# Patient Record
Sex: Female | Born: 1955 | Race: White | Hispanic: No | Marital: Married | Smoking: Former smoker
Health system: Southern US, Community
[De-identification: ages and names within clinical notes are randomized; demographics above are authoritative.]

## PROBLEM LIST (undated history)

## (undated) DIAGNOSIS — K259 Gastric ulcer, unspecified as acute or chronic, without hemorrhage or perforation: Secondary | ICD-10-CM

## (undated) DIAGNOSIS — R160 Hepatomegaly, not elsewhere classified: Secondary | ICD-10-CM

## (undated) DIAGNOSIS — J189 Pneumonia, unspecified organism: Secondary | ICD-10-CM

## (undated) DIAGNOSIS — F32A Depression, unspecified: Secondary | ICD-10-CM

## (undated) DIAGNOSIS — F1910 Other psychoactive substance abuse, uncomplicated: Secondary | ICD-10-CM

## (undated) DIAGNOSIS — F419 Anxiety disorder, unspecified: Secondary | ICD-10-CM

## (undated) DIAGNOSIS — K219 Gastro-esophageal reflux disease without esophagitis: Secondary | ICD-10-CM

## (undated) DIAGNOSIS — K469 Unspecified abdominal hernia without obstruction or gangrene: Secondary | ICD-10-CM

## (undated) DIAGNOSIS — R011 Cardiac murmur, unspecified: Secondary | ICD-10-CM

## (undated) DIAGNOSIS — Z87442 Personal history of urinary calculi: Secondary | ICD-10-CM

## (undated) DIAGNOSIS — Z8719 Personal history of other diseases of the digestive system: Secondary | ICD-10-CM

## (undated) DIAGNOSIS — M1990 Unspecified osteoarthritis, unspecified site: Secondary | ICD-10-CM

## (undated) DIAGNOSIS — F988 Other specified behavioral and emotional disorders with onset usually occurring in childhood and adolescence: Secondary | ICD-10-CM

## (undated) DIAGNOSIS — I10 Essential (primary) hypertension: Secondary | ICD-10-CM

## (undated) DIAGNOSIS — F329 Major depressive disorder, single episode, unspecified: Secondary | ICD-10-CM

## (undated) DIAGNOSIS — K5790 Diverticulosis of intestine, part unspecified, without perforation or abscess without bleeding: Secondary | ICD-10-CM

## (undated) HISTORY — PX: EYE SURGERY: SHX253

## (undated) HISTORY — DX: Depression, unspecified: F32.A

## (undated) HISTORY — DX: Other psychoactive substance abuse, uncomplicated: F19.10

## (undated) HISTORY — PX: ECTOPIC PREGNANCY SURGERY: SHX613

## (undated) HISTORY — DX: Cardiac murmur, unspecified: R01.1

## (undated) HISTORY — DX: Hepatomegaly, not elsewhere classified: R16.0

## (undated) HISTORY — PX: CHOLECYSTECTOMY: SHX55

## (undated) HISTORY — PX: ABDOMINAL HYSTERECTOMY: SHX81

## (undated) HISTORY — DX: Other specified behavioral and emotional disorders with onset usually occurring in childhood and adolescence: F98.8

## (undated) HISTORY — DX: Major depressive disorder, single episode, unspecified: F32.9

---

## 2006-03-09 ENCOUNTER — Other Ambulatory Visit: Admission: RE | Admit: 2006-03-09 | Discharge: 2006-03-09 | Payer: Self-pay | Admitting: Family Medicine

## 2011-07-09 ENCOUNTER — Encounter: Payer: Self-pay | Admitting: Internal Medicine

## 2011-07-23 ENCOUNTER — Encounter: Payer: Self-pay | Admitting: Internal Medicine

## 2011-07-24 ENCOUNTER — Ambulatory Visit: Payer: Self-pay | Admitting: Internal Medicine

## 2012-09-01 ENCOUNTER — Telehealth: Payer: Self-pay | Admitting: Nurse Practitioner

## 2012-09-01 NOTE — Telephone Encounter (Signed)
PLEASE ADVISE.

## 2012-09-01 NOTE — Telephone Encounter (Signed)
Need chart

## 2012-09-02 MED ORDER — METHYLPHENIDATE HCL ER (OSM) 36 MG PO TBCR: 36.0000 mg | EXTENDED_RELEASE_TABLET | ORAL | Status: DC

## 2012-09-02 NOTE — Telephone Encounter (Signed)
Chart on desk

## 2012-09-02 NOTE — Telephone Encounter (Signed)
Rx ready for pick up. 

## 2012-10-03 ENCOUNTER — Telehealth: Payer: Self-pay | Admitting: Nurse Practitioner

## 2012-10-04 MED ORDER — METHYLPHENIDATE HCL ER (OSM) 54 MG PO TBCR: 54.0000 mg | EXTENDED_RELEASE_TABLET | ORAL | Status: DC

## 2012-10-04 NOTE — Telephone Encounter (Signed)
Patient aware to pick up RX 

## 2012-10-04 NOTE — Telephone Encounter (Signed)
i cant find anywhere that says 54mg . Sent chart back

## 2012-11-07 ENCOUNTER — Telehealth: Payer: Self-pay | Admitting: Nurse Practitioner

## 2012-11-08 ENCOUNTER — Other Ambulatory Visit: Payer: Self-pay | Admitting: *Deleted

## 2012-11-08 MED ORDER — METHYLPHENIDATE HCL ER (OSM) 54 MG PO TBCR: 54.0000 mg | EXTENDED_RELEASE_TABLET | ORAL | Status: DC

## 2012-11-08 NOTE — Telephone Encounter (Signed)
Patient aware to pick up 

## 2012-11-08 NOTE — Telephone Encounter (Signed)
Last filled 10/04/12, last seen 0124/14, Chart says 36mg , request is for 54mg .

## 2012-11-08 NOTE — Telephone Encounter (Signed)
rx ready for pickup 

## 2012-11-14 ENCOUNTER — Telehealth: Payer: Self-pay | Admitting: Nurse Practitioner

## 2012-11-15 ENCOUNTER — Ambulatory Visit (INDEPENDENT_AMBULATORY_CARE_PROVIDER_SITE_OTHER): Payer: No Typology Code available for payment source | Admitting: Nurse Practitioner

## 2012-11-15 ENCOUNTER — Encounter: Payer: Self-pay | Admitting: Nurse Practitioner

## 2012-11-15 VITALS — BP 155/89 | HR 63 | Temp 98.5°F | Ht 65.25 in | Wt 156.0 lb

## 2012-11-15 DIAGNOSIS — N390 Urinary tract infection, site not specified: Secondary | ICD-10-CM

## 2012-11-15 DIAGNOSIS — F909 Attention-deficit hyperactivity disorder, unspecified type: Secondary | ICD-10-CM | POA: Insufficient documentation

## 2012-11-15 DIAGNOSIS — G510 Bell's palsy: Secondary | ICD-10-CM

## 2012-11-15 LAB — POCT UA - MICROSCOPIC ONLY: Yeast, UA: NEGATIVE

## 2012-11-15 LAB — POCT URINALYSIS DIPSTICK
Glucose, UA: NEGATIVE
Nitrite, UA: NEGATIVE
Urobilinogen, UA: NEGATIVE

## 2012-11-15 MED ORDER — METHYLPREDNISOLONE ACETATE 80 MG/ML IJ SUSP
80.0000 mg | Freq: Once | INTRAMUSCULAR | Status: AC
  Administered 2012-11-15: 80 mg via INTRAMUSCULAR

## 2012-11-15 MED ORDER — CIPROFLOXACIN HCL 500 MG PO TABS: 500.0000 mg | ORAL_TABLET | Freq: Two times a day (BID) | ORAL | Status: DC

## 2012-11-15 NOTE — Patient Instructions (Signed)
Bell's Palsy  Bell's palsy is a condition in which the muscles on one side of the face cannot move (paralysis). This is because the nerves in the face are paralyzed. It is most often thought to be caused by a virus. The virus causes swelling of the nerve that controls movement on one side of the face. The nerve travels through a tight space surrounded by bone. When the nerve swells, it can be compressed by the bone. This results in damage to the protective covering around the nerve. This damage interferes with how the nerve communicates with the muscles of the face. As a result, it can cause weakness or paralysis of the facial muscles.   Injury (trauma), tumor, and surgery may cause Bell's palsy, but most of the time the cause is unknown. It is a relatively common condition. It starts suddenly (abrupt onset) with the paralysis usually ending within 2 days. Bell's palsy is not dangerous. But because the eye does not close properly, you may need care to keep the eye from getting dry. This can include splinting (to keep the eye shut) or moistening with artificial tears. Bell's palsy very seldom occurs on both sides of the face at the same time.  SYMPTOMS    Eyebrow sagging.   Drooping of the eyelid and corner of the mouth.   Inability to close one eye.   Loss of taste on the front of the tongue.   Sensitivity to loud noises.  TREATMENT   The treatment is usually non-surgical. If the patient is seen within the first 24 to 48 hours, a short course of steroids may be prescribed, in an attempt to shorten the length of the condition. Antiviral medicines may also be used with the steroids, but it is unclear if they are helpful.   You will need to protect your eye, if you cannot close it. The cornea (clear covering over your eye) will become dry and can be damaged. Artificial tears can be used to keep your eye moist. Glasses or an eye patch should be worn to protect your eye.  PROGNOSIS   Recovery is variable, ranging  from days to months. Although the problem usually goes away completely (about 80% of cases resolve), predicting the outcome is impossible. Most people improve within 3 weeks of when the symptoms began. Improvement may continue for 3 to 6 months. A small number of people have moderate to severe weakness that is permanent.   HOME CARE INSTRUCTIONS    If your caregiver prescribed medication to reduce swelling in the nerve, use as directed. Do not stop taking the medication unless directed by your caregiver.   Use moisturizing eye drops as needed to prevent drying of your eye, as directed by your caregiver.   Protect your eye, as directed by your caregiver.   Use facial massage and exercises, as directed by your caregiver.   Perform your normal activities, and get your normal rest.  SEEK IMMEDIATE MEDICAL CARE IF:    There is pain, redness or irritation in the eye.   You or your child has an oral temperature above 102 F (38.9 C), not controlled by medicine.  MAKE SURE YOU:    Understand these instructions.   Will watch your condition.   Will get help right away if you are not doing well or get worse.  Document Released: 05/11/2005 Document Revised: 08/03/2011 Document Reviewed: 05/20/2009  Exit[employer] Patient Information 2014 [employer].

## 2012-11-15 NOTE — Progress Notes (Signed)
  Subjective:    Patient ID: Theresa Reyes, female    DOB: 1955/10/16, 57 y.o.   MRN: [id number]  HPI1. PAtient here today C/O dysuria and urinary frequency an durgecy- Started Sunday and has gradually gotten worse- Using AZO OTC which has helped. 2 Facial drooping on right side.- No left sided weakness- noticed this yesterday.   Review of Systems  Genitourinary: Positive for dysuria, urgency and frequency.  Neurological: Positive for facial asymmetry. Negative for tremors, weakness and numbness.       Objective:   Physical Exam  Constitutional: She is oriented to person, place, and time. She appears well-developed and well-nourished.  Cardiovascular: Normal rate and normal heart sounds.   Pulmonary/Chest: Effort normal and breath sounds normal.  Neurological: She is alert and oriented to person, place, and time. She has normal reflexes. A cranial nerve deficit (right lip drooping) is present.  Psychiatric: She has a normal mood and affect. Her behavior is normal. Judgment and thought content normal.   BP 155/89  Pulse 63  Temp(Src) 98.5 F (36.9 C) (Oral)  Ht 5' 5.25" (1.657 m)  Wt 156 lb (70.761 kg)  BMI 25.77 kg/m2  Results for orders placed in visit on 11/15/12  POCT URINALYSIS DIPSTICK      Result Value Range   Color, UA gold     Clarity, UA clear     Glucose, UA neg     Bilirubin, UA neg     Ketones, UA neg     Spec Grav, UA 1.010     Blood, UA large     pH, UA 6.0     Protein, UA 1+     Urobilinogen, UA negative     Nitrite, UA neg     Leukocytes, UA Trace    POCT UA - MICROSCOPIC ONLY      Result Value Range   WBC, Ur, HPF, POC 5-10     RBC, urine, microscopic 1-5     Bacteria, U Microscopic few     Mucus, UA neg     Epithelial cells, urine per micros occ     Crystals, Ur, HPF, POC neg     Casts, Ur, LPF, POC neg     Yeast, UA neg           Assessment & Plan:  1. UTI (urinary tract infection) Force fluids AZO over the counter X2 days RTO  prn Culture pending  - POCT urinalysis dipstick - POCT UA - Microscopic Only - ciprofloxacin (CIPRO) 500 MG tablet; Take 1 tablet (500 mg total) by mouth 2 (two) times daily.  Dispense: 14 tablet; Refill: 0  2. Bell's palsy Watch for worsening - methylPREDNISolone acetate (DEPO-MEDROL) injection 80 mg; Inject 1 mL (80 mg total) into the muscle once.  Mary-Margaret Daphine Deutscher, FNP

## 2012-11-24 ENCOUNTER — Other Ambulatory Visit: Payer: Self-pay | Admitting: Nurse Practitioner

## 2012-11-24 NOTE — Telephone Encounter (Signed)
Patient last seen 6-24 by MMM. Please advise. If approved please have nurse phone in to pharmacy

## 2012-11-28 ENCOUNTER — Other Ambulatory Visit: Payer: Self-pay | Admitting: Family Medicine

## 2012-11-28 MED ORDER — LORAZEPAM 0.5 MG PO TABS: 0.5000 mg | ORAL_TABLET | Freq: Three times a day (TID) | ORAL | Status: DC | PRN

## 2012-11-28 NOTE — Telephone Encounter (Signed)
Ativan refilled and will need to follow up in 3 months prior to needing another refill.

## 2012-12-13 ENCOUNTER — Telehealth: Payer: Self-pay | Admitting: Nurse Practitioner

## 2012-12-15 ENCOUNTER — Other Ambulatory Visit: Payer: Self-pay

## 2012-12-15 MED ORDER — METHYLPHENIDATE HCL ER (OSM) 54 MG PO TBCR: 54.0000 mg | EXTENDED_RELEASE_TABLET | ORAL | Status: DC

## 2012-12-15 NOTE — Telephone Encounter (Signed)
Last seen 11/15/12 MM  Last filled 11/08/12    If approved print and route to nurse

## 2013-02-08 ENCOUNTER — Other Ambulatory Visit: Payer: Self-pay | Admitting: Nurse Practitioner

## 2013-02-08 MED ORDER — METHYLPHENIDATE HCL ER (OSM) 54 MG PO TBCR: 54.0000 mg | EXTENDED_RELEASE_TABLET | ORAL | Status: DC

## 2013-02-08 NOTE — Telephone Encounter (Signed)
Last filled 12/18/12, last seen 11/15/12, will print call when ready

## 2013-02-08 NOTE — Telephone Encounter (Signed)
rx ready for pickup 

## 2013-06-15 ENCOUNTER — Other Ambulatory Visit: Payer: Self-pay | Admitting: Family Medicine

## 2013-06-19 ENCOUNTER — Other Ambulatory Visit: Payer: Self-pay | Admitting: Family Medicine

## 2013-06-19 NOTE — Telephone Encounter (Signed)
Please call in lorazepam.  

## 2013-06-19 NOTE — Telephone Encounter (Signed)
Called to [facility] 

## 2013-06-19 NOTE — Telephone Encounter (Signed)
Patient last seen in office on 11-15-12. Please advise. If approved please route to Pool B so nurse can phone in to pharmacy

## 2013-06-21 ENCOUNTER — Telehealth: Payer: Self-pay | Admitting: Nurse Practitioner

## 2013-06-21 NOTE — Telephone Encounter (Signed)
Called to [facility] with directions.

## 2013-06-22 ENCOUNTER — Other Ambulatory Visit: Payer: Self-pay | Admitting: Family Medicine

## 2013-06-29 ENCOUNTER — Encounter: Payer: Self-pay | Admitting: Nurse Practitioner

## 2013-06-29 ENCOUNTER — Ambulatory Visit: Payer: No Typology Code available for payment source | Admitting: Nurse Practitioner

## 2013-06-29 VITALS — BP 172/94 | HR 74 | Temp 97.0°F | Wt 149.0 lb

## 2013-06-29 DIAGNOSIS — K2970 Gastritis, unspecified, without bleeding: Secondary | ICD-10-CM

## 2013-06-29 DIAGNOSIS — F329 Major depressive disorder, single episode, unspecified: Secondary | ICD-10-CM

## 2013-06-29 DIAGNOSIS — F32A Depression, unspecified: Secondary | ICD-10-CM

## 2013-06-29 DIAGNOSIS — K2990 Gastroduodenitis, unspecified, without bleeding: Secondary | ICD-10-CM

## 2013-06-29 DIAGNOSIS — F3289 Other specified depressive episodes: Secondary | ICD-10-CM

## 2013-06-29 MED ORDER — OMEPRAZOLE 40 MG PO CPDR: 40.0000 mg | DELAYED_RELEASE_CAPSULE | Freq: Every day | ORAL | Status: DC

## 2013-06-29 MED ORDER — ESCITALOPRAM OXALATE 20 MG PO TABS: 20.0000 mg | ORAL_TABLET | Freq: Every day | ORAL | Status: DC

## 2013-06-29 NOTE — Progress Notes (Signed)
   Subjective:    Patient ID: Theresa Reyes, female    DOB: 30-Apr-1956, 58 y.o.   MRN: [id number]  HPI  Patient mom passed away in February 19, 2023 and patient quit her job to take mom home to care for her . Now that she has died patient has grieved herself to death. SHe cries all the time and feels that she has no purpose in life. SHe has also developed abdominal pain and vomiting daily- vomitus looks foamy. Weight down over 30 lbs. SHe started taking nexium 1 week ago.    Review of Systems  Constitutional: Negative.   HENT: Negative.   Respiratory: Negative.   Cardiovascular: Negative.   Gastrointestinal: Negative.   Genitourinary: Negative.   Psychiatric/Behavioral: Negative.   All other systems reviewed and are negative.       Objective:   Physical Exam  Constitutional: She appears well-developed and well-nourished.  Cardiovascular: Normal rate, regular rhythm and normal heart sounds.   Pulmonary/Chest: Effort normal and breath sounds normal.  Abdominal: Soft.  Psychiatric: Her speech is normal and behavior is normal. Judgment and thought content normal. Her mood appears anxious. Cognition and memory are normal.  Tearful throughout exam   BP 172/94  Pulse 74  Temp(Src) 97 F (36.1 C) (Oral)  Wt 149 lb (67.586 kg)        Assessment & Plan:  1. Depression Stress management Grief counseling encouraged Follow up in 3 weeks - escitalopram (LEXAPRO) 20 MG tablet; Take 1 tablet (20 mg total) by mouth daily.  Dispense: 30 tablet; Refill: 5  2. Gastritis Avoid spicy and fatty foods - omeprazole (PRILOSEC) 40 MG capsule; Take 1 capsule (40 mg total) by mouth daily.  Dispense: 30 capsule; Refill: [place], FNP

## 2013-06-29 NOTE — Patient Instructions (Addendum)
Stress Management Stress is a state of physical or mental tension that often results from changes in your life or normal routine. Some common causes of stress are:  Death of a loved one.  Injuries or severe illnesses.  Getting fired or changing jobs.  Moving into a new home. Other causes may be:  Sexual problems.  Business or financial losses.  Taking on a large debt.  Regular conflict with someone at home or at work.  Constant tiredness from lack of sleep. It is not just bad things that are stressful. It may be stressful to:  Win the lottery.  Get married.  Buy a new car. The amount of stress that can be easily tolerated varies from person to person. Changes generally cause stress, regardless of the types of change. Too much stress can affect your health. It may lead to physical or emotional problems. Too little stress (boredom) may also become stressful. SUGGESTIONS TO REDUCE STRESS:  Talk things over with your family and friends. It often is helpful to share your concerns and worries. If you feel your problem is serious, you may want to get help from a professional counselor.  Consider your problems one at a time instead of lumping them all together. Trying to take care of everything at once may seem impossible. List all the things you need to do and then start with the most important one. Set a goal to accomplish 2 or 3 things each day. If you expect to do too many in a single day you will naturally fail, causing you to feel even more stressed.  Do not use alcohol or drugs to relieve stress. Although you may feel better for a short time, they do not remove the problems that caused the stress. They can also be habit forming.  Exercise regularly - at least 3 times per week. Physical exercise can help to relieve that "uptight" feeling and will relax you.  The shortest distance between despair and hope is often a good night's sleep.  Go to bed and get up on time allowing  yourself time for appointments without being rushed.  Take a short "time-out" period from any stressful situation that occurs during the day. Close your eyes and take some deep breaths. Starting with the muscles in your face, tense them, hold it for a few seconds, then relax. Repeat this with the muscles in your neck, shoulders, hand, stomach, back and legs.  Take good care of yourself. Eat a balanced diet and get plenty of rest.  Schedule time for having fun. Take a break from your daily routine to relax. HOME CARE INSTRUCTIONS   Call if you feel overwhelmed by your problems and feel you can no longer manage them on your own.  Return immediately if you feel like hurting yourself or someone else. Document Released: 11/04/2000 Document Revised: 08/03/2011 Document Reviewed: 01/03/2013 [employer] Patient Information 2014 [employer].  

## 2013-07-04 ENCOUNTER — Emergency Department (HOSPITAL_COMMUNITY): Payer: Self-pay

## 2013-07-04 ENCOUNTER — Encounter (HOSPITAL_COMMUNITY): Payer: Self-pay | Admitting: Emergency Medicine

## 2013-07-04 ENCOUNTER — Emergency Department (HOSPITAL_COMMUNITY)
Admission: EM | Admit: 2013-07-04 | Discharge: 2013-07-04 | Disposition: A | Discharge status: Home / Self Care | Payer: Self-pay | Attending: Emergency Medicine | Admitting: Emergency Medicine

## 2013-07-04 DIAGNOSIS — F17200 Nicotine dependence, unspecified, uncomplicated: Secondary | ICD-10-CM | POA: Insufficient documentation

## 2013-07-04 DIAGNOSIS — R112 Nausea with vomiting, unspecified: Secondary | ICD-10-CM | POA: Insufficient documentation

## 2013-07-04 DIAGNOSIS — R1013 Epigastric pain: Secondary | ICD-10-CM | POA: Insufficient documentation

## 2013-07-04 DIAGNOSIS — Z79899 Other long term (current) drug therapy: Secondary | ICD-10-CM | POA: Insufficient documentation

## 2013-07-04 DIAGNOSIS — R6883 Chills (without fever): Secondary | ICD-10-CM | POA: Insufficient documentation

## 2013-07-04 DIAGNOSIS — F329 Major depressive disorder, single episode, unspecified: Secondary | ICD-10-CM | POA: Insufficient documentation

## 2013-07-04 DIAGNOSIS — R1310 Dysphagia, unspecified: Secondary | ICD-10-CM | POA: Insufficient documentation

## 2013-07-04 DIAGNOSIS — R197 Diarrhea, unspecified: Secondary | ICD-10-CM | POA: Insufficient documentation

## 2013-07-04 DIAGNOSIS — F3289 Other specified depressive episodes: Secondary | ICD-10-CM | POA: Insufficient documentation

## 2013-07-04 DIAGNOSIS — K5900 Constipation, unspecified: Secondary | ICD-10-CM | POA: Insufficient documentation

## 2013-07-04 LAB — CBC WITH DIFFERENTIAL/PLATELET
BASOS ABS: 0 10*3/uL (ref 0.0–0.1)
Basophils Relative: 0 % (ref 0–1)
EOS PCT: 0 % (ref 0–5)
Eosinophils Absolute: 0 10*3/uL (ref 0.0–0.7)
HCT: 41.9 % (ref 36.0–46.0)
Hemoglobin: 14.5 g/dL (ref 12.0–15.0)
LYMPHS ABS: 2.4 10*3/uL (ref 0.7–4.0)
Lymphocytes Relative: 34 % (ref 12–46)
MCH: 30 pg (ref 26.0–34.0)
MCHC: 34.6 g/dL (ref 30.0–36.0)
MCV: 86.7 fL (ref 78.0–100.0)
MONO ABS: 0.3 10*3/uL (ref 0.1–1.0)
Monocytes Relative: 5 % (ref 3–12)
Neutro Abs: 4.3 10*3/uL (ref 1.7–7.7)
Neutrophils Relative %: 61 % (ref 43–77)
PLATELETS: 277 10*3/uL (ref 150–400)
RBC: 4.83 MIL/uL (ref 3.87–5.11)
RDW: 12.9 % (ref 11.5–15.5)
WBC: 7 10*3/uL (ref 4.0–10.5)

## 2013-07-04 LAB — COMPREHENSIVE METABOLIC PANEL
ALT: 7 U/L (ref 0–35)
AST: 12 U/L (ref 0–37)
Albumin: 4 g/dL (ref 3.5–5.2)
Alkaline Phosphatase: 51 U/L (ref 39–117)
BUN: 9 mg/dL (ref 6–23)
CALCIUM: 9.3 mg/dL (ref 8.4–10.5)
CO2: 26 meq/L (ref 19–32)
CREATININE: 0.79 mg/dL (ref 0.50–1.10)
Chloride: 105 mEq/L (ref 96–112)
GLUCOSE: 95 mg/dL (ref 70–99)
Potassium: 4.3 mEq/L (ref 3.7–5.3)
Sodium: 143 mEq/L (ref 137–147)
TOTAL PROTEIN: 7.4 g/dL (ref 6.0–8.3)
Total Bilirubin: 0.4 mg/dL (ref 0.3–1.2)

## 2013-07-04 LAB — URINALYSIS, ROUTINE W REFLEX MICROSCOPIC
Bilirubin Urine: NEGATIVE
Glucose, UA: NEGATIVE mg/dL
Ketones, ur: 15 mg/dL — AB
LEUKOCYTES UA: NEGATIVE
NITRITE: NEGATIVE
PROTEIN: NEGATIVE mg/dL
SPECIFIC GRAVITY, URINE: 1.013 (ref 1.005–1.030)
UROBILINOGEN UA: 0.2 mg/dL (ref 0.0–1.0)
pH: 8 (ref 5.0–8.0)

## 2013-07-04 LAB — URINE MICROSCOPIC-ADD ON

## 2013-07-04 LAB — LIPASE, BLOOD: Lipase: 19 U/L (ref 11–59)

## 2013-07-04 MED ORDER — ONDANSETRON 4 MG PO TBDP: 4.0000 mg | ORAL_TABLET | Freq: Three times a day (TID) | ORAL | Status: DC | PRN

## 2013-07-04 MED ORDER — ONDANSETRON HCL 4 MG/2ML IJ SOLN
4.0000 mg | Freq: Once | INTRAMUSCULAR | Status: AC
  Administered 2013-07-04: 4 mg via INTRAVENOUS
  Filled 2013-07-04: qty 2

## 2013-07-04 MED ORDER — GI COCKTAIL ~~LOC~~
30.0000 mL | Freq: Once | ORAL | Status: AC
  Administered 2013-07-04: 30 mL via ORAL
  Filled 2013-07-04: qty 30

## 2013-07-04 NOTE — ED Provider Notes (Signed)
CSN: [id number]     Arrival date & time 07/04/13  1108 History   First MD Initiated Contact with Patient 07/04/13 1152     Chief Complaint  Patient presents with  . Abdominal Pain  . Emesis    HPI  Theresa Reyes is a 58 y.o. female with a PMH of ADD and depression who presents to the ED for evaluation of abdominal pain and vomiting.  History was provided by the patient. Patient states that she has had unchanged epigastric pain for the past 8 weeks. Her pain is located in the epigastric region with intermittent radiation up to her chest. She describes it as a "knawing" and burning sensation. She states that her pain is worse with eating. She states that she has had difficulty swallowing solid foods and has been drinking soup for a week.  She's has been to her PCP and has been prescribed Mylanta, nexium, and dexilant with no relief in her symptoms. She has constant nausea with intermittent episodes of vomiting. She had constipation last week which improved with MOM and she subsequently had diarrhea which has resolved. No dysuria, vaginal bleeding/discharge. Previous surgeries include ectopic surgery. She also reports depression due to the passing of her mother in September. She denies any SI/HI. She has also had increased anxiety and is taking Ativan for this. She also intermittently gets chills but denies any fevers. No SOB, dyspnea, or cough. No dizziness, headache, lightheadedness, back pain. Patient is a tobacco user.    Past Medical History  Diagnosis Date  . Depression   . ADD (attention deficit disorder)     adult   Past Surgical History  Procedure Laterality Date  . Ectopic pregnancy surgery     Family History  Problem Relation Age of Onset  . Hypertension Mother   . Cancer Father     colon   History  Substance Use Topics  . Smoking status: Current Every Day Smoker -- 1.00 packs/day    Types: Cigarettes  . Smokeless tobacco: Not on file  . Alcohol Use: No   OB History    Grav Para Term Preterm Abortions TAB SAB Ect Mult Living                 Review of Systems  Constitutional: Positive for chills and appetite change. Negative for fever, diaphoresis, activity change and fatigue.  HENT: Positive for trouble swallowing. Negative for congestion, rhinorrhea and sore throat.   Respiratory: Negative for cough and shortness of breath.   Cardiovascular: Negative for chest pain and leg swelling.  Gastrointestinal: Positive for nausea, vomiting, abdominal pain, diarrhea and constipation. Negative for blood in stool, abdominal distention, anal bleeding and rectal pain.  Genitourinary: Negative for dysuria, hematuria, decreased urine volume, vaginal bleeding, vaginal discharge, difficulty urinating, vaginal pain and pelvic pain.  Musculoskeletal: Negative for back pain and myalgias.  Neurological: Negative for dizziness, weakness, light-headedness and headaches.    Allergies  Review of patient's allergies indicates no known allergies.  Home Medications   Current Outpatient Rx  Name  Route  Sig  Dispense  Refill  . aluminum & magnesium hydroxide-simethicone (MYLANTA) 500-450-40 MG/5ML suspension   Oral   Take 10 mLs by mouth every 4 (four) hours as needed for indigestion.         . calcium carbonate (TUMS - DOSED IN MG ELEMENTAL CALCIUM) 500 MG chewable tablet   Oral   Chew 1 tablet by mouth 6 (six) times daily.         Marland Kitchen  dexlansoprazole (DEXILANT) 60 MG capsule   Oral   Take 60 mg by mouth daily.         Marland Kitchen escitalopram (LEXAPRO) 20 MG tablet   Oral   Take 1 tablet (20 mg total) by mouth daily.   30 tablet   5   . LORazepam (ATIVAN) 0.5 MG tablet   Oral   Take 0.5 mg by mouth every 8 (eight) hours as needed for anxiety.          BP 180/72  Pulse 68  Temp(Src) 98.4 F (36.9 C) (Oral)  Resp 18  SpO2 99%  Filed Vitals:   07/04/13 1515 07/04/13 1530 07/04/13 1545 07/04/13 1655  BP: 152/76 153/78 148/83 161/80  Pulse: 49 48 51 52  Temp:     98.2 F (36.8 C)  TempSrc:    Oral  Resp: 15 14 16 18   SpO2: 99% 97% 98% 98%    Physical Exam  Nursing note and vitals reviewed. Constitutional: She is oriented to person, place, and time. She appears well-developed and well-nourished. No distress.  HENT:  Head: Normocephalic and atraumatic.  Right Ear: External ear normal.  Left Ear: External ear normal.  Nose: Nose normal.  Mouth/Throat: Oropharynx is clear and moist. No oropharyngeal exudate.  Eyes: Conjunctivae are normal. Right eye exhibits no discharge. Left eye exhibits no discharge.  Neck: Normal range of motion. Neck supple.  Cardiovascular: Normal rate, regular rhythm and normal heart sounds.  Exam reveals no gallop and no friction rub.   No murmur heard. Pulmonary/Chest: Effort normal and breath sounds normal. No respiratory distress. She has no wheezes. She has no rales. She exhibits no tenderness.  Abdominal: Soft. She exhibits no distension and no mass. There is tenderness. There is no rebound and no guarding.  Epigastric and RUQ tenderness to palpation  Musculoskeletal: Normal range of motion. She exhibits no edema and no tenderness.  No CVA, flank or lumbar tenderness  Neurological: She is alert and oriented to person, place, and time.  Skin: Skin is warm and dry. She is not diaphoretic.    ED Course  Procedures (including critical care time) Labs Review Labs Reviewed  URINALYSIS, ROUTINE W REFLEX MICROSCOPIC - Abnormal; Notable for the following:    Hgb urine dipstick TRACE (*)    Ketones, ur 15 (*)    All other components within normal limits  URINE MICROSCOPIC-ADD ON - Abnormal; Notable for the following:    Squamous Epithelial / LPF FEW (*)    All other components within normal limits  CBC WITH DIFFERENTIAL  COMPREHENSIVE METABOLIC PANEL  LIPASE, BLOOD   Imaging Review No results found.  EKG Interpretation    Date/Time:  Tuesday July 04 2013 13:33:25 EST Ventricular Rate:  47 PR  Interval:  152 QRS Duration: 80 QT Interval:  438 QTC Calculation: 387 R Axis:   75 Text Interpretation:  Sinus bradycardia RSR' in V1 or V2, probably normal variant No previous tracing Confirmed by KNAPP  MD-J, JON ([id number]) on 07/04/2013 1:39:42 PM           Results for orders placed during the hospital encounter of 07/04/13  CBC WITH DIFFERENTIAL      Result Value Ref Range   WBC 7.0  4.0 - 10.5 K/uL   RBC 4.83  3.87 - 5.11 MIL/uL   Hemoglobin 14.5  12.0 - 15.0 g/dL   HCT 41.9  36.0 - 46.0 %   MCV 86.7  78.0 - 100.0 fL   MCH 30.0  26.0 - 34.0 pg   MCHC 34.6  30.0 - 36.0 g/dL   RDW 12.9  11.5 - 15.5 %   Platelets 277  150 - 400 K/uL   Neutrophils Relative % 61  43 - 77 %   Neutro Abs 4.3  1.7 - 7.7 K/uL   Lymphocytes Relative 34  12 - 46 %   Lymphs Abs 2.4  0.7 - 4.0 K/uL   Monocytes Relative 5  3 - 12 %   Monocytes Absolute 0.3  0.1 - 1.0 K/uL   Eosinophils Relative 0  0 - 5 %   Eosinophils Absolute 0.0  0.0 - 0.7 K/uL   Basophils Relative 0  0 - 1 %   Basophils Absolute 0.0  0.0 - 0.1 K/uL  COMPREHENSIVE METABOLIC PANEL      Result Value Ref Range   Sodium 143  137 - 147 mEq/L   Potassium 4.3  3.7 - 5.3 mEq/L   Chloride 105  96 - 112 mEq/L   CO2 26  19 - 32 mEq/L   Glucose, Bld 95  70 - 99 mg/dL   BUN 9  6 - 23 mg/dL   Creatinine, Ser 0.79  0.50 - 1.10 mg/dL   Calcium 9.3  8.4 - 10.5 mg/dL   Total Protein 7.4  6.0 - 8.3 g/dL   Albumin 4.0  3.5 - 5.2 g/dL   AST 12  0 - 37 U/L   ALT 7  0 - 35 U/L   Alkaline Phosphatase 51  39 - 117 U/L   Total Bilirubin 0.4  0.3 - 1.2 mg/dL   GFR calc non Af Amer >90  >90 mL/min   GFR calc Af Amer >90  >90 mL/min  LIPASE, BLOOD      Result Value Ref Range   Lipase 19  11 - 59 U/L  URINALYSIS, ROUTINE W REFLEX MICROSCOPIC      Result Value Ref Range   Color, Urine YELLOW  YELLOW   APPearance CLEAR  CLEAR   Specific Gravity, Urine 1.013  1.005 - 1.030   pH 8.0  5.0 - 8.0   Glucose, UA NEGATIVE  NEGATIVE mg/dL   Hgb urine  dipstick TRACE (*) NEGATIVE   Bilirubin Urine NEGATIVE  NEGATIVE   Ketones, ur 15 (*) NEGATIVE mg/dL   Protein, ur NEGATIVE  NEGATIVE mg/dL   Urobilinogen, UA 0.2  0.0 - 1.0 mg/dL   Nitrite NEGATIVE  NEGATIVE   Leukocytes, UA NEGATIVE  NEGATIVE  URINE MICROSCOPIC-ADD ON      Result Value Ref Range   Squamous Epithelial / LPF FEW (*) RARE   RBC / HPF 0-2  <3 RBC/hpf   Bacteria, UA RARE  RARE    DG Abd Acute W/Chest (Final result)  Result time: 07/04/13 13:22:33    Final result by Rad Results In Interface (07/04/13 13:22:33)    Narrative:   CLINICAL DATA: Epigastric pain  EXAM: ACUTE ABDOMEN SERIES (ABDOMEN 2 VIEW & CHEST 1 VIEW)  COMPARISON: None.  FINDINGS: Cardiac and mediastinal silhouettes are within normal limits.  The lungs are clear. No airspace consolidation, pulmonary edema, or pleural effusion. There is no pneumothorax.  A few gas-filled nondilated loops of bowel are seen scattered throughout the abdomen. No evidence of obstruction or ileus. No free intraperitoneal air. No soft tissue mass or abnormal calcification.  No acute osseous abnormality.  IMPRESSION: 1. Nonobstructive bowel gas pattern with no radiographic evidence of acute intra-abdominal process identified. 2. No acute cardiopulmonary process.  Electronically Signed By: Jeannine Boga M.D. On: 07/04/2013 13:22       US Abdomen Complete (Final result)  Result time: 07/04/13 16:20:19    Final result by Rad Results In Interface (07/04/13 16:20:19)    Narrative:   CLINICAL DATA: Right upper quadrant pain  EXAM: ULTRASOUND ABDOMEN COMPLETE  COMPARISON: None.  FINDINGS: Gallbladder:  No gallstones or wall thickening visualized. No sonographic Murphy sign noted.  Common bile duct:  Diameter: 4.8 mm  Liver:  No focal lesion identified. Within normal limits in parenchymal echogenicity.  IVC:  No abnormality visualized.  Pancreas:  Visualized portion  unremarkable.  Spleen:  Size and appearance within normal limits.  Right Kidney:  Length: 10.5 cm. Echogenicity within normal limits. No mass or hydronephrosis visualized.  Left Kidney:  Length: 10.1 cm. Echogenicity within normal limits. No mass or hydronephrosis visualized.  Abdominal aorta:  No aneurysm visualized.  Other findings:  None.  IMPRESSION: No acute abnormality noted.   Electronically Signed By: Inez Catalina M.D. On: 07/04/2013 16:20    MDM   Theresa Reyes is a 58 y.o. female with a PMH of ADD and depression who presents to the ED for evaluation of abdominal pain and vomiting.  Rechecks  2:15 PM = Nausea resolved. Mild pain persists. Ordering GI cocktail. Waiting for [place]  4:45 PM = Patient ambulating without difficulty to restroom. Pain completely resolved after GI cocktail. States "I haven't felt this good in the past 8 weeks."   4:50 PM = Patient able to tolerate PO liquids without difficulty.    Etiology of abdominal pain likely due to gastritis vs GERD. Patient had complete resolution of symptoms with the GI cocktail. She was able to tolerate PO liquids without difficulty or emesis. Labs unremarkable. Abdominal US and abdominal series showed no acute abnormalities. Abdominal exam benign. Patient encouraged to continue her home regiment of Mylanta, Dexilant, and Nexium and follow-up with her PCP. She likely will need to see a GI specialist, referral given. Patient encouraged to stop smoking, which may help her symptoms. Zofran prescribed to help with nausea. Return precautions, discharge instructions, and follow-up was discussed with the patient before discharge.      Discharge Medication List as of 07/04/2013  4:40 PM    START taking these medications   Details  ondansetron (ZOFRAN ODT) 4 MG disintegrating tablet Take 1 tablet (4 mg total) by mouth every 8 (eight) hours as needed for nausea., Starting 07/04/2013, Until Discontinued, Print         Final impressions: 1. Epigastric pain      Mercy Moore PA-C   This patient was discussed with Dr. Shirlee Latch, PA-C 07/05/13 [phone]

## 2013-07-04 NOTE — ED Notes (Signed)
Pt verbalizes Nausea, vomiting, epigastric pain, and GERD x 8 weeks

## 2013-07-04 NOTE — ED Notes (Signed)
MD at bedside. 

## 2013-07-04 NOTE — ED Notes (Addendum)
N/v/ d x 8 weeks saw her dr 1 week ago and placed on some meds for ulcer pt states she is getting worse cannot sleep or eat has the shakes her mother just died and has been having  Diff w/ that

## 2013-07-04 NOTE — ED Notes (Signed)
Pt return from US  

## 2013-07-04 NOTE — ED Notes (Signed)
Pt to XR

## 2013-07-04 NOTE — ED Notes (Signed)
Bed in Low [place] w/ brakes on and side rails up x 2. Call bell within reach of pt. Comfort measures offered.  

## 2013-07-04 NOTE — Discharge Instructions (Signed)
Continue mylanta, nexium and dexilant as directed  Eat small frequent meals - try a clear liquid diet for 24 hours Take zofran as needed for nausea Return to the emergency department if you develop any changing/worsening condition, chest pain, difficulty breathing, fever, repeated vomiting, signs of dehydration, feeling like your are going to pass out, blood in your stool/vomit, or any other concerns (please read additional information regarding your condition below)   Abdominal Pain, Adult Many things can cause abdominal pain. Usually, abdominal pain is not caused by a disease and will improve without treatment. It can often be observed and treated at home. Your health care provider will do a physical exam and possibly order blood tests and X-rays to help determine the seriousness of your pain. However, in many cases, more time must pass before a clear cause of the pain can be found. Before that point, your health care provider may not know if you need more testing or further treatment. HOME CARE INSTRUCTIONS  Monitor your abdominal pain for any changes. The following actions may help to alleviate any discomfort you are experiencing:  Only take over-the-counter or prescription medicines as directed by your health care provider.  Do not take laxatives unless directed to do so by your health care provider.  Try a clear liquid diet (broth, tea, or water) as directed by your health care provider. Slowly move to a bland diet as tolerated. SEEK MEDICAL CARE IF:  You have unexplained abdominal pain.  You have abdominal pain associated with nausea or diarrhea.  You have pain when you urinate or have a bowel movement.  You experience abdominal pain that wakes you in the night.  You have abdominal pain that is worsened or improved by eating food.  You have abdominal pain that is worsened with eating fatty foods. SEEK IMMEDIATE MEDICAL CARE IF:   Your pain does not go away within 2 hours.  You  have a fever.  You keep throwing up (vomiting).  Your pain is felt only in portions of the abdomen, such as the right side or the left lower portion of the abdomen.  You pass bloody or black tarry stools. MAKE SURE YOU:  Understand these instructions.   Will watch your condition.   Will get help right away if you are not doing well or get worse.  Document Released: 02/18/2005 Document Revised: 03/01/2013 Document Reviewed: 01/18/2013 [facility] Patient Information 2014 [place].  Gastroesophageal Reflux Disease, Adult Gastroesophageal reflux disease (GERD) happens when acid from your stomach flows up into the esophagus. When acid comes in contact with the esophagus, the acid causes soreness (inflammation) in the esophagus. Over time, GERD may create small holes (ulcers) in the lining of the esophagus. CAUSES   Increased body weight. This puts pressure on the stomach, making acid rise from the stomach into the esophagus.  Smoking. This increases acid production in the stomach.  Drinking alcohol. This causes decreased pressure in the lower esophageal sphincter (valve or ring of muscle between the esophagus and stomach), allowing acid from the stomach into the esophagus.  Late evening meals and a full stomach. This increases pressure and acid production in the stomach.  A malformed lower esophageal sphincter. Sometimes, no cause is found. SYMPTOMS   Burning pain in the lower part of the mid-chest behind the breastbone and in the mid-stomach area. This may occur twice a week or more often.  Trouble swallowing.  Sore throat.  Dry cough.  Asthma-like symptoms including chest tightness, shortness of  breath, or wheezing. DIAGNOSIS  Your caregiver may be able to diagnose GERD based on your symptoms. In some cases, X-rays and other tests may be done to check for complications or to check the condition of your stomach and esophagus. TREATMENT  Your caregiver may recommend  over-the-counter or prescription medicines to help decrease acid production. Ask your caregiver before starting or adding any new medicines.  HOME CARE INSTRUCTIONS   Change the factors that you can control. Ask your caregiver for guidance concerning weight loss, quitting smoking, and alcohol consumption.  Avoid foods and drinks that make your symptoms worse, such as:  Caffeine or alcoholic drinks.  Chocolate.  Peppermint or mint flavorings.  Garlic and onions.  Spicy foods.  Citrus fruits, such as oranges, lemons, or limes.  Tomato-based foods such as sauce, chili, salsa, and pizza.  Fried and fatty foods.  Avoid lying down for the 3 hours prior to your bedtime or prior to taking a nap.  Eat small, frequent meals instead of large meals.  Wear loose-fitting clothing. Do not wear anything tight around your waist that causes pressure on your stomach.  Raise the head of your bed 6 to 8 inches with wood blocks to help you sleep. Extra pillows will not help.  Only take over-the-counter or prescription medicines for pain, discomfort, or fever as directed by your caregiver.  Do not take aspirin, ibuprofen, or other nonsteroidal anti-inflammatory drugs (NSAIDs). SEEK IMMEDIATE MEDICAL CARE IF:   You have pain in your arms, neck, jaw, teeth, or back.  Your pain increases or changes in intensity or duration.  You develop nausea, vomiting, or sweating (diaphoresis).  You develop shortness of breath, or you faint.  Your vomit is green, yellow, black, or looks like coffee grounds or blood.  Your stool is red, bloody, or black. These symptoms could be signs of other problems, such as heart disease, gastric bleeding, or esophageal bleeding. MAKE SURE YOU:   Understand these instructions.  Will watch your condition.  Will get help right away if you are not doing well or get worse. Document Released: 02/18/2005 Document Revised: 08/03/2011 Document Reviewed:  11/28/2010 [facility] Patient Information 2014 [place], [place].   Emergency Department Resource Guide 1) Find a Doctor and Pay Out of Pocket Although you won't have to find out who is covered by your insurance plan, it is a good idea to ask around and get recommendations. You will then need to call the office and see if the doctor you have chosen will accept you as a new patient and what types of options they offer for patients who are self-pay. Some doctors offer discounts or will set up payment plans for their patients who do not have insurance, but you will need to ask so you aren't surprised when you get to your appointment.  2) Contact Your Local Health Department Not all health departments have doctors that can see patients for sick visits, but many do, so it is worth a call to see if yours does. If you don't know where your local health department is, you can check in your phone book. The CDC also has a tool to help you locate your state's health department, and many state websites also have listings of all of their local health departments.  3) Find a [facility] If your illness is not likely to be very severe or complicated, you may want to try a walk in clinic. These are popping up all over the country in pharmacies, drugstores, and  shopping centers. They're usually staffed by nurse practitioners or physician assistants that have been trained to treat common illnesses and complaints. They're usually fairly quick and inexpensive. However, if you have serious medical issues or chronic medical problems, these are probably not your best option.  No Primary Care Doctor: - Call Health Connect at  [phone] - they can help you locate a primary care doctor that  accepts your insurance, provides certain services, etc. - Physician Referral Service- [phone]  Chronic Pain Problems: Organization         Address  Phone   Notes  [facility]  [phone] Patients need to  be referred by their primary care doctor.   Medication Assistance: Organization         Address  Phone   Notes  [facility] Medication [facility] [address] [place], [place] [address] [phone] --Must be a resident of [facility] -- Must have NO insurance coverage whatsoever (no Medicaid/ Medicare, etc.) -- The pt. MUST have a primary care doctor that directs their care regularly and follows them in the community   [facility]  [phone]   [employer]  [phone]    Agencies that provide inexpensive medical care: Organization         Address  Phone   Notes  [place]  [phone]   [facility]    [phone]   [facility] [place], [place] [address] [phone]   [place] [address] [place] [phone]   Planned Parenthood    [phone]   [facility]    [phone]   [place] and [address], [place] Phone:  [phone], Fax:  [phone] Hours of Operation:  9 am - 6 pm, M-F.  Also accepts Medicaid/Medicare and self-pay.  [facility] for [address], [address], [place] Phone: [phone], Fax: [phone]. Hours of Operation:  8:30 am - 5:30 pm, M-F.  Also accepts Medicaid and self-pay.  [facility] [address], [place] Phone: [phone]   [place], [place], [place] [phone], Ext. [phone] Mondays & Thursdays: 7-9 AM.  First 15 patients are seen on a first come, first serve basis.    [place] Providers:  Organization         Address  Phone   Notes  [facility] [address], [place] [phone] Also accepts self-pay patients.  [facility] [address], [place]  [phone]   [place], [address], [place] [phone]   [facility] [address] [place] [phone]   Lucianne Lei [address], [place]   [phone] Only accepts [place] Access [place] patients after they have their name applied to their card.   Self-Pay (no insurance) in [facility]:  Organization         Address  Phone   Notes  Sickle Cell Patients, [facility] Internal Medicine [place] [phone]   [facility] [place] [phone]   [facility]gent [place]  [place] [address] [place], [place] [phone]  [facility]/Dr. Osei-Bonsu  [address], [place] or [address], [place] [phone] Phone number for both [place] and [place] locations is the same.  Urgent Medical and [facility] [address] [place] [phone]   [facility] [address], [place] or [address] [phone]   [facility] [address] [place] [phone], phone; [phone], fax Sees patients 1st and 3rd Saturday of every month.  Must not qualify for public or private insurance (i.e. Medicaid, Medicare, [place] Health Choice, Veterans' Benefits)  Household income should be no more than 200% of the poverty level The clinic cannot treat you if you are pregnant or think you are pregnant  Sexually transmitted diseases are not treated at the clinic.    Dental Care: Organization         Address  Phone  Notes  [facility] Department of [facility] [place] [phone] Accepts children up to age 15 who are enrolled in [place] or [place]; pregnant women with a Medicaid card; and children who have applied for Medicaid or [place] Health Choice, but were declined, whose parents can  pay a reduced fee at time of service.  [facility] Department of [facility]  [address], [place] [phone] Accepts children up to age 9 who are enrolled in [place] or [place]; pregnant women with a Medicaid card; and children who have applied for Medicaid or [place] Health Choice, but were declined, whose parents can pay a reduced fee at time of service.  [place] Adult Dental Access PROGRAM  [place] [phone] Patients are seen by appointment only. Walk-ins are not accepted. [place] will see patients 43 years of age and older. Monday - Tuesday (8am-5pm) Most Wednesdays (8:30-5pm) $30 per visit, cash only  [facility] Adult Dental Access PROGRAM  [address], [facility] [phone] Patients are seen by appointment only. Walk-ins are not accepted. [place] will see patients 46 years of age and older. One Wednesday Evening (Monthly: Volunteer Based).  $30 per visit, cash only  [place]  [phone] for adults; Children under age 23, call Graduate Pediatric Dentistry at [phone]. Children aged 65-14, please call [phone] to request a pediatric application.  Dental services are provided in all areas of dental care including fillings, crowns and bridges, complete and partial dentures, implants, gum treatment, root canals, and extractions. Preventive care is also provided. Treatment is provided to both adults and children. Patients are selected via a lottery and there is often a waiting list.   [facility] [address] [place]  [phone] [email or web address]   [facility] [address] [place], [place] [phone], Ext. [phone] Second and Fourth Thursday of each month, opens at 6:30 AM; Clinic ends at 9 AM.  Patients are seen on a first-come first-served basis, and a limited number are seen during each clinic.   [facility]  [address] Hillard Danker [place], [place] [phone]   Eligibility Requirements You must have lived in [place], [place], or [place] counties for at least the last three months.   You cannot be eligible for state or federal sponsored [employer], including [employer], [place], or [employer].   You generally cannot be eligible for healthcare insurance  through your employer.    How to apply: Eligibility screenings are held every Tuesday and Wednesday afternoon from 1:00 pm until 4:00 pm. You do not need an appointment for the interview!  [facility] [address] [place], [place] [phone]   [facility] Health Department  [phone]   [facility] Health Department  [phone]   [facility] Health Department  [phone]    Behavioral Health Resources in the Community: Intensive Outpatient Programs Organization         Address  Phone  Notes  [facility] Services [address], [place], [place] [phone]   [facility] Outpatient [address] [place], [place] [phone]   [facility]: Alcohol [facility] Drug [facility] [address] [place], [place]  [phone]   [facility] Mental Health [address]  [place], [place] [phone] or [phone]   Substance Abuse Resources Organization         Address  Phone  Notes  [facility] Drug Services  [phone]   [facility]  [phone]   [facility] [place]  [phone]   Floydene Flock  [phone]   Residential & Outpatient Substance Abuse Program  [phone]   Psychological Services Organization         Address  Phone  Notes  [facility]  [phone]   [facility] Services  [phone]   [facility] Mental Health [address] [place] [phone] or [phone]    Mobile Crisis Teams Organization         Address  Phone  Notes  [facility], Mobile Crisis Care Unit  [phone]    [facility]  [address] [place], [place] [phone]   Doristine Locks [address] [place] [phone]    Self-Help/Support Groups Organization         Address  Phone             Notes  [facility] [place] - variety of support groups  [phone]- [phone] Call for more information  Narcotics Anonymous (NA), Caring [facility] [address], [employer] [place]  2 meetings at this location   Statistician         Address  Phone  Notes  [facility] [address],    [place]  [phone]   [facility]  [address], [place] [address], [place], [place] [phone]   [facility] [address] [place], [place] [phone] Admissions: 8am-3pm M-F  [facility] [address],    [place], [place] [phone]   [facility] [address] [place], [place], [place] [phone]   [facility] [address]  [place], [place] [phone]   [facility] - Intensive Outpatient [address] [place] [address], [place], [place] [phone]   [facility] (Addiction [facility] [address] [place]  [place], [place] [phone] or [phone]   [facility] (RTS) [address] [place], [place] [phone] Accepts Medicaid  [facility] [address],  [place] [phone] Substance Abuse/Addiction Treatment   [facility] Organization         Address  Phone  Notes  [facility]  [phone]   Angie Fava, PhD [address] [place], [place]   [phone] or [phone]   [facility] Behavioral   [address] [place], [place] [phone]  [facility] [address] [place], [place] [phone] Insurance/Medicaid/sponsorship through [employer] and Families [address] [place], [place] [phone] [place], [place] [phone]    Dr. Adele Schilder  [phone]   [facility] Dept. 1) [address] [place] 2) [place] 3)  [place] [address], [place] [phone]  [phone]   [place] [phone] or [phone] (After Hours)

## 2013-07-04 NOTE — ED Notes (Signed)
Patient transported to Ultrasound 

## 2013-07-07 NOTE — ED Provider Notes (Signed)
Medical screening examination/treatment/procedure(s) were performed by non-physician practitioner and as supervising physician I was immediately available for consultation/collaboration.  EKG Interpretation    Date/Time:  Tuesday July 04 2013 13:33:25 EST Ventricular Rate:  47 PR Interval:  152 QRS Duration: 80 QT Interval:  438 QTC Calculation: 387 R Axis:   75 Text Interpretation:  Sinus bradycardia RSR' in V1 or V2, probably normal variant No previous tracing Confirmed by Berniece Abid  MD-J, Bertie Simien ([id number]) on 07/04/2013 1:39:42 PM              Kathalene Frames, MD 07/07/13 [id number]

## 2013-09-12 ENCOUNTER — Other Ambulatory Visit: Payer: Self-pay | Admitting: Family Medicine

## 2013-09-14 ENCOUNTER — Other Ambulatory Visit: Payer: Self-pay | Admitting: Family Medicine

## 2013-09-14 NOTE — Telephone Encounter (Signed)
Last seen 06/29/13 MMM  IF approved route to nurse to call into [facility]

## 2013-09-14 NOTE — Telephone Encounter (Signed)
Please call in ativan with 0 refills 

## 2013-09-18 ENCOUNTER — Other Ambulatory Visit: Payer: Self-pay | Admitting: Family Medicine

## 2013-09-18 NOTE — Telephone Encounter (Signed)
Please call in ativan with 1 refills 

## 2013-09-18 NOTE — Telephone Encounter (Signed)
Called in.

## 2013-09-18 NOTE — Telephone Encounter (Signed)
Last seen 06/29/13 MMM If approved route to nurse to call into [facility]

## 2013-09-19 ENCOUNTER — Encounter: Payer: Self-pay | Admitting: Family Medicine

## 2013-09-19 ENCOUNTER — Other Ambulatory Visit: Payer: Self-pay | Admitting: Nurse Practitioner

## 2013-09-19 MED ORDER — METHYLPHENIDATE HCL ER (OSM) 54 MG PO TBCR: 54.0000 mg | EXTENDED_RELEASE_TABLET | ORAL | Status: DC

## 2013-11-06 ENCOUNTER — Other Ambulatory Visit: Payer: Self-pay | Admitting: Nurse Practitioner

## 2013-11-06 MED ORDER — METHYLPHENIDATE HCL ER (OSM) 54 MG PO TBCR: 54.0000 mg | EXTENDED_RELEASE_TABLET | ORAL | Status: DC

## 2013-11-07 NOTE — Progress Notes (Signed)
Pt notified that rx is up front and ready to pick up

## 2013-11-20 ENCOUNTER — Ambulatory Visit (INDEPENDENT_AMBULATORY_CARE_PROVIDER_SITE_OTHER): Payer: BC Managed Care – PPO | Admitting: Family Medicine

## 2013-11-20 ENCOUNTER — Encounter: Payer: Self-pay | Admitting: Family Medicine

## 2013-11-20 VITALS — BP 139/83 | HR 55 | Temp 98.5°F | Ht 65.25 in | Wt 158.0 lb

## 2013-11-20 DIAGNOSIS — H8109 Meniere's disease, unspecified ear: Secondary | ICD-10-CM

## 2013-11-20 DIAGNOSIS — H81399 Other peripheral vertigo, unspecified ear: Secondary | ICD-10-CM

## 2013-11-20 MED ORDER — METHYLPREDNISOLONE (PAK) 4 MG PO TABS: ORAL_TABLET | ORAL | Status: DC

## 2013-11-20 MED ORDER — MECLIZINE HCL 25 MG PO TABS: 25.0000 mg | ORAL_TABLET | Freq: Three times a day (TID) | ORAL | Status: DC | PRN

## 2013-11-20 NOTE — Progress Notes (Signed)
   Subjective:    Patient ID: Theresa Reyes, female    DOB: Feb 15, 1956, 58 y.o.   MRN: [id number]  HPI This 58 y.o. female presents for evaluation of dizziness and nausea. Patient had sx's start yesterday and has been taking some of her husband's antivert and this has helped some but she is extremely dizzy and gets nauseated.  The antivert makes her sleepy.   Review of Systems C/o vertigo No chest pain, SOB, HA, vision change, N/V, diarrhea, constipation, dysuria, urinary urgency or frequency, myalgias, arthralgias or rash.     Objective:   Physical Exam  Vital signs noted  Well developed well nourished female.  HEENT - Head atraumatic Normocephalic                Eyes - PERRLA, Conjuctiva - clear Sclera- Clear EOMI                Ears - EAC's Wnl TM's Wnl Gross Hearing WNL                Throat - oropharanx wnl Respiratory - Lungs CTA bilateral Cardiac - RRR S1 and S2 without murmur GI - Abdomen soft Nontender and bowel sounds active x 4 Extremities - No edema. Neuro - Grossly intact.      Assessment & Plan:  Vertigo, labyrinthine, unspecified laterality - Plan: methylPREDNIsolone (MEDROL DOSPACK) 4 MG tablet, meclizine (ANTIVERT) 25 MG tablet NS one liter IV and discussed pushing po fluids and explained vertigo exercises  Lysbeth Penner FNP

## 2013-12-14 ENCOUNTER — Other Ambulatory Visit: Payer: Self-pay | Admitting: Nurse Practitioner

## 2013-12-14 MED ORDER — METHYLPHENIDATE HCL ER (OSM) 54 MG PO TBCR: 54.0000 mg | EXTENDED_RELEASE_TABLET | ORAL | Status: DC

## 2013-12-27 ENCOUNTER — Other Ambulatory Visit: Payer: Self-pay | Admitting: Nurse Practitioner

## 2014-01-17 ENCOUNTER — Other Ambulatory Visit: Payer: Self-pay | Admitting: Nurse Practitioner

## 2014-01-18 ENCOUNTER — Other Ambulatory Visit: Payer: Self-pay | Admitting: Nurse Practitioner

## 2014-01-18 MED ORDER — METHYLPHENIDATE HCL ER (OSM) 54 MG PO TBCR: 54.0000 mg | EXTENDED_RELEASE_TABLET | ORAL | Status: DC

## 2014-01-18 NOTE — Progress Notes (Signed)
Left message that prescription is available to pickup with photo ID and that she needs to schedule appt before additional refills will be given.

## 2014-03-21 ENCOUNTER — Telehealth: Payer: Self-pay | Admitting: Nurse Practitioner

## 2014-03-22 NOTE — Telephone Encounter (Signed)
Pt needs to be seen for refill on her concerta, pt works in [employer] and is off tomorrow. Wanted to be seen by mmm tomorrow as she isn't sure when she would be able to be seen due to work schedule, appt given with mmm tomorrow at 9:30

## 2014-03-23 ENCOUNTER — Encounter: Payer: Self-pay | Admitting: Nurse Practitioner

## 2014-03-23 ENCOUNTER — Ambulatory Visit (INDEPENDENT_AMBULATORY_CARE_PROVIDER_SITE_OTHER): Payer: BC Managed Care – PPO | Admitting: Nurse Practitioner

## 2014-03-23 VITALS — BP 154/84 | HR 56 | Temp 98.0°F | Ht 65.25 in | Wt 173.6 lb

## 2014-03-23 DIAGNOSIS — F329 Major depressive disorder, single episode, unspecified: Secondary | ICD-10-CM

## 2014-03-23 DIAGNOSIS — F32A Depression, unspecified: Secondary | ICD-10-CM

## 2014-03-23 DIAGNOSIS — F900 Attention-deficit hyperactivity disorder, predominantly inattentive type: Secondary | ICD-10-CM

## 2014-03-23 MED ORDER — LORAZEPAM 0.5 MG PO TABS: ORAL_TABLET | ORAL | Status: DC

## 2014-03-23 MED ORDER — LISDEXAMFETAMINE DIMESYLATE 60 MG PO CAPS: 60.0000 mg | ORAL_CAPSULE | ORAL | Status: DC

## 2014-03-23 MED ORDER — ESCITALOPRAM OXALATE 20 MG PO TABS: ORAL_TABLET | ORAL | Status: DC

## 2014-03-23 NOTE — Progress Notes (Signed)
   Subjective:    Patient ID: Theresa Reyes, female    DOB: 04/13/1956, 58 y.o.   MRN: [id number]  HPI Patient is here for ADHD follow up. She reported doing well but feels like the medication wear off early afternoon. She denies any side effects.    Review of Systems  All other systems reviewed and are negative.      Objective:   Physical Exam  Constitutional: She is oriented to person, place, and time. She appears well-developed and well-nourished.  HENT:  Head: Normocephalic.  Eyes: Pupils are equal, round, and reactive to light.  Neck: Normal range of motion.  Cardiovascular: Normal rate and regular rhythm.   Pulmonary/Chest: Effort normal.  Musculoskeletal: Normal range of motion.  Neurological: She is alert and oriented to person, place, and time.  Skin: Skin is warm and dry.  Psychiatric: She has a normal mood and affect.    BP 154/84  Pulse 56  Temp(Src) 98 F (36.7 C) (Oral)  Ht 5' 5.25" (1.657 m)  Wt 173 lb 9.6 oz (78.744 kg)  BMI 28.68 kg/m2       Assessment & Plan:  1. Depression Stress management - LORazepam (ATIVAN) 0.5 MG tablet; TAKE ONE TABLET BY MOUTH EVERY 8 HOURS AS NEEDED FOR ANXIETY  Dispense: 60 tablet; Refill: 1 - escitalopram (LEXAPRO) 20 MG tablet; TAKE 1 TABLET EVERY DAY  Dispense: 30 tablet; Refill: 2  2. Attention deficit hyperactivity disorder (ADHD), predominantly inattentive type Changed from concerta - lisdexamfetamine (VYVANSE) 60 MG capsule; Take 1 capsule (60 mg total) by mouth every morning.  Dispense: 30 capsule; Refill: 0   Labs pending Health maintenance reviewed Diet and exercise encouraged Continue all meds Follow up  In 3 months   [place], FNP

## 2014-03-23 NOTE — Patient Instructions (Signed)

## 2014-04-30 ENCOUNTER — Other Ambulatory Visit: Payer: Self-pay | Admitting: Nurse Practitioner

## 2014-05-02 ENCOUNTER — Other Ambulatory Visit: Payer: Self-pay | Admitting: Nurse Practitioner

## 2014-05-02 DIAGNOSIS — F900 Attention-deficit hyperactivity disorder, predominantly inattentive type: Secondary | ICD-10-CM

## 2014-05-02 MED ORDER — LISDEXAMFETAMINE DIMESYLATE 60 MG PO CAPS
60.0000 mg | ORAL_CAPSULE | ORAL | Status: DC
Start: 2014-05-02 — End: 2014-06-04

## 2014-05-02 NOTE — Telephone Encounter (Signed)
Aware, vyvance script ready.

## 2014-06-03 ENCOUNTER — Other Ambulatory Visit: Payer: Self-pay | Admitting: Nurse Practitioner

## 2014-06-03 DIAGNOSIS — F900 Attention-deficit hyperactivity disorder, predominantly inattentive type: Secondary | ICD-10-CM

## 2014-06-04 ENCOUNTER — Telehealth: Payer: Self-pay | Admitting: *Deleted

## 2014-06-04 MED ORDER — LISDEXAMFETAMINE DIMESYLATE 60 MG PO CAPS: 60.0000 mg | ORAL_CAPSULE | ORAL | Status: DC

## 2014-06-04 NOTE — Telephone Encounter (Signed)
vyvanse rx ready for pick up

## 2014-06-04 NOTE — Telephone Encounter (Signed)
Aware,script for vyvance is ready.

## 2014-06-04 NOTE — Telephone Encounter (Signed)
Last seen 03/23/2014 and last filled 05/02/2014

## 2014-07-06 ENCOUNTER — Other Ambulatory Visit: Payer: Self-pay | Admitting: Nurse Practitioner

## 2014-07-06 DIAGNOSIS — F900 Attention-deficit hyperactivity disorder, predominantly inattentive type: Secondary | ICD-10-CM

## 2014-07-06 MED ORDER — LISDEXAMFETAMINE DIMESYLATE 60 MG PO CAPS: 60.0000 mg | ORAL_CAPSULE | ORAL | Status: DC

## 2014-07-06 NOTE — Telephone Encounter (Signed)
vyvanse rx ready for pick up

## 2014-07-08 ENCOUNTER — Other Ambulatory Visit: Payer: Self-pay | Admitting: Nurse Practitioner

## 2014-07-10 ENCOUNTER — Telehealth: Payer: Self-pay

## 2014-07-10 ENCOUNTER — Telehealth: Payer: Self-pay | Admitting: Family Medicine

## 2014-07-10 MED ORDER — ESCITALOPRAM OXALATE 20 MG PO TABS: 20.0000 mg | ORAL_TABLET | Freq: Every day | ORAL | Status: DC

## 2014-07-10 NOTE — Telephone Encounter (Signed)
done

## 2014-07-11 ENCOUNTER — Other Ambulatory Visit: Payer: Self-pay

## 2014-07-11 NOTE — Telephone Encounter (Signed)
Last seen 03/23/14 MMM  No upcoming appt scheduled  Filled for 30 but insurance needs to be 3 month supply so they are requesting 90

## 2014-07-12 MED ORDER — ESCITALOPRAM OXALATE 20 MG PO TABS: 20.0000 mg | ORAL_TABLET | Freq: Every day | ORAL | Status: DC

## 2014-08-08 ENCOUNTER — Other Ambulatory Visit: Payer: Self-pay | Admitting: Nurse Practitioner

## 2014-08-08 DIAGNOSIS — F900 Attention-deficit hyperactivity disorder, predominantly inattentive type: Secondary | ICD-10-CM

## 2014-08-09 MED ORDER — LISDEXAMFETAMINE DIMESYLATE 60 MG PO CAPS: 60.0000 mg | ORAL_CAPSULE | ORAL | Status: DC

## 2014-08-09 NOTE — Telephone Encounter (Signed)
lmovm that written rx ready for pickup

## 2014-08-09 NOTE — Telephone Encounter (Signed)
vyvanse rx ready for pick up no more refills without being seen

## 2014-08-10 NOTE — Telephone Encounter (Signed)
Patient picked up rx on 3/17

## 2014-09-13 ENCOUNTER — Other Ambulatory Visit: Payer: Self-pay | Admitting: Nurse Practitioner

## 2014-09-13 DIAGNOSIS — F900 Attention-deficit hyperactivity disorder, predominantly inattentive type: Secondary | ICD-10-CM

## 2014-09-13 MED ORDER — LISDEXAMFETAMINE DIMESYLATE 60 MG PO CAPS: 60.0000 mg | ORAL_CAPSULE | ORAL | Status: DC

## 2014-09-28 ENCOUNTER — Encounter: Payer: Self-pay | Admitting: Nurse Practitioner

## 2014-09-28 ENCOUNTER — Ambulatory Visit (INDEPENDENT_AMBULATORY_CARE_PROVIDER_SITE_OTHER): Payer: BLUE CROSS/BLUE SHIELD | Admitting: Nurse Practitioner

## 2014-09-28 VITALS — BP 151/84 | HR 74 | Temp 97.3°F | Ht 62.0 in | Wt 183.0 lb

## 2014-09-28 DIAGNOSIS — Z0000 Encounter for general adult medical examination without abnormal findings: Secondary | ICD-10-CM | POA: Diagnosis not present

## 2014-09-28 DIAGNOSIS — F900 Attention-deficit hyperactivity disorder, predominantly inattentive type: Secondary | ICD-10-CM | POA: Diagnosis not present

## 2014-09-28 DIAGNOSIS — F329 Major depressive disorder, single episode, unspecified: Secondary | ICD-10-CM | POA: Diagnosis not present

## 2014-09-28 DIAGNOSIS — I10 Essential (primary) hypertension: Secondary | ICD-10-CM

## 2014-09-28 DIAGNOSIS — F32A Depression, unspecified: Secondary | ICD-10-CM

## 2014-09-28 LAB — POCT CBC
Granulocyte percent: 59.2 %G (ref 37–80)
HEMATOCRIT: 40.3 % (ref 37.7–47.9)
Hemoglobin: 12.9 g/dL (ref 12.2–16.2)
Lymph, poc: 2.4 (ref 0.6–3.4)
MCH: 28.2 pg (ref 27–31.2)
MCHC: 32.1 g/dL (ref 31.8–35.4)
MCV: 87.8 fL (ref 80–97)
MPV: 8.1 fL (ref 0–99.8)
PLATELET COUNT, POC: 262 10*3/uL (ref 142–424)
POC Granulocyte: 4.3 (ref 2–6.9)
POC LYMPH %: 33.5 % (ref 10–50)
RBC: 4.59 M/uL (ref 4.04–5.48)
RDW, POC: 13 %
WBC: 7.2 10*3/uL (ref 4.6–10.2)

## 2014-09-28 MED ORDER — LISDEXAMFETAMINE DIMESYLATE 60 MG PO CAPS: 60.0000 mg | ORAL_CAPSULE | ORAL | Status: DC

## 2014-09-28 MED ORDER — ESCITALOPRAM OXALATE 20 MG PO TABS: 20.0000 mg | ORAL_TABLET | Freq: Every day | ORAL | Status: DC

## 2014-09-28 MED ORDER — LISDEXAMFETAMINE DIMESYLATE 40 MG PO CAPS: 40.0000 mg | ORAL_CAPSULE | ORAL | Status: DC

## 2014-09-28 MED ORDER — LORAZEPAM 0.5 MG PO TABS
ORAL_TABLET | ORAL | Status: DC
Start: 2014-09-28 — End: 2016-08-10

## 2014-09-28 MED ORDER — LISINOPRIL 20 MG PO TABS: 20.0000 mg | ORAL_TABLET | Freq: Every day | ORAL | Status: DC

## 2014-09-28 NOTE — Progress Notes (Signed)
   Subjective:    Patient ID: Theresa Reyes, female    DOB: 1956-02-09, 59 y.o.   MRN: [id number]  HPI Patient here today for follow up and annual physical without pap: -ADHD- Patient takes vyvanse which really helps her to concentrate at work- She denies any side effects - Depression- on lexapo which is working well today- no side effects She is doing well today without complaints.    Review of Systems  Constitutional: Negative.   HENT: Negative.   Respiratory: Negative.   Cardiovascular: Negative.   Genitourinary: Negative.   Neurological: Negative.   Psychiatric/Behavioral: Negative.   All other systems reviewed and are negative.      Objective:   Physical Exam  Constitutional: She is oriented to person, place, and time. She appears well-developed and well-nourished.  HENT:  Head: Normocephalic.  Right Ear: Hearing, tympanic membrane, external ear and ear canal normal.  Left Ear: Hearing, tympanic membrane, external ear and ear canal normal.  Nose: Nose normal.  Mouth/Throat: Uvula is midline, oropharynx is clear and moist and mucous membranes are normal.  Eyes: Conjunctivae and EOM are normal. Pupils are equal, round, and reactive to light.  Neck: Normal range of motion. Neck supple. No JVD present. No thyromegaly present.  Cardiovascular: Normal rate, normal heart sounds and intact distal pulses.   No murmur heard. Pulmonary/Chest: Effort normal and breath sounds normal. She has no wheezes. She has no rales.  Abdominal: Soft. Bowel sounds are normal. She exhibits no mass.  Musculoskeletal: Normal range of motion.  Neurological: She is alert and oriented to person, place, and time. She has normal reflexes.  Skin: Skin is warm and dry.  Psychiatric: She has a normal mood and affect. Her behavior is normal. Judgment and thought content normal.   BP 151/84 mmHg  Pulse 74  Temp(Src) 97.3 F (36.3 C) (Oral)  Ht _0  (1.575 m)  Wt 183 lb (83.008 kg)  BMI 33.46  kg/m2        Assessment & Plan:  1. Depression Stress management - escitalopram (LEXAPRO) 20 MG tablet; Take 1 tablet (20 mg total) by mouth daily.  Dispense: 90 tablet; Refill: 0 - LORazepam (ATIVAN) 0.5 MG tablet; TAKE ONE TABLET BY MOUTH EVERY 8 HOURS AS NEEDED FOR ANXIETY  Dispense: 60 tablet; Refill: 1  2. Attention deficit hyperactivity disorder (ADHD), predominantly inattentive type - lisdexamfetamine (VYVANSE) 60 MG capsule; Take 1 capsule (60 mg total) by mouth every morning.  Dispense: 30 capsule; Refill: 0 - lisdexamfetamine (VYVANSE) 40 MG capsule; Take 1 capsule (40 mg total) by mouth every morning.  Dispense: 30 capsule; Refill: 0 - lisdexamfetamine (VYVANSE) 40 MG capsule; Take 1 capsule (40 mg total) by mouth every morning.  Dispense: 30 capsule; Refill: 0  3. Essential hypertension Do  Not add salt to diet - lisinopril (PRINIVIL,ZESTRIL) 20 MG tablet; Take 1 tablet (20 mg total) by mouth daily.  Dispense: 90 tablet; Refill: 3 - CMP14+EGFR - NMR, lipoprofile  4. Annual physical exam - POCT CBC - Thyroid Panel With TSH    Labs pending Health maintenance reviewed- refuses ALL health maintenance Diet and exercise encouraged Continue all meds Follow up  In 6 months   [place], FNP

## 2014-09-28 NOTE — Patient Instructions (Signed)

## 2014-09-29 LAB — NMR, LIPOPROFILE
Cholesterol: 220 mg/dL — ABNORMAL HIGH (ref 100–199)
HDL Cholesterol by NMR: 73 mg/dL (ref >39)
HDL Particle Number: 40.5 umol/L (ref >=30.5)
LDL Particle Number: 1240 nmol/L — ABNORMAL HIGH (ref <1000)
LDL Size: 21.7 nm (ref >20.5)
LDL-C: 121 mg/dL — AB (ref 0–99)
LP-IR Score: 36 (ref <=45)
Small LDL Particle Number: 222 nmol/L (ref <=527)
Triglycerides by NMR: 128 mg/dL (ref 0–149)

## 2014-09-29 LAB — CMP14+EGFR
ALT: 28 IU/L (ref 0–32)
AST: 23 IU/L (ref 0–40)
Albumin/Globulin Ratio: 1.6 (ref 1.1–2.5)
Albumin: 4.1 g/dL (ref 3.5–5.5)
Alkaline Phosphatase: 54 IU/L (ref 39–117)
BUN / CREAT RATIO: 17 (ref 9–23)
BUN: 11 mg/dL (ref 6–24)
Bilirubin Total: <0.2 mg/dL (ref 0.0–1.2)
CHLORIDE: 102 mmol/L (ref 97–108)
CO2: 25 mmol/L (ref 18–29)
CREATININE: 0.64 mg/dL (ref 0.57–1.00)
Calcium: 9.6 mg/dL (ref 8.7–10.2)
GFR, EST AFRICAN AMERICAN: 114 mL/min/{1.73_m2} (ref >59)
GFR, EST NON AFRICAN AMERICAN: 99 mL/min/{1.73_m2} (ref >59)
Globulin, Total: 2.6 g/dL (ref 1.5–4.5)
Glucose: 81 mg/dL (ref 65–99)
Potassium: 4.5 mmol/L (ref 3.5–5.2)
Sodium: 141 mmol/L (ref 134–144)
TOTAL PROTEIN: 6.7 g/dL (ref 6.0–8.5)

## 2014-09-29 LAB — THYROID PANEL WITH TSH
Free Thyroxine Index: 1.8 (ref 1.2–4.9)
T3 Uptake Ratio: 26 % (ref 24–39)
T4, Total: 7.1 ug/dL (ref 4.5–12.0)
TSH: 1.38 u[IU]/mL (ref 0.450–4.500)

## 2014-11-10 ENCOUNTER — Encounter: Payer: Self-pay | Admitting: Nurse Practitioner

## 2014-11-10 ENCOUNTER — Other Ambulatory Visit: Payer: Self-pay | Admitting: Family

## 2014-11-10 MED ORDER — LISDEXAMFETAMINE DIMESYLATE 60 MG PO CAPS: 60.0000 mg | ORAL_CAPSULE | ORAL | Status: DC

## 2015-01-15 ENCOUNTER — Other Ambulatory Visit: Payer: Self-pay | Admitting: Family

## 2015-01-15 MED ORDER — LISDEXAMFETAMINE DIMESYLATE 60 MG PO CAPS: 60.0000 mg | ORAL_CAPSULE | ORAL | Status: DC

## 2015-01-15 NOTE — Telephone Encounter (Signed)
vyvanse rx ready for pick up

## 2015-02-11 ENCOUNTER — Other Ambulatory Visit: Payer: Self-pay | Admitting: Nurse Practitioner

## 2015-02-11 ENCOUNTER — Ambulatory Visit (INDEPENDENT_AMBULATORY_CARE_PROVIDER_SITE_OTHER): Payer: BLUE CROSS/BLUE SHIELD | Admitting: Nurse Practitioner

## 2015-02-11 ENCOUNTER — Encounter: Payer: Self-pay | Admitting: Nurse Practitioner

## 2015-02-11 VITALS — BP 170/85 | HR 61 | Temp 97.3°F | Ht 62.0 in | Wt 176.0 lb

## 2015-02-11 DIAGNOSIS — M5441 Lumbago with sciatica, right side: Secondary | ICD-10-CM | POA: Diagnosis not present

## 2015-02-11 MED ORDER — LISDEXAMFETAMINE DIMESYLATE 60 MG PO CAPS: 60.0000 mg | ORAL_CAPSULE | ORAL | Status: DC

## 2015-02-11 MED ORDER — KETOROLAC TROMETHAMINE 60 MG/2ML IM SOLN
60.0000 mg | Freq: Once | INTRAMUSCULAR | Status: AC
  Administered 2015-02-11: 60 mg via INTRAMUSCULAR

## 2015-02-11 MED ORDER — PREDNISONE 20 MG PO TABS: ORAL_TABLET | ORAL | Status: DC

## 2015-02-11 MED ORDER — CYCLOBENZAPRINE HCL 10 MG PO TABS: 10.0000 mg | ORAL_TABLET | Freq: Three times a day (TID) | ORAL | Status: DC | PRN

## 2015-02-11 NOTE — Progress Notes (Signed)
  Subjective:    Theresa Reyes is a 59 y.o. female who presents for evaluation of low back pain. The patient has had no prior back problems. Symptoms have been present for 4 days and are unchanged.  Onset was related to / precipitated by a twisting movement. The pain is located in the right lumbar area and does not radiate. The pain is described as sharp and stabbing and occurs all day. She rates her pain as severe. Symptoms are exacerbated by lying down. Symptoms are not improved by acetaminophen, change in body position, ice, NSAIDs and rest. She has also tried acetaminophen, change in body position, ice and rest which provided no symptom relief. She has Numbness to the right hand, mostly thumb associated with the back pain. The patient has no "red flag" history indicative of complicated back pain.  The following portions of the patient's history were reviewed and updated as appropriate: allergies, current medications, past family history, past medical history, past social history, past surgical history and problem list.  Review of Systems Pertinent items are noted in HPI.    Objective:   Inspection and palpation: inspection of back is normal. Muscle tone and ROM exam: muscle tone normal without spasm, full range of motion with pain. Straight leg raise: positive at 45 degrees on the right. Neurological: normal DTRs, muscle strength and reflexes.    Assessment:    Nonspecific acute low back pain    Plan:       1. Right-sided low back pain with right-sided sciatica    Meds ordered this encounter  Medications  . predniSONE (DELTASONE) 20 MG tablet    Sig: 2 po at sametime daily for 5 days    Dispense:  10 tablet    Refill:  0    Order Specific Question:  Supervising Provider    Answer:  Chipper Herb [[id number]]  . cyclobenzaprine (FLEXERIL) 10 MG tablet    Sig: Take 1 tablet (10 mg total) by mouth 3 (three) times daily as needed for muscle spasms.    Dispense:  30 tablet    Refill:   1    Order Specific Question:  Supervising Provider    Answer:  Chipper Herb [[id number]]  . ketorolac (TORADOL) injection 60 mg    Sig:    No heavy lifting Moist heat RTO prn  Mary-Margaret Hassell Done, FNP

## 2015-02-11 NOTE — Patient Instructions (Signed)
Back Pain, Adult Low back pain is very common. About 1 in 5 people have back pain.The cause of low back pain is rarely dangerous. The pain often gets better over time.About half of people with a sudden onset of back pain feel better in just 2 weeks. About 8 in 10 people feel better by 6 weeks.  CAUSES Some common causes of back pain include:  Strain of the muscles or ligaments supporting the spine.  Wear and tear (degeneration) of the spinal discs.  Arthritis.  Direct injury to the back. DIAGNOSIS Most of the time, the direct cause of low back pain is not known.However, back pain can be treated effectively even when the exact cause of the pain is unknown.Answering your caregiver's questions about your overall health and symptoms is one of the most accurate ways to make sure the cause of your pain is not dangerous. If your caregiver needs more information, he or she may order lab work or imaging tests (X-rays or MRIs).However, even if imaging tests show changes in your back, this usually does not require surgery. HOME CARE INSTRUCTIONS For many people, back pain returns.Since low back pain is rarely dangerous, it is often a condition that people can learn to manageon their own.   Remain active. It is stressful on the back to sit or stand in one place. Do not sit, drive, or stand in one place for more than 30 minutes at a time. Take short walks on level surfaces as soon as pain allows.Try to increase the length of time you walk each day.  Do not stay in bed.Resting more than 1 or 2 days can delay your recovery.  Do not avoid exercise or work.Your body is made to move.It is not dangerous to be active, even though your back may hurt.Your back will likely heal faster if you return to being active before your pain is gone.  Pay attention to your body when you bend and lift. Many people have less discomfortwhen lifting if they bend their knees, keep the load close to their bodies,and  avoid twisting. Often, the most comfortable positions are those that put less stress on your recovering back.  Find a comfortable position to sleep. Use a firm mattress and lie on your side with your knees slightly bent. If you lie on your back, put a pillow under your knees.  Only take over-the-counter or prescription medicines as directed by your caregiver. Over-the-counter medicines to reduce pain and inflammation are often the most helpful.Your caregiver may prescribe muscle relaxant drugs.These medicines help dull your pain so you can more quickly return to your normal activities and healthy exercise.  Put ice on the injured area.  Put ice in a plastic bag.  Place a towel between your skin and the bag.  Leave the ice on for 15-20 minutes, 03-04 times a day for the first 2 to 3 days. After that, ice and heat may be alternated to reduce pain and spasms.  Ask your caregiver about trying back exercises and gentle massage. This may be of some benefit.  Avoid feeling anxious or stressed.Stress increases muscle tension and can worsen back pain.It is important to recognize when you are anxious or stressed and learn ways to manage it.Exercise is a great option. SEEK MEDICAL CARE IF:  You have pain that is not relieved with rest or medicine.  You have pain that does not improve in 1 week.  You have new symptoms.  You are generally not feeling well. SEEK   IMMEDIATE MEDICAL CARE IF:   You have pain that radiates from your back into your legs.  You develop new bowel or bladder control problems.  You have unusual weakness or numbness in your arms or legs.  You develop nausea or vomiting.  You develop abdominal pain.  You feel faint. Document Released: 05/11/2005 Document Revised: 11/10/2011 Document Reviewed: 09/12/2013 Exit[employer] Patient Information 2015 [employer]. This information is not intended to replace advice given to you by your health care provider. Make sure you  discuss any questions you have with your health care provider.  

## 2015-02-28 ENCOUNTER — Ambulatory Visit (INDEPENDENT_AMBULATORY_CARE_PROVIDER_SITE_OTHER): Payer: BLUE CROSS/BLUE SHIELD | Admitting: Family

## 2015-02-28 ENCOUNTER — Encounter: Payer: Self-pay | Admitting: Family

## 2015-02-28 VITALS — BP 152/83 | HR 75 | Temp 97.7°F | Ht 62.0 in | Wt 178.8 lb

## 2015-02-28 DIAGNOSIS — L237 Allergic contact dermatitis due to plants, except food: Secondary | ICD-10-CM | POA: Diagnosis not present

## 2015-02-28 MED ORDER — TRIAMCINOLONE ACETONIDE 0.5 % EX OINT: 1.0000 "application " | TOPICAL_OINTMENT | Freq: Two times a day (BID) | CUTANEOUS | Status: DC

## 2015-02-28 MED ORDER — METHYLPREDNISOLONE ACETATE 80 MG/ML IJ SUSP
80.0000 mg | Freq: Once | INTRAMUSCULAR | Status: AC
  Administered 2015-02-28: 80 mg via INTRAMUSCULAR

## 2015-02-28 NOTE — Patient Instructions (Signed)

## 2015-02-28 NOTE — Progress Notes (Signed)
   Subjective:    Patient ID: Theresa Reyes, female    DOB: 08/16/1955, 59 y.o.   MRN: [id number]  Rash This is a new problem. The current episode started in the past 7 days. The problem has been waxing and waning since onset. The affected locations include the left arm and right arm. The rash is characterized by redness and itchiness. She was exposed to plant contact. Pertinent negatives include no congestion, cough, diarrhea or shortness of breath. Past treatments include anti-itch cream, antihistamine and moisturizer. The treatment provided mild relief.      Review of Systems  Constitutional: Negative.   HENT: Negative.  Negative for congestion.   Eyes: Negative.   Respiratory: Negative.  Negative for cough and shortness of breath.   Cardiovascular: Negative.  Negative for palpitations.  Gastrointestinal: Negative.  Negative for diarrhea.  Endocrine: Negative.   Genitourinary: Negative.   Musculoskeletal: Negative.   Skin: Positive for rash.  Neurological: Negative.  Negative for headaches.  Hematological: Negative.   Psychiatric/Behavioral: Negative.   All other systems reviewed and are negative.      Objective:   Physical Exam  Constitutional: She is oriented to person, place, and time. She appears well-developed and well-nourished. No distress.  HENT:  Head: Normocephalic and atraumatic.  Eyes: Pupils are equal, round, and reactive to light.  Neck: Normal range of motion. Neck supple. No thyromegaly present.  Cardiovascular: Normal rate, regular rhythm, normal heart sounds and intact distal pulses.   No murmur heard. Pulmonary/Chest: Effort normal and breath sounds normal. No respiratory distress. She has no wheezes.  Abdominal: Soft. Bowel sounds are normal. She exhibits no distension. There is no tenderness.  Musculoskeletal: Normal range of motion. She exhibits no edema or tenderness.  Neurological: She is alert and oriented to person, place, and time. She has normal  reflexes. No cranial nerve deficit.  Skin: Skin is warm and dry. Rash noted. There is erythema.  Generalized erythemas pruritic rash on bilateral arms   Psychiatric: She has a normal mood and affect. Her behavior is normal. Judgment and thought content normal.  Vitals reviewed.     BP 152/83 mmHg  Pulse 75  Temp(Src) 97.7 F (36.5 C) (Oral)  Ht 5\' 2"  (1.575 m)  Wt 178 lb 12.8 oz (81.103 kg)  BMI 32.69 kg/m2     Assessment & Plan:  1. Contact dermatitis due to poison ivy -Do not scratch  -Keep area clean and dry -Wear protective clothing while outside- Long sleeves and long pants clothing -Take a shower as soon as possible after being outside - methylPREDNISolone acetate (DEPO-MEDROL) injection 80 mg; Inject 1 mL (80 mg total) into the muscle once. - triamcinolone ointment (KENALOG) 0.5 %; Apply 1 application topically 2 (two) times daily.  Dispense: 30 g; Refill: 0  Evelina Dun, FNP

## 2015-03-12 ENCOUNTER — Telehealth: Payer: Self-pay | Admitting: Nurse Practitioner

## 2015-03-18 ENCOUNTER — Other Ambulatory Visit: Payer: Self-pay | Admitting: Nurse Practitioner

## 2015-03-18 DIAGNOSIS — F900 Attention-deficit hyperactivity disorder, predominantly inattentive type: Secondary | ICD-10-CM

## 2015-03-18 MED ORDER — LISDEXAMFETAMINE DIMESYLATE 60 MG PO CAPS: 60.0000 mg | ORAL_CAPSULE | ORAL | Status: DC

## 2015-03-19 NOTE — Progress Notes (Signed)
Patient informed, mandy to put script up front for pickup

## 2015-04-20 ENCOUNTER — Other Ambulatory Visit: Payer: Self-pay | Admitting: Nurse Practitioner

## 2015-04-23 ENCOUNTER — Other Ambulatory Visit: Payer: Self-pay | Admitting: Nurse Practitioner

## 2015-04-23 DIAGNOSIS — F900 Attention-deficit hyperactivity disorder, predominantly inattentive type: Secondary | ICD-10-CM

## 2015-04-23 MED ORDER — LISDEXAMFETAMINE DIMESYLATE 60 MG PO CAPS: 60.0000 mg | ORAL_CAPSULE | ORAL | Status: DC

## 2015-04-23 NOTE — Telephone Encounter (Signed)
Patient aware that rx is ready to be picked up.  

## 2015-04-23 NOTE — Telephone Encounter (Signed)
vyvanse rx ready for pick up  

## 2015-05-26 HISTORY — PX: CHOLECYSTECTOMY: SHX55

## 2015-05-26 HISTORY — PX: OPEN PARTIAL HEPATECTOMY [83]: SHX5987

## 2015-07-03 ENCOUNTER — Telehealth: Payer: Self-pay | Admitting: Nurse Practitioner

## 2015-07-03 NOTE — Telephone Encounter (Signed)
Patient son's states that he has called 911 and they are taking her to the hospital.

## 2015-07-09 ENCOUNTER — Telehealth: Payer: Self-pay | Admitting: Nurse Practitioner

## 2015-07-11 DIAGNOSIS — R160 Hepatomegaly, not elsewhere classified: Secondary | ICD-10-CM | POA: Insufficient documentation

## 2015-07-19 ENCOUNTER — Other Ambulatory Visit: Payer: Self-pay | Admitting: Family

## 2015-07-19 NOTE — Telephone Encounter (Signed)
Last seen 02/28/15  [facility]

## 2015-07-29 DIAGNOSIS — R1011 Right upper quadrant pain: Secondary | ICD-10-CM | POA: Insufficient documentation

## 2015-08-08 DIAGNOSIS — F419 Anxiety disorder, unspecified: Secondary | ICD-10-CM | POA: Insufficient documentation

## 2015-08-19 ENCOUNTER — Encounter: Payer: Self-pay | Admitting: Pediatrics

## 2015-08-19 ENCOUNTER — Ambulatory Visit (INDEPENDENT_AMBULATORY_CARE_PROVIDER_SITE_OTHER): Payer: Commercial Managed Care - PPO | Admitting: Pediatrics

## 2015-08-19 VITALS — BP 126/82 | HR 72 | Temp 98.9°F | Ht 62.0 in | Wt 178.0 lb

## 2015-08-19 DIAGNOSIS — R399 Unspecified symptoms and signs involving the genitourinary system: Secondary | ICD-10-CM | POA: Diagnosis not present

## 2015-08-19 DIAGNOSIS — N390 Urinary tract infection, site not specified: Secondary | ICD-10-CM | POA: Diagnosis not present

## 2015-08-19 DIAGNOSIS — I10 Essential (primary) hypertension: Secondary | ICD-10-CM

## 2015-08-19 DIAGNOSIS — B370 Candidal stomatitis: Secondary | ICD-10-CM | POA: Diagnosis not present

## 2015-08-19 DIAGNOSIS — R160 Hepatomegaly, not elsewhere classified: Secondary | ICD-10-CM

## 2015-08-19 DIAGNOSIS — R8281 Pyuria: Secondary | ICD-10-CM

## 2015-08-19 LAB — MICROSCOPIC EXAMINATION

## 2015-08-19 LAB — URINALYSIS, COMPLETE
Bilirubin, UA: NEGATIVE
Glucose, UA: NEGATIVE
Ketones, UA: NEGATIVE
Nitrite, UA: NEGATIVE
PH UA: 8.5 — AB (ref 5.0–7.5)
Protein, UA: NEGATIVE
Specific Gravity, UA: 1.015 (ref 1.005–1.030)
Urobilinogen, Ur: 0.2 mg/dL (ref 0.2–1.0)

## 2015-08-19 MED ORDER — NYSTATIN 100000 UNIT/ML MT SUSP: 5.0000 mL | Freq: Four times a day (QID) | OROMUCOSAL | Status: DC

## 2015-08-19 MED ORDER — NITROFURANTOIN MONOHYD MACRO 100 MG PO CAPS: 100.0000 mg | ORAL_CAPSULE | Freq: Two times a day (BID) | ORAL | Status: DC

## 2015-08-19 MED ORDER — MAGIC MOUTHWASH W/LIDOCAINE: ORAL | Status: DC

## 2015-08-19 MED ORDER — FLUCONAZOLE 100 MG PO TABS: ORAL_TABLET | ORAL | Status: DC

## 2015-08-19 NOTE — Progress Notes (Signed)
Subjective:    Patient ID: Theresa Reyes, female    DOB: 1956/02/06, 60 y.o.   MRN: [id number]  CC: Thrush; Sore Throat; Urinary Urgency; and Urinary Incontinence   HPI: Theresa Reyes is a 59 y.o. female presenting for [place]; Sore Throat; Urinary Urgency; and Urinary Incontinence  Sore throat: thinks she has thrush, sore since she came home from hospital   UTI: started having dysuria 5 days ago, urinary urgency, urinary frequency, sometimes not making it to bathroom No fevers. Had catheter following the surgery  Recent surgery to remove mass from liver, discharged 3 days ago. pathlogy not back yet. Sore, but healing well.   Feels sore, tired, achey, with sore throat Some congestion Some hip pain Some back pain No fevers   Relevant past medical, surgical, family and social history reviewed and updated as indicated. Interim medical history since our last visit reviewed. Allergies and medications reviewed and updated.    ROS: Per HPI unless specifically indicated above  History  Smoking status  . Current Every Day Smoker -- 1.00 packs/day  . Types: Cigarettes  Smokeless tobacco  . Not on file    Past Medical History Patient Active Problem List   Diagnosis Date Noted  . Anxiety 08/08/2015  . Current smoker 07/29/2015  . Abdominal pain, right upper quadrant 07/29/2015  . Liver mass 07/11/2015  . Essential hypertension 09/28/2014  . Depression 03/23/2014  . ADHD (attention deficit hyperactivity disorder) 11/15/2012        Objective:    BP 126/82 mmHg  Pulse 72  Temp(Src) 98.9 F (37.2 C) (Oral)  Ht 5\' 2"  (1.575 m)  Wt 178 lb (80.74 kg)  BMI 32.55 kg/m2  SpO2 99%  Wt Readings from Last 3 Encounters:  08/19/15 178 lb (80.74 kg)  02/28/15 178 lb 12.8 oz (81.103 kg)  02/11/15 176 lb (79.833 kg)     Gen: NAD, alert, cooperative with exam, NCAT EYES: EOMI, no scleral injection or icterus ENT:  TMs pearly gray b/l, OP without erythema. White plaques  present on tongue, several small plaques on soft palate LYMPH: no cervical LAD CV: NRRR, normal S1/S2, no murmur, distal pulses 2+ b/l Resp: CTABL, no wheezes, normal WOB Abd: +BS, soft, tender R side of abd, dressing still in place, smaller incisions healing well. ND. no guarding or organomegaly. Mild CVA tenderness R side  Ext: No edema, warm Neuro: Alert and oriented MSK: normal muscle bulk     Assessment & Plan:    Theresa Reyes was seen today for thrush and pyuria, will treat as below. Recent hospitalization I have reviewed the OSH records.  Diagnoses and all orders for this visit:  UTI symptoms and Pyuria -     Urinalysis, Complete -     Urine culture -     nitrofurantoin, macrocrystal-monohydrate, (MACROBID) 100 MG capsule; Take 1 capsule (100 mg total) by mouth 2 (two) times daily. 1 po BId  Thrush -     magic mouthwash w/lidocaine SOLN; Equal parts viscous lidocaine, diphenhydramine, and Maalox. Take 5-36mL, swish around mouth then spit out QID prn -     fluconazole (DIFLUCAN) 100 MG tablet; Two first day (200mg ), then one tab daily. -     nystatin (MYCOSTATIN) 100000 UNIT/ML suspension; Take 5 mLs (500,000 Units total) by mouth 4 (four) times daily.  Essential hypertension Stable  Liver mass Removed during recent surgery, pathology still pending. Healing from surgery  Other orders -     Microscopic Examination  Follow up plan: Return if symptoms worsen or fail to improve.  Assunta Found, MD [facility] 08/19/2015, 3:26 PM

## 2015-08-21 LAB — URINE CULTURE

## 2015-08-26 DIAGNOSIS — D72829 Elevated white blood cell count, unspecified: Secondary | ICD-10-CM | POA: Insufficient documentation

## 2015-09-05 ENCOUNTER — Encounter: Payer: Self-pay | Admitting: Nurse Practitioner

## 2015-09-05 ENCOUNTER — Ambulatory Visit (INDEPENDENT_AMBULATORY_CARE_PROVIDER_SITE_OTHER): Payer: Commercial Managed Care - PPO | Admitting: Nurse Practitioner

## 2015-09-05 VITALS — BP 142/76 | HR 70 | Temp 98.5°F | Ht 62.0 in | Wt 179.4 lb

## 2015-09-05 DIAGNOSIS — N951 Menopausal and female climacteric states: Secondary | ICD-10-CM

## 2015-09-05 MED ORDER — CONJ ESTROG-MEDROXYPROGEST ACE 0.3-1.5 MG PO TABS: 1.0000 | ORAL_TABLET | Freq: Every day | ORAL | Status: DC

## 2015-09-05 NOTE — Progress Notes (Signed)
   Subjective:    Patient ID: Theresa Reyes, female    DOB: 01/25/1956, 60 y.o.   MRN: [id number]  HPI Patient was last seen on 08/19/15, her previous complaints has since resolved. Of note the result of the pathology from the surgery she had on her liver (gall bladder was removed during the surgery), came back benign.  Patient here today because she has been having cold night sweats since surgery. She described the night sweats as "debilitating", she has to change cloths any where from 2 to 3 times during the nigh, she also endorsed chills. She denies fever  Review of Systems  Constitutional: Negative.  Negative for appetite change, fatigue and unexpected weight change.  HENT: Negative.   Respiratory: Negative.  Negative for cough, chest tightness, shortness of breath and wheezing.   Cardiovascular: Negative.  Negative for chest pain and palpitations.  Gastrointestinal: Negative.  Negative for nausea, vomiting, abdominal pain (pain at surgical site), diarrhea and constipation.  Genitourinary: Negative.   Psychiatric/Behavioral: Negative.        Objective:   Physical Exam  Constitutional: She is oriented to person, place, and time. She appears well-developed and well-nourished.  Eyes: Pupils are equal, round, and reactive to light. Right eye discharge: conjuctiva pale.  Cardiovascular: Normal rate, regular rhythm, normal heart sounds and intact distal pulses.   No murmur heard. Pulmonary/Chest: Effort normal and breath sounds normal. No respiratory distress.  Abdominal: Soft. Bowel sounds are normal. There is tenderness (surgical site negative for swelling or redness).  Musculoskeletal: She exhibits no edema.  Neurological: She is alert and oriented to person, place, and time.  Skin: Skin is warm and dry.  Psychiatric: She has a normal mood and affect. Her behavior is normal.    BP 142/76 mmHg  Pulse 70  Temp(Src) 98.5 F (36.9 C) (Oral)  Ht 5\' 2"  (1.575 m)  Wt 179 lb 6.4 oz  (81.375 kg)  BMI 32.80 kg/m2    Assessment & Plan:  1. Menopausal sweats Keep up with PAP smears - estrogen, conjugated,-medroxyprogesterone (PREMPRO) 0.3-1.5 MG tablet; Take 1 tablet by mouth daily.  Dispense: 1 tablet; Refill: 1    Health maintenance reviewed Diet and exercise encouraged Continue all meds Follow up  In 3 months   [place], FNP

## 2015-09-05 NOTE — Patient Instructions (Signed)
Menopause Menopause is the normal time of life when menstrual periods stop completely. Menopause is complete when you have missed 12 consecutive menstrual periods. It usually occurs between the ages of 48 years and 55 years. Very rarely does a woman develop menopause before the age of 40 years. At menopause, your ovaries stop producing the female hormones estrogen and progesterone. This can cause undesirable symptoms and also affect your health. Sometimes the symptoms may occur 4-5 years before the menopause begins. There is no relationship between menopause and:  Oral contraceptives.  Number of children you had.  Race.  The age your menstrual periods started (menarche). Heavy smokers and very thin women may develop menopause earlier in life. CAUSES  The ovaries stop producing the female hormones estrogen and progesterone.  Other causes include:  Surgery to remove both ovaries.  The ovaries stop functioning for no known reason.  Tumors of the pituitary gland in the brain.  Medical disease that affects the ovaries and hormone production.  Radiation treatment to the abdomen or pelvis.  Chemotherapy that affects the ovaries. SYMPTOMS   Hot flashes.  Night sweats.  Decrease in sex drive.  Vaginal dryness and thinning of the vagina causing painful intercourse.  Dryness of the skin and developing wrinkles.  Headaches.  Tiredness.  Irritability.  Memory problems.  Weight gain.  Bladder infections.  Hair growth of the face and chest.  Infertility. More serious symptoms include:  Loss of bone (osteoporosis) causing breaks (fractures).  Depression.  Hardening and narrowing of the arteries (atherosclerosis) causing heart attacks and strokes. DIAGNOSIS   When the menstrual periods have stopped for 12 straight months.  Physical exam.  Hormone studies of the blood. TREATMENT  There are many treatment choices and nearly as many questions about them. The  decisions to treat or not to treat menopausal changes is an individual choice made with your health care provider. Your health care provider can discuss the treatments with you. Together, you can decide which treatment will work best for you. Your treatment choices may include:   Hormone therapy (estrogen and progesterone).  Non-hormonal medicines.  Treating the individual symptoms with medicine (for example antidepressants for depression).  Herbal medicines that may help specific symptoms.  Counseling by a psychiatrist or psychologist.  Group therapy.  Lifestyle changes including:  Eating healthy.  Regular exercise.  Limiting caffeine and alcohol.  Stress management and meditation.  No treatment. HOME CARE INSTRUCTIONS   Take the medicine your health care provider gives you as directed.  Get plenty of sleep and rest.  Exercise regularly.  Eat a diet that contains calcium (good for the bones) and soy products (acts like estrogen hormone).  Avoid alcoholic beverages.  Do not smoke.  If you have hot flashes, dress in layers.  Take supplements, calcium, and vitamin D to strengthen bones.  You can use over-the-counter lubricants or moisturizers for vaginal dryness.  Group therapy is sometimes very helpful.  Acupuncture may be helpful in some cases. SEEK MEDICAL CARE IF:   You are not sure you are in menopause.  You are having menopausal symptoms and need advice and treatment.  You are still having menstrual periods after age 55 years.  You have pain with intercourse.  Menopause is complete (no menstrual period for 12 months) and you develop vaginal bleeding.  You need a referral to a specialist (gynecologist, psychiatrist, or psychologist) for treatment. SEEK IMMEDIATE MEDICAL CARE IF:   You have severe depression.  You have excessive vaginal bleeding.    You fell and think you have a broken bone.  You have pain when you urinate.  You develop leg or  chest pain.  You have a fast pounding heart beat (palpitations).  You have severe headaches.  You develop vision problems.  You feel a lump in your breast.  You have abdominal pain or severe indigestion.   This information is not intended to replace advice given to you by your health care provider. Make sure you discuss any questions you have with your health care provider.   Document Released: 08/01/2003 Document Revised: 01/11/2013 Document Reviewed: 12/08/2012 Elsevier Interactive Patient Education 2016 [employer]  

## 2015-10-16 ENCOUNTER — Other Ambulatory Visit: Payer: Self-pay | Admitting: Family

## 2015-12-23 ENCOUNTER — Telehealth: Payer: Self-pay | Admitting: Nurse Practitioner

## 2016-01-26 ENCOUNTER — Other Ambulatory Visit: Payer: Self-pay | Admitting: Nurse Practitioner

## 2016-03-30 ENCOUNTER — Ambulatory Visit: Payer: Commercial Managed Care - PPO | Admitting: Family Medicine

## 2016-03-31 ENCOUNTER — Encounter: Payer: Self-pay | Admitting: Nurse Practitioner

## 2016-03-31 ENCOUNTER — Telehealth: Payer: Self-pay | Admitting: Nurse Practitioner

## 2016-03-31 NOTE — Telephone Encounter (Signed)
Left message for patient to call office back to re schedule missed appointment.

## 2016-05-02 ENCOUNTER — Other Ambulatory Visit: Payer: Self-pay | Admitting: Nurse Practitioner

## 2016-05-04 NOTE — Telephone Encounter (Signed)
Left detailed message stating rx was refilled but this was the last one without being seen and to call the office to schedule appt.

## 2016-05-04 NOTE — Telephone Encounter (Signed)
Last refill without being seen 

## 2016-07-29 ENCOUNTER — Other Ambulatory Visit: Payer: Self-pay | Admitting: Nurse Practitioner

## 2016-07-30 NOTE — Telephone Encounter (Signed)
Last refill without being seen 

## 2016-07-30 NOTE — Telephone Encounter (Signed)
Detailed message left for patient that she would need to be seen for any further refills

## 2016-08-10 ENCOUNTER — Encounter (HOSPITAL_COMMUNITY): Payer: Self-pay | Admitting: Emergency Medicine

## 2016-08-10 ENCOUNTER — Emergency Department (HOSPITAL_COMMUNITY): Payer: PRIVATE HEALTH INSURANCE

## 2016-08-10 ENCOUNTER — Encounter: Payer: Self-pay | Admitting: Family Medicine

## 2016-08-10 ENCOUNTER — Emergency Department (HOSPITAL_COMMUNITY)
Admission: EM | Admit: 2016-08-10 | Discharge: 2016-08-10 | Disposition: A | Discharge status: Home / Self Care | Payer: PRIVATE HEALTH INSURANCE | Attending: Emergency Medicine | Admitting: Emergency Medicine

## 2016-08-10 ENCOUNTER — Ambulatory Visit (INDEPENDENT_AMBULATORY_CARE_PROVIDER_SITE_OTHER): Payer: Commercial Managed Care - PPO | Admitting: Family Medicine

## 2016-08-10 VITALS — BP 191/88 | HR 69 | Temp 100.6°F | Ht 62.0 in | Wt 179.0 lb

## 2016-08-10 DIAGNOSIS — R1114 Bilious vomiting: Secondary | ICD-10-CM

## 2016-08-10 DIAGNOSIS — F909 Attention-deficit hyperactivity disorder, unspecified type: Secondary | ICD-10-CM | POA: Insufficient documentation

## 2016-08-10 DIAGNOSIS — R112 Nausea with vomiting, unspecified: Secondary | ICD-10-CM | POA: Diagnosis not present

## 2016-08-10 DIAGNOSIS — R1011 Right upper quadrant pain: Secondary | ICD-10-CM

## 2016-08-10 DIAGNOSIS — F17210 Nicotine dependence, cigarettes, uncomplicated: Secondary | ICD-10-CM | POA: Diagnosis not present

## 2016-08-10 DIAGNOSIS — Z79899 Other long term (current) drug therapy: Secondary | ICD-10-CM | POA: Insufficient documentation

## 2016-08-10 LAB — CBC
HCT: 44.5 % (ref 36.0–46.0)
HEMOGLOBIN: 15.4 g/dL — AB (ref 12.0–15.0)
MCH: 29.5 pg (ref 26.0–34.0)
MCHC: 34.6 g/dL (ref 30.0–36.0)
MCV: 85.2 fL (ref 78.0–100.0)
Platelets: 278 10*3/uL (ref 150–400)
RBC: 5.22 MIL/uL — ABNORMAL HIGH (ref 3.87–5.11)
RDW: 13.5 % (ref 11.5–15.5)
WBC: 18.7 10*3/uL — AB (ref 4.0–10.5)

## 2016-08-10 LAB — COMPREHENSIVE METABOLIC PANEL
ALK PHOS: 63 U/L (ref 38–126)
ALT: 16 U/L (ref 14–54)
AST: 21 U/L (ref 15–41)
Albumin: 4.6 g/dL (ref 3.5–5.0)
Anion gap: 9 (ref 5–15)
BILIRUBIN TOTAL: 0.5 mg/dL (ref 0.3–1.2)
BUN: 14 mg/dL (ref 6–20)
CALCIUM: 9.6 mg/dL (ref 8.9–10.3)
CO2: 24 mmol/L (ref 22–32)
CREATININE: 0.69 mg/dL (ref 0.44–1.00)
Chloride: 98 mmol/L — ABNORMAL LOW (ref 101–111)
GFR calc non Af Amer: >60 mL/min (ref >60)
Glucose, Bld: 128 mg/dL — ABNORMAL HIGH (ref 65–99)
Potassium: 3.3 mmol/L — ABNORMAL LOW (ref 3.5–5.1)
SODIUM: 131 mmol/L — AB (ref 135–145)
TOTAL PROTEIN: 8.2 g/dL — AB (ref 6.5–8.1)

## 2016-08-10 LAB — URINALYSIS, ROUTINE W REFLEX MICROSCOPIC
BILIRUBIN URINE: NEGATIVE
Bacteria, UA: NONE SEEN
GLUCOSE, UA: NEGATIVE mg/dL
Ketones, ur: 20 mg/dL — AB
LEUKOCYTES UA: NEGATIVE
NITRITE: NEGATIVE
Protein, ur: 100 mg/dL — AB
SPECIFIC GRAVITY, URINE: 1.014 (ref 1.005–1.030)
pH: 6 (ref 5.0–8.0)

## 2016-08-10 LAB — LIPASE, BLOOD: Lipase: 15 U/L (ref 11–51)

## 2016-08-10 MED ORDER — IOPAMIDOL (ISOVUE-300) INJECTION 61%
100.0000 mL | Freq: Once | INTRAVENOUS | Status: AC | PRN
  Administered 2016-08-10: 100 mL via INTRAVENOUS

## 2016-08-10 MED ORDER — SODIUM CHLORIDE 0.9 % IV BOLUS (SEPSIS)
1000.0000 mL | Freq: Once | INTRAVENOUS | Status: AC
  Administered 2016-08-10: 1000 mL via INTRAVENOUS

## 2016-08-10 MED ORDER — SODIUM CHLORIDE 0.9 % IV SOLN
INTRAVENOUS | Status: DC
  Administered 2016-08-10: 15:00:00 via INTRAVENOUS

## 2016-08-10 MED ORDER — PROMETHAZINE HCL 25 MG PO TABS: 25.0000 mg | ORAL_TABLET | Freq: Four times a day (QID) | ORAL | 0 refills | Status: DC | PRN

## 2016-08-10 MED ORDER — DICYCLOMINE HCL 20 MG PO TABS: 20.0000 mg | ORAL_TABLET | Freq: Two times a day (BID) | ORAL | 0 refills | Status: DC

## 2016-08-10 MED ORDER — PROMETHAZINE HCL 25 MG/ML IJ SOLN
25.0000 mg | Freq: Once | INTRAMUSCULAR | Status: AC
  Administered 2016-08-10: 25 mg via INTRAMUSCULAR

## 2016-08-10 MED ORDER — ONDANSETRON 4 MG PO TBDP
4.0000 mg | ORAL_TABLET | Freq: Once | ORAL | Status: AC
  Administered 2016-08-10: 4 mg via ORAL
  Filled 2016-08-10: qty 1

## 2016-08-10 MED ORDER — MORPHINE SULFATE (PF) 4 MG/ML IV SOLN
4.0000 mg | Freq: Once | INTRAVENOUS | Status: AC
Start: 2016-08-10 — End: 2016-08-10
  Administered 2016-08-10: 4 mg via INTRAVENOUS
  Filled 2016-08-10: qty 1

## 2016-08-10 MED ORDER — PROMETHAZINE HCL 25 MG RE SUPP: 25.0000 mg | Freq: Four times a day (QID) | RECTAL | 0 refills | Status: DC | PRN

## 2016-08-10 MED ORDER — IOPAMIDOL (ISOVUE-300) INJECTION 61%
INTRAVENOUS | Status: AC
  Filled 2016-08-10: qty 30

## 2016-08-10 MED ORDER — DICYCLOMINE HCL 10 MG PO CAPS
10.0000 mg | ORAL_CAPSULE | Freq: Once | ORAL | Status: AC
  Administered 2016-08-10: 10 mg via ORAL
  Filled 2016-08-10: qty 1

## 2016-08-10 NOTE — ED Notes (Signed)
Pt given ginger ale to drink and saltine crackers to eat per fluid challenge and PO Challenge.

## 2016-08-10 NOTE — ED Provider Notes (Signed)
Emergency Department Provider Note   I have reviewed the triage vital signs and the nursing notes.   HISTORY  Chief Complaint Abdominal Pain   HPI Theresa Reyes is a 61 y.o. female with PMH of ADD, depression 1 year s/p cholecystectomy and partial hepatectomy resents to the emergency department for evaluation of right upper quadrant abdominal pain and intractable nausea and vomiting over the past 24 hours. Patient states that pain and vomiting began approximately same time. She describes bilious emesis with transition to dry heaving today. No associated diarrhea. She is passing gas. She was a primary care physician today and was found to have a fever. They started an IV and gave Phenergan but she continued to have dry heaving through that was referred to the emergency department. She is also describing some mid epigastric to lower central chest discomfort made worse with vomiting. No radiation.   Past Medical History:  Diagnosis Date  . ADD (attention deficit disorder)    adult  . Depression     Patient Active Problem List   Diagnosis Date Noted  . Menopausal sweats 09/05/2015  . Anxiety 08/08/2015  . Current smoker 07/29/2015  . Abdominal pain, right upper quadrant 07/29/2015  . Liver mass 07/11/2015  . Essential hypertension 09/28/2014  . Depression 03/23/2014  . ADHD (attention deficit hyperactivity disorder) 11/15/2012    Past Surgical History:  Procedure Laterality Date  . ECTOPIC PREGNANCY SURGERY      Current Outpatient Rx  . Order #: [id number] Class: Normal  . Order #: [id number] Class: Historical Med  . Order #: [id number] Class: Print  . Order #: [id number] Class: Print  . Order #: [id number] Class: Print    Allergies Patient has no known allergies.  Family History  Problem Relation Age of Onset  . Hypertension Mother   . Cancer Father     colon    Social History Social History  Substance Use Topics  . Smoking status: Current Every Day Smoker   Packs/day: 1.00    Types: Cigarettes  . Smokeless tobacco: Never Used  . Alcohol use No    Review of Systems  10-point ROS otherwise negative.  ____________________________________________   PHYSICAL EXAM:  VITAL SIGNS: ED Triage Vitals [08/10/16 1542]  Enc Vitals Group     BP (!) 207/95     Pulse Rate 64     Resp 18     Temp 99.8 F (37.7 C)     Temp Source Oral     SpO2 95 %     Weight 179 lb (81.2 kg)     Height 5\' 5"  (1.651 m)     Pain Score 10   Constitutional: Alert and oriented. Appears uncomfortable.  Eyes: Conjunctivae are normal. Head: Atraumatic. Nose: No congestion/rhinnorhea. Mouth/Throat: Mucous membranes are dry. Neck: No stridor.   Cardiovascular: Normal rate, regular rhythm. Good peripheral circulation. Grossly normal heart sounds.   Respiratory: Normal respiratory effort.  No retractions. Lungs CTAB. Gastrointestinal: Soft with focal RUQ tenderness to palpation. No rebound or guarding. No distention.  Musculoskeletal: No lower extremity tenderness nor edema. No gross deformities of extremities. Neurologic:  Normal speech and language. No gross focal neurologic deficits are appreciated.  Skin:  Skin is warm, dry and intact. No rash noted. Psychiatric: Mood and affect are normal. Speech and behavior are normal.  ____________________________________________   LABS (all labs ordered are listed, but only abnormal results are displayed)  Labs Reviewed  COMPREHENSIVE METABOLIC PANEL - Abnormal; Notable for the following:  Result Value   Sodium 131 (*)    Potassium 3.3 (*)    Chloride 98 (*)    Glucose, Bld 128 (*)    Total Protein 8.2 (*)    All other components within normal limits  CBC - Abnormal; Notable for the following:    WBC 18.7 (*)    RBC 5.22 (*)    Hemoglobin 15.4 (*)    All other components within normal limits  URINALYSIS, ROUTINE W REFLEX MICROSCOPIC - Abnormal; Notable for the following:    Hgb urine dipstick MODERATE (*)     Ketones, ur 20 (*)    Protein, ur 100 (*)    All other components within normal limits  LIPASE, BLOOD  I-STAT TROPOININ, ED   ____________________________________________  EKG   EKG Interpretation  Date/Time:  Monday August 10 2016 17:17:44 EDT Ventricular Rate:  53 PR Interval:    QRS Duration: 138 QT Interval:  449 QTC Calculation: 422 R Axis:   78 Text Interpretation:  Sinus rhythm Right bundle branch block No STEMI.  Confirmed by LONG MD, JOSHUA [phone]) on 08/10/2016 5:36:52 PM       ____________________________________________  RADIOLOGY  Dg Chest 2 View  Result Date: 08/10/2016 CLINICAL DATA:  RIGHT upper quadrant pain and vomiting today, fever EXAM: CHEST  2 VIEW COMPARISON:  07/04/2013 FINDINGS: Normal heart size, mediastinal contours, and pulmonary vascularity. Lungs clear. No pleural effusion or pneumothorax. Bones unremarkable. IMPRESSION: No acute abnormalities. Electronically Signed   By: Lavonia Dana M.D.   On: 08/10/2016 17:47   Ct Abdomen Pelvis W Contrast  Result Date: 08/10/2016 CLINICAL DATA:  Right upper quadrant abdominal pain and vomiting for 2 days. Reported history of partial liver resection and cholecystectomy. EXAM: CT ABDOMEN AND PELVIS WITH CONTRAST TECHNIQUE: Multidetector CT imaging of the abdomen and pelvis was performed using the standard protocol following bolus administration of intravenous contrast. CONTRAST:  132mL ISOVUE-300 IOPAMIDOL (ISOVUE-300) INJECTION 61% COMPARISON:  07/04/2013 right upper quadrant abdominal sonogram. FINDINGS: Lower chest: No significant pulmonary nodules or acute consolidative airspace disease. Hepatobiliary: Surgical clips are noted adjacent to the inferior right liver lobe with contour irregularity in this location suggestive of subtotal inferior right hepatectomy. Two tiny hypodense 0.3 cm right lower lobe lesions are too small to characterize. No additional liver lesions. Cholecystectomy. Mild diffuse central  intrahepatic biliary ductal dilatation and common bile duct diameter 8 mm are within expected post cholecystectomy limits. No radiopaque choledocholithiasis. Pancreas: Normal, with no mass or duct dilation. Spleen: Normal size. No mass. Adrenals/Urinary Tract: Irregular thickening of the left greater than right adrenal glands without discrete adrenal nodules. Ill-defined perinephric fluid symmetrically involving both kidneys. No renal masses. No hydronephrosis. Normal bladder. Stomach/Bowel: Small hiatal hernia. Otherwise collapsed and grossly normal stomach. Normal caliber small bowel with no small bowel wall thickening. Normal appendix . Mild sigmoid diverticulosis. No large bowel wall thickening or pericolonic fat stranding. Oral contrast progresses to the distal small bowel. Vascular/Lymphatic: Atherosclerotic nonaneurysmal abdominal aorta. Patent portal, splenic, hepatic and renal veins. No pathologically enlarged lymph nodes in the abdomen or pelvis. Reproductive: Grossly normal uterus.  No adnexal mass. Other: No pneumoperitoneum, ascites or focal fluid collection. Musculoskeletal: No aggressive appearing focal osseous lesions. Mild thoracolumbar spondylosis. IMPRESSION: 1. Nonspecific symmetric bilateral perinephric fluid. While this may be a chronic finding, acute pyelonephritis cannot be excluded and clinical correlation is necessary. No hydronephrosis. No renal masses. Normal bladder. 2. Postsurgical changes in the inferior right liver lobe. No liver masses. 3. Cholecystectomy. Bile  ducts are within expected post cholecystectomy limits . 4. Mild sigmoid diverticulosis, with no evidence of acute diverticulitis. No evidence of bowel obstruction or acute bowel inflammation. Normal appendix. 5. Aortic atherosclerosis. 6. Small hiatal hernia. Electronically Signed   By: Ilona Sorrel M.D.   On: 08/10/2016 19:25    ____________________________________________   PROCEDURES  Procedure(s) performed:    Procedures  None ____________________________________________   INITIAL IMPRESSION / ASSESSMENT AND PLAN / ED COURSE  Pertinent labs & imaging results that were available during my care of the patient were reviewed by me and considered in my medical decision making (see chart for details).  Patient resents emergency department for evaluation of right upper quadrant tenderness and pain with some associated chest discomfort. She's had intractable nausea and vomiting for the past 24 hours. She is one year status post partial hepatectomy and cholecystectomy. Abdomen is focally tender in the right upper quadrant. Will obtain CT scan with IV contrast for better characterization. Patient has fever here in the emergency department and appears clinically dehydrated. Will give IVF and pain medication.   CT scan is largely unremarkable. No other clinical or lab evidence ot suggest pyelonephritis. Plan for PO challenge and discharge. Patient is feeling better after IVF and nausea/pain control.   On re-examination the patient is tolerating PO and feels comfortable with discharge. Will discharge with phenergan and bentyl. Suspect gastroenteritis but discussed strict return precautions in detail with patient and husband at bedside.   At this time, I do not feel there is any life-threatening condition present. I have reviewed and discussed all results (EKG, imaging, lab, urine as appropriate), exam findings with patient. I have reviewed nursing notes and appropriate previous records.  I feel the patient is safe to be discharged home without further emergent workup. Discussed usual and customary return precautions. Patient and family (if present) verbalize understanding and are comfortable with this plan.  Patient will follow-up with their primary care provider. If they do not have a primary care provider, information for follow-up has been provided to them. All questions have been  answered.  ____________________________________________  FINAL CLINICAL IMPRESSION(S) / ED DIAGNOSES  Final diagnoses:  Right upper quadrant abdominal pain  Non-intractable vomiting with nausea, unspecified vomiting type     MEDICATIONS GIVEN DURING THIS VISIT:  Medications  sodium chloride 0.9 % bolus 1,000 mL (0 mLs Intravenous Stopped 08/10/16 2206)  morphine 4 MG/ML injection 4 mg (4 mg Intravenous Given 08/10/16 1726)  iopamidol (ISOVUE-300) 61 % injection 100 mL (100 mLs Intravenous Contrast Given 08/10/16 1853)  dicyclomine (BENTYL) capsule 10 mg (10 mg Oral Given 08/10/16 2125)  ondansetron (ZOFRAN-ODT) disintegrating tablet 4 mg (4 mg Oral Given 08/10/16 2125)     NEW OUTPATIENT MEDICATIONS STARTED DURING THIS VISIT:  Discharge Medication List as of 08/10/2016  9:53 PM    START taking these medications   Details  dicyclomine (BENTYL) 20 MG tablet Take 1 tablet (20 mg total) by mouth 2 (two) times daily., Starting Mon 08/10/2016, Print    promethazine (PHENERGAN) 25 MG suppository Place 1 suppository (25 mg total) rectally every 6 (six) hours as needed for nausea or vomiting., Starting Mon 08/10/2016, Print    promethazine (PHENERGAN) 25 MG tablet Take 1 tablet (25 mg total) by mouth every 6 (six) hours as needed for nausea or vomiting., Starting Mon 08/10/2016, Print          Note:  This document was prepared using Dragon voice recognition software and may include unintentional dictation errors.  Nanda Quinton, MD Emergency Medicine   Margette Fast, MD 08/11/16 [phone]

## 2016-08-10 NOTE — ED Triage Notes (Signed)
Pt reports sent from PCP office for evaluation of right sided abd pain, vomiting, headache x2 days. Pt denies fever and reports x1 year ago had part of liver taken out and gallbladder removed. nad noted.

## 2016-08-10 NOTE — Progress Notes (Signed)
BP (!) 183/104   Pulse 69   Temp (!) 100.6 F (38.1 C) (Oral)   Ht 5\' 2"  (1.575 m)   Wt 179 lb (81.2 kg)   BMI 32.74 kg/m    Subjective:    Patient ID: Theresa Reyes, female    DOB: 1956/05/20, 61 y.o.   MRN: [id number]  HPI: Theresa Reyes is a 61 y.o. female presenting on 08/10/2016 for Abdominal Pain (RUQ pain, had cholocystectomy last year); Vomiting (began 2 days ago, has been unable to eat or drink anything; had Zofran and tried it but unable to keep it down both oral and SL); and Headache   HPI Right upper quadrant abdominal pain and nausea and vomiting Patient presents today with right upper quadrant abdominal pain and nausea and vomiting that is been going on for the past 2 days. She says the vomiting has become bilious green in nature and now she is just having a lot of dry heaving. She said the last time that she urinated was about 7 hours ago but has not been able to keep anything down and make urine since then. She denies any constipation or diarrhea. Her last bowel movement was 2 days ago. She says a lot of this vomiting is very similar to when she had liver cancer and a cholecystectomy last year and wants to be put in consult with Dr. Carlis Abbott rather than being admitted through the emergency department. She denies any fevers or chills. She does have 8 out of 10 right upper quadrant and epigastric abdominal pain. She denies any dysuria or hematuria. She has been using sublingual and oral Zofran and is not keeping anything down with it.  Relevant past medical, surgical, family and social history reviewed and updated as indicated. Interim medical history since our last visit reviewed. Allergies and medications reviewed and updated.  Review of Systems  Constitutional: Negative for chills and fever.  HENT: Negative for congestion, ear discharge and ear pain.   Eyes: Negative for redness and visual disturbance.  Respiratory: Negative for chest tightness and shortness of breath.     Cardiovascular: Negative for chest pain and leg swelling.  Gastrointestinal: Positive for abdominal pain, nausea and vomiting. Negative for blood in stool, constipation and diarrhea.  Genitourinary: Negative for difficulty urinating and dysuria.  Musculoskeletal: Negative for back pain and gait problem.  Skin: Negative for rash.  Neurological: Negative for light-headedness and headaches.  Psychiatric/Behavioral: Negative for agitation and behavioral problems.  All other systems reviewed and are negative.   Per HPI unless specifically indicated above   Allergies as of 08/10/2016   No Known Allergies     Medication List       Accurate as of 08/10/16  2:03 PM. Always use your most recent med list.          escitalopram 20 MG tablet Commonly known as:  LEXAPRO Take 1 tablet (20 mg total) by mouth daily.   estrogen (conjugated)-medroxyprogesterone 0.3-1.5 MG tablet Commonly known as:  PREMPRO Take 1 tablet by mouth daily.   gabapentin 300 MG capsule Commonly known as:  NEURONTIN Take 300 mg by mouth.   LORazepam 0.5 MG tablet Commonly known as:  ATIVAN TAKE ONE TABLET BY MOUTH EVERY 8 HOURS AS NEEDED FOR ANXIETY   oxyCODONE 5 MG immediate release tablet Commonly known as:  Oxy IR/ROXICODONE   polyethylene glycol powder powder Commonly known as:  GLYCOLAX/MIRALAX          Objective:    BP (!) 183/104  Pulse 69   Temp (!) 100.6 F (38.1 C) (Oral)   Ht 5\' 2"  (1.575 m)   Wt 179 lb (81.2 kg)   BMI 32.74 kg/m   Wt Readings from Last 3 Encounters:  08/10/16 179 lb (81.2 kg)  09/05/15 179 lb 6.4 oz (81.4 kg)  08/19/15 178 lb (80.7 kg)    Physical Exam  Constitutional: She is oriented to person, place, and time. She appears well-developed and well-nourished. No distress.  Eyes: Conjunctivae are normal.  Neck: Neck supple.  Cardiovascular: Normal rate, regular rhythm, normal heart sounds and intact distal pulses.   No murmur heard. Pulmonary/Chest: Effort  normal and breath sounds normal. No respiratory distress. She has no wheezes. She has no rales.  Abdominal: Soft. Bowel sounds are normal. She exhibits no distension. There is no hepatosplenomegaly. There is tenderness in the right upper quadrant and epigastric area. There is no rebound, no guarding and no CVA tenderness.  Musculoskeletal: Normal range of motion. She exhibits no edema or tenderness.  Lymphadenopathy:    She has no cervical adenopathy.  Neurological: She is alert and oriented to person, place, and time. Coordination normal.  Skin: Skin is warm and dry. No rash noted. She is not diaphoretic.  Psychiatric: She has a normal mood and affect. Her behavior is normal.  Nursing note and vitals reviewed.      Assessment & Plan:   Problem List Items Addressed This Visit    None    Visit Diagnoses    Right upper quadrant abdominal pain    -  Primary   Relevant Medications   promethazine (PHENERGAN) injection 25 mg (Start on 08/10/2016  2:15 PM)   Bilious vomiting with nausea       Relevant Medications   promethazine (PHENERGAN) injection 25 mg (Start on 08/10/2016  2:15 PM)      Recommended for patient to go to the emergency department because of bilious vomiting, patient wants to go be admitted to [facility] where her oncologist is in we have a call out to his office and waiting call back. He did call back and said that he would not admit her directly but would rather her go through the emergency department. Patient is very resistant and hesitant. She would like to try the IV fluids here and see how she does but will go to the emergency department if it does not improve. She would also like to try the IM Phenergan   Patient finished 500 mL bolus of IV normal saline and had IM Phenergan but is still having dry heaving and nausea and vomiting 20 minutes later. Recommended to go to the emergency department  Follow up plan: Return if symptoms worsen or fail to improve.  Counseling  provided for all of the vaccine components No orders of the defined types were placed in this encounter.   Caryl Pina, MD [facility] 08/10/2016, 2:03 PM

## 2016-08-10 NOTE — Discharge Instructions (Signed)

## 2016-08-12 ENCOUNTER — Other Ambulatory Visit: Payer: Self-pay

## 2016-08-12 ENCOUNTER — Telehealth: Payer: Self-pay | Admitting: Family Medicine

## 2016-08-12 DIAGNOSIS — R109 Unspecified abdominal pain: Secondary | ICD-10-CM

## 2016-08-12 NOTE — Telephone Encounter (Signed)
What type of referral do you need? GI  Have you been seen at our office for this problem? YES 08-10-16 (If no, schedule them an appointment.  They will need to be seen before a referral can be done.)  Is there a particular doctor or location that you prefer? NO  Patient notified that referrals can take up to a week or longer to process. If they haven't heard anything within a week they should call back and speak with the referral department.

## 2016-08-12 NOTE — Telephone Encounter (Signed)
done

## 2016-08-13 ENCOUNTER — Encounter (HOSPITAL_COMMUNITY): Payer: Self-pay | Admitting: Emergency Medicine

## 2016-08-13 ENCOUNTER — Observation Stay (HOSPITAL_COMMUNITY)
Admission: EM | Admit: 2016-08-13 | Discharge: 2016-08-14 | Disposition: A | Discharge status: Home / Self Care | Payer: PRIVATE HEALTH INSURANCE | Attending: Internal Medicine | Admitting: Internal Medicine

## 2016-08-13 ENCOUNTER — Emergency Department (HOSPITAL_COMMUNITY): Payer: PRIVATE HEALTH INSURANCE

## 2016-08-13 ENCOUNTER — Other Ambulatory Visit: Payer: Self-pay

## 2016-08-13 DIAGNOSIS — E860 Dehydration: Secondary | ICD-10-CM | POA: Insufficient documentation

## 2016-08-13 DIAGNOSIS — N179 Acute kidney failure, unspecified: Secondary | ICD-10-CM | POA: Diagnosis not present

## 2016-08-13 DIAGNOSIS — F909 Attention-deficit hyperactivity disorder, unspecified type: Secondary | ICD-10-CM | POA: Insufficient documentation

## 2016-08-13 DIAGNOSIS — F17210 Nicotine dependence, cigarettes, uncomplicated: Secondary | ICD-10-CM | POA: Insufficient documentation

## 2016-08-13 DIAGNOSIS — E876 Hypokalemia: Secondary | ICD-10-CM | POA: Insufficient documentation

## 2016-08-13 DIAGNOSIS — R778 Other specified abnormalities of plasma proteins: Secondary | ICD-10-CM

## 2016-08-13 DIAGNOSIS — I10 Essential (primary) hypertension: Secondary | ICD-10-CM | POA: Insufficient documentation

## 2016-08-13 DIAGNOSIS — R7989 Other specified abnormal findings of blood chemistry: Secondary | ICD-10-CM

## 2016-08-13 DIAGNOSIS — Z79899 Other long term (current) drug therapy: Secondary | ICD-10-CM | POA: Insufficient documentation

## 2016-08-13 DIAGNOSIS — R112 Nausea with vomiting, unspecified: Secondary | ICD-10-CM

## 2016-08-13 LAB — BASIC METABOLIC PANEL
ANION GAP: 13 (ref 5–15)
BUN: 29 mg/dL — ABNORMAL HIGH (ref 6–20)
CALCIUM: 9.6 mg/dL (ref 8.9–10.3)
CO2: 24 mmol/L (ref 22–32)
CREATININE: 1.73 mg/dL — AB (ref 0.44–1.00)
Chloride: 94 mmol/L — ABNORMAL LOW (ref 101–111)
GFR, EST AFRICAN AMERICAN: 36 mL/min — AB (ref >60)
GFR, EST NON AFRICAN AMERICAN: 31 mL/min — AB (ref >60)
GLUCOSE: 123 mg/dL — AB (ref 65–99)
Potassium: 2.6 mmol/L — CL (ref 3.5–5.1)
Sodium: 131 mmol/L — ABNORMAL LOW (ref 135–145)

## 2016-08-13 LAB — TROPONIN I: TROPONIN I: 0.06 ng/mL — AB (ref <0.03)

## 2016-08-13 LAB — CBC
HCT: 47.8 % — ABNORMAL HIGH (ref 36.0–46.0)
HEMOGLOBIN: 17 g/dL — AB (ref 12.0–15.0)
MCH: 30 pg (ref 26.0–34.0)
MCHC: 35.6 g/dL (ref 30.0–36.0)
MCV: 84.3 fL (ref 78.0–100.0)
PLATELETS: 287 10*3/uL (ref 150–400)
RBC: 5.67 MIL/uL — ABNORMAL HIGH (ref 3.87–5.11)
RDW: 13.2 % (ref 11.5–15.5)
WBC: 17.6 10*3/uL — ABNORMAL HIGH (ref 4.0–10.5)

## 2016-08-13 LAB — MAGNESIUM: MAGNESIUM: 2.4 mg/dL (ref 1.7–2.4)

## 2016-08-13 MED ORDER — MAGNESIUM SULFATE 2 GM/50ML IV SOLN
2.0000 g | Freq: Once | INTRAVENOUS | Status: AC
  Administered 2016-08-13: 2 g via INTRAVENOUS
  Filled 2016-08-13: qty 50

## 2016-08-13 MED ORDER — ONDANSETRON HCL 4 MG PO TABS: 4.0000 mg | ORAL_TABLET | Freq: Four times a day (QID) | ORAL | Status: DC | PRN

## 2016-08-13 MED ORDER — ACETAMINOPHEN 325 MG PO TABS
650.0000 mg | ORAL_TABLET | Freq: Four times a day (QID) | ORAL | Status: DC | PRN
  Administered 2016-08-13: 650 mg via ORAL
  Filled 2016-08-13: qty 2

## 2016-08-13 MED ORDER — ACETAMINOPHEN 650 MG RE SUPP: 650.0000 mg | Freq: Four times a day (QID) | RECTAL | Status: DC | PRN

## 2016-08-13 MED ORDER — SENNOSIDES-DOCUSATE SODIUM 8.6-50 MG PO TABS: 1.0000 | ORAL_TABLET | Freq: Every evening | ORAL | Status: DC | PRN

## 2016-08-13 MED ORDER — ASPIRIN 81 MG PO CHEW
324.0000 mg | CHEWABLE_TABLET | Freq: Once | ORAL | Status: AC
Start: 2016-08-13 — End: 2016-08-13
  Administered 2016-08-13: 324 mg via ORAL
  Filled 2016-08-13: qty 4

## 2016-08-13 MED ORDER — ESCITALOPRAM OXALATE 10 MG PO TABS
20.0000 mg | ORAL_TABLET | Freq: Every day | ORAL | Status: DC
  Administered 2016-08-13 – 2016-08-14 (×2): 20 mg via ORAL
  Filled 2016-08-13 (×2): qty 2

## 2016-08-13 MED ORDER — PANTOPRAZOLE SODIUM 40 MG PO TBEC
40.0000 mg | DELAYED_RELEASE_TABLET | Freq: Every day | ORAL | Status: DC
  Administered 2016-08-13 – 2016-08-14 (×2): 40 mg via ORAL
  Filled 2016-08-13 (×2): qty 1

## 2016-08-13 MED ORDER — DICYCLOMINE HCL 10 MG PO CAPS
20.0000 mg | ORAL_CAPSULE | Freq: Three times a day (TID) | ORAL | Status: DC | PRN
  Administered 2016-08-14: 20 mg via ORAL
  Filled 2016-08-13: qty 2

## 2016-08-13 MED ORDER — ONDANSETRON HCL 4 MG/2ML IJ SOLN
4.0000 mg | Freq: Four times a day (QID) | INTRAMUSCULAR | Status: DC | PRN
  Administered 2016-08-13 – 2016-08-14 (×2): 4 mg via INTRAVENOUS
  Filled 2016-08-13 (×2): qty 2

## 2016-08-13 MED ORDER — SODIUM CHLORIDE 0.9 % IV BOLUS (SEPSIS)
1000.0000 mL | Freq: Once | INTRAVENOUS | Status: AC
  Administered 2016-08-13: 1000 mL via INTRAVENOUS

## 2016-08-13 MED ORDER — PROMETHAZINE HCL 25 MG/ML IJ SOLN
25.0000 mg | Freq: Once | INTRAMUSCULAR | Status: AC
  Administered 2016-08-13: 25 mg via INTRAVENOUS
  Filled 2016-08-13: qty 1

## 2016-08-13 MED ORDER — GI COCKTAIL ~~LOC~~
30.0000 mL | Freq: Once | ORAL | Status: AC
  Administered 2016-08-13: 30 mL via ORAL
  Filled 2016-08-13: qty 30

## 2016-08-13 MED ORDER — FENTANYL CITRATE (PF) 100 MCG/2ML IJ SOLN
50.0000 ug | Freq: Once | INTRAMUSCULAR | Status: AC
  Administered 2016-08-13: 50 ug via INTRAVENOUS
  Filled 2016-08-13: qty 2

## 2016-08-13 MED ORDER — FAMOTIDINE IN NACL 20-0.9 MG/50ML-% IV SOLN
20.0000 mg | Freq: Once | INTRAVENOUS | Status: AC
  Administered 2016-08-13: 20 mg via INTRAVENOUS
  Filled 2016-08-13: qty 50

## 2016-08-13 MED ORDER — POTASSIUM CHLORIDE 20 MEQ/15ML (10%) PO SOLN
40.0000 meq | Freq: Once | ORAL | Status: AC
  Administered 2016-08-13: 40 meq via ORAL
  Filled 2016-08-13: qty 30

## 2016-08-13 MED ORDER — POTASSIUM CHLORIDE 10 MEQ/100ML IV SOLN
10.0000 meq | INTRAVENOUS | Status: AC
  Administered 2016-08-13 (×3): 10 meq via INTRAVENOUS
  Filled 2016-08-13 (×3): qty 100

## 2016-08-13 MED ORDER — POTASSIUM CHLORIDE CRYS ER 20 MEQ PO TBCR
40.0000 meq | EXTENDED_RELEASE_TABLET | ORAL | Status: AC
  Administered 2016-08-13 – 2016-08-14 (×3): 40 meq via ORAL
  Filled 2016-08-13 (×4): qty 2

## 2016-08-13 MED ORDER — SODIUM CHLORIDE 0.9 % IV SOLN
INTRAVENOUS | Status: DC
  Administered 2016-08-13 – 2016-08-14 (×3): via INTRAVENOUS

## 2016-08-13 MED ORDER — ENOXAPARIN SODIUM 40 MG/0.4ML ~~LOC~~ SOLN: 40.0000 mg | SUBCUTANEOUS | Status: DC

## 2016-08-13 MED ORDER — DICYCLOMINE HCL 10 MG PO CAPS
20.0000 mg | ORAL_CAPSULE | Freq: Once | ORAL | Status: AC
  Administered 2016-08-13: 20 mg via ORAL
  Filled 2016-08-13: qty 2

## 2016-08-13 NOTE — H&P (Signed)
History and Physical    Theresa Reyes VOZ:[id number] DOB: 01/01/1956 DOA: 08/13/2016  Referring MD/NP/PA: Merrily Pew, EDP PCP: Chevis Pretty, FNP  Patient coming from: Home  Chief Complaint: n/v, epigastric pain  HPI: Theresa Reyes is a 61 y.o. female who comes in today from home due to complaints of nausea vomiting and epigastric pain. This started a few months ago but has gotten a lot worse in the past 4 days. She had came to the emergency department 3 days ago at which time a CT scan of the abdomen and pelvis was relatively within normal limits. She was sent home on Phenergan and Bentyl. She has a history of a laparoscopic cholecystectomy in March 2017. She has had significant burning type pain in the middle of her chest, worse while lying down or after heavy meals, has noticed increased belching. In the ED blood work shows a potassium of 2.6, acute renal failure with a creatinine of 1.73 which is up from 0.69 just 3 days ago. Admission has been requested.  Past Medical/Surgical History: Past Medical History:  Diagnosis Date  . ADD (attention deficit disorder)    adult  . Depression     Past Surgical History:  Procedure Laterality Date  . ECTOPIC PREGNANCY SURGERY      Social History:  reports that she has been smoking Cigarettes.  She has been smoking about 1.00 pack per day. She has never used smokeless tobacco. She reports that she does not drink alcohol or use drugs.  Allergies: No Known Allergies  Family History:  Family History  Problem Relation Age of Onset  . Hypertension Mother   . Cancer Father     colon    Prior to Admission medications   Medication Sig Start Date End Date Taking? Authorizing Provider  dicyclomine (BENTYL) 20 MG tablet Take 1 tablet (20 mg total) by mouth 2 (two) times daily. 08/10/16  Yes Margette Fast, MD  escitalopram (LEXAPRO) 20 MG tablet Take 1 tablet (20 mg total) by mouth daily. 09/28/14  Yes Mary-Margaret Hassell Done, FNP    promethazine (PHENERGAN) 25 MG tablet Take 1 tablet (25 mg total) by mouth every 6 (six) hours as needed for nausea or vomiting. 08/10/16  Yes Margette Fast, MD  promethazine (PHENERGAN) 25 MG suppository Place 1 suppository (25 mg total) rectally every 6 (six) hours as needed for nausea or vomiting. 08/10/16   Margette Fast, MD    Review of Systems: Constitutional: Denies fever, chills, diaphoresis, appetite change and fatigue.  HEENT: Denies photophobia, eye pain, redness, hearing loss, ear pain, congestion, sore throat, rhinorrhea, sneezing, mouth sores, trouble swallowing, neck pain, neck stiffness and tinnitus.   Respiratory: Denies SOB, DOE, cough, chest tightness,  and wheezing.   Cardiovascular: Denies chest pain, palpitations and leg swelling.  Gastrointestinal: Denies  constipation, blood in stool and abdominal distention.  Genitourinary: Denies dysuria, urgency, frequency, hematuria, flank pain and difficulty urinating.  Endocrine: Denies: hot or cold intolerance, sweats, changes in hair or nails, polyuria, polydipsia. Musculoskeletal: Denies myalgias, back pain, joint swelling, arthralgias and gait problem.  Skin: Denies pallor, rash and wound.  Neurological: Denies dizziness, seizures, syncope, weakness, light-headedness, numbness and headaches.  Hematological: Denies adenopathy. Easy bruising, personal or family bleeding history  Psychiatric/Behavioral: Denies suicidal ideation, mood changes, confusion, nervousness, sleep disturbance and agitation    Physical Exam: Vitals:   08/13/16 1400 08/13/16 1451 08/13/16 1531 08/13/16 1548  BP: (!) 174/95 (!) 186/90  (!) 141/77  Pulse: 60 65  86  Resp: 10 12    Temp:  98.1 F (36.7 C)  98.3 F (36.8 C)  TempSrc:  Oral  Oral  SpO2: 100% 100%  100%  Weight:   81.8 kg (180 lb 4.8 oz)   Height:   5\' 6"  (1.676 m)       Constitutional: NAD, calm, comfortable Eyes: PERRL, lids and conjunctivae normal ENMT: Mucous membranes are  moist. Posterior pharynx clear of any exudate or lesions.Normal dentition.  Neck: normal, supple, no masses, no thyromegaly Respiratory: clear to auscultation bilaterally, no wheezing, no crackles. Normal respiratory effort. No accessory muscle use.  Cardiovascular: Regular rate and rhythm, no murmurs / rubs / gallops. No extremity edema. 2+ pedal pulses. No carotid bruits.  Abdomen: no tenderness, no masses palpated. No hepatosplenomegaly. Bowel sounds positive.  Musculoskeletal: no clubbing / cyanosis. No joint deformity upper and lower extremities. Good ROM, no contractures. Normal muscle tone.  Skin: no rashes, lesions, ulcers. No induration Neurologic: CN 2-12 grossly intact. Sensation intact, DTR normal. Strength 5/5 in all 4.  Psychiatric: Normal judgment and insight. Alert and oriented x 3. Normal mood.    Labs on Admission: I have personally reviewed the following labs and imaging studies  CBC:  Recent Labs Lab 08/10/16 1602 08/13/16 1116  WBC 18.7* 17.6*  HGB 15.4* 17.0*  HCT 44.5 47.8*  MCV 85.2 84.3  PLT 278 696   Basic Metabolic Panel:  Recent Labs Lab 08/10/16 1602 08/13/16 1116  NA 131* 131*  K 3.3* 2.6*  CL 98* 94*  CO2 24 24  GLUCOSE 128* 123*  BUN 14 29*  CREATININE 0.69 1.73*  CALCIUM 9.6 9.6   GFR: Estimated Creatinine Clearance: 37.3 mL/min (A) (by C-G formula based on SCr of 1.73 mg/dL (H)). Liver Function Tests:  Recent Labs Lab 08/10/16 1602  AST 21  ALT 16  ALKPHOS 63  BILITOT 0.5  PROT 8.2*  ALBUMIN 4.6    Recent Labs Lab 08/10/16 1602  LIPASE 15   No results for input(s): AMMONIA in the last 168 hours. Coagulation Profile: No results for input(s): INR, PROTIME in the last 168 hours. Cardiac Enzymes:  Recent Labs Lab 08/13/16 1116  TROPONINI 0.06*   BNP (last 3 results) No results for input(s): PROBNP in the last 8760 hours. HbA1C: No results for input(s): HGBA1C in the last 72 hours. CBG: No results for input(s):  GLUCAP in the last 168 hours. Lipid Profile: No results for input(s): CHOL, HDL, LDLCALC, TRIG, CHOLHDL, LDLDIRECT in the last 72 hours. Thyroid Function Tests: No results for input(s): TSH, T4TOTAL, FREET4, T3FREE, THYROIDAB in the last 72 hours. Anemia Panel: No results for input(s): VITAMINB12, FOLATE, FERRITIN, TIBC, IRON, RETICCTPCT in the last 72 hours. Urine analysis:    Component Value Date/Time   COLORURINE YELLOW 08/10/2016 1545   APPEARANCEUR CLEAR 08/10/2016 1545   APPEARANCEUR Clear 08/19/2015 1436   LABSPEC 1.014 08/10/2016 1545   PHURINE 6.0 08/10/2016 1545   GLUCOSEU NEGATIVE 08/10/2016 1545   HGBUR MODERATE (A) 08/10/2016 1545   BILIRUBINUR NEGATIVE 08/10/2016 1545   BILIRUBINUR Negative 08/19/2015 1436   KETONESUR 20 (A) 08/10/2016 1545   PROTEINUR 100 (A) 08/10/2016 1545   UROBILINOGEN 0.2 07/04/2013 1125   NITRITE NEGATIVE 08/10/2016 1545   LEUKOCYTESUR NEGATIVE 08/10/2016 1545   LEUKOCYTESUR 6-10 (A) 08/19/2015 1436   Sepsis Labs: @LABRCNTIP (procalcitonin:4,lacticidven:4) )No results found for this or any previous visit (from the past 240 hour(s)).   Radiological Exams on Admission: Dg Chest 2 View  Result Date: 08/13/2016  CLINICAL DATA:  Nausea, vomiting, acid reflux, central chest pain, tachycardia, and fever since Sunday, smoker EXAM: CHEST  2 VIEW COMPARISON:  08/10/2016 FINDINGS: Normal heart size, mediastinal contours, and pulmonary vascularity. Lungs well expanded and clear. No pleural effusion or pneumothorax. Bones unremarkable. IMPRESSION: Normal exam. Electronically Signed   By: Lavonia Dana M.D.   On: 08/13/2016 12:13    EKG: Independently reviewed. Sinus rhythm at a rate of 75, right bundle branch block, no acute ischemic abnormalities  Assessment/Plan Principal Problem:   AKI (acute kidney injury) ([place]) Active Problems:   N&V (nausea and vomiting)   Essential hypertension   Current smoker   Hypokalemia    Acute renal failure -Due to  prerenal azotemia and dehydration in the face of severe GI losses. -Give IV fluids, recheck renal function in a.m.  Hypokalemia -Due to prolonged and profound emesis. -Replace, check magnesium level.  Epigastric abdominal pain, nausea/vomiting -Description of her pain sounds like GERD, start Protonix, when necessary GI cocktail, when necessary Maalox. -If symptoms persist may need to consider GI consultation in the outpatient setting for consideration of EGD to further evaluate.   DVT prophylaxis: lovenox  Code Status: full code  Family Communication: husband at bedside updated on plan of care and questions answered  Disposition Plan: home in 24-48 hours  Consults called: none  Admission status: observation    Time Spent: 75 minutes  Lelon Frohlich MD [facility] Pager [phone]  If 7PM-7AM, please contact night-coverage [email or web address] Password [facility]  08/13/2016, 4:36 PM

## 2016-08-13 NOTE — ED Notes (Signed)
CRITICAL VALUE ALERT  Critical value received:  Trop 0.06  Date of notification:  08/13/16  Time of notification:  3601  Critical value read back:Yes.    Nurse who received alert:  RMinter, RN  MD notified (1st page):  Dr. Dayna Barker  Time of first page:  1153  MD notified (2nd page):  Time of second page:  Responding MD:  Dr. Dayna Barker  Time MD responded:  1153

## 2016-08-13 NOTE — ED Triage Notes (Signed)
Patient seen here in ER on Monday. For epigastric pain with nausea and vomiting. Per patient was not diagnosed with anything. Patient given phenergan and bentol. Patient states "I thought I was improving yesterday and had a little to eat but pain has increased again today and vomiting is back". Patient states chest pain, nausea, vomiting, weakness, and diaphoresis- "feels like heart is pounding."

## 2016-08-13 NOTE — ED Notes (Signed)
CRITICAL VALUE ALERT  Critical value received:  Potassium 2.6  Date of notification:  08/13/16  Time of notification:  1142  Critical value read back:Yes.    Nurse who received alert:  RMinter, Rn  MD notified (1st page):  Dr. Dayna Barker  Time of first page:  1143  MD notified (2nd page):  Time of second page:  Responding MD:  Dr. Dayna Barker  Time MD responded:  [phone]

## 2016-08-13 NOTE — ED Provider Notes (Signed)
[facility] DEPT Provider Note   CSN: [id number] Arrival date & time: 08/13/16  1038  By signing my name below, I, Ethelle Lyon Long, attest that this documentation has been prepared under the direction and in the presence of Merrily Pew, MD. Electronically Signed: Ethelle Lyon Long, Scribe. 08/13/2016. 11:58 AM.  History   Chief Complaint Chief Complaint  Patient presents with  . Chest Pain   The history is provided by the patient and medical records. No language interpreter was used.   HPI Comments:  Theresa Reyes is a 61 y.o. female with a PMHx of ADD and Depression s/p cholecystectomy and partial hepatectomy one year ago, who presents to the Emergency Department complaining of ongoing epigastric abdominal pain and vomiting beginning four days ago. Per chart review, pt was seen at [facility] three days ago for same and had a CT A/P with contrast and treated with IVF, IV Morphine, Bentyl, and Zofran. She states she felt mildly relieved yesterday; however, the associated nausea and bilious emesis returned and increased today feeling similar to her pain prior to her abdominal surgeries stating "it feels like there's a hole burning in my stomach". She has reduced PO intake, not being able to keep down much food but had some success with ginger ale. Pt has additional associated symptoms of burning, centralized CP that radiates up her esophagus, RUQ abdominal pain, mild abdominal rash, and generalized weakness. She tried the Rx'd Phenergan and Bentyl at home with moderate relief of her symptoms but states they did not help her this morning. She additionally reports a GI cocktail she received in the past relieved her of these symptoms. Pt denies dysuria, hematuria, and any other complaints at this time. Pt is a current every day smoker.   Past Medical History:  Diagnosis Date  . ADD (attention deficit disorder)    adult  . Depression    Patient Active Problem List   Diagnosis Date Noted  . AKI  (acute kidney injury) ([place]) 08/13/2016  . Menopausal sweats 09/05/2015  . Anxiety 08/08/2015  . Current smoker 07/29/2015  . Abdominal pain, right upper quadrant 07/29/2015  . Liver mass 07/11/2015  . Essential hypertension 09/28/2014  . Depression 03/23/2014  . ADHD (attention deficit hyperactivity disorder) 11/15/2012   Past Surgical History:  Procedure Laterality Date  . ECTOPIC PREGNANCY SURGERY     OB History    No data available     Home Medications    Prior to Admission medications   Medication Sig Start Date End Date Taking? Authorizing Provider  dicyclomine (BENTYL) 20 MG tablet Take 1 tablet (20 mg total) by mouth 2 (two) times daily. 08/10/16  Yes Margette Fast, MD  escitalopram (LEXAPRO) 20 MG tablet Take 1 tablet (20 mg total) by mouth daily. 09/28/14  Yes Mary-Margaret Hassell Done, FNP  promethazine (PHENERGAN) 25 MG tablet Take 1 tablet (25 mg total) by mouth every 6 (six) hours as needed for nausea or vomiting. 08/10/16  Yes Margette Fast, MD  promethazine (PHENERGAN) 25 MG suppository Place 1 suppository (25 mg total) rectally every 6 (six) hours as needed for nausea or vomiting. 08/10/16   Margette Fast, MD    Family History Family History  Problem Relation Age of Onset  . Hypertension Mother   . Cancer Father     colon    Social History Social History  Substance Use Topics  . Smoking status: Current Every Day Smoker    Packs/day: 1.00    Types: Cigarettes  .  Smokeless tobacco: Never Used  . Alcohol use No     Allergies   Patient has no known allergies.   Review of Systems Review of Systems  Cardiovascular: Positive for chest pain (centralized, radiates up esophagus).  Gastrointestinal: Positive for abdominal pain, nausea and vomiting.  Genitourinary: Negative for dysuria and hematuria.  Skin: Positive for color change.  Neurological: Positive for weakness (generalized).  All other systems reviewed and are negative.   Physical Exam Updated Vital  Signs BP (!) 186/90 (BP Location: Left Arm)   Pulse 65   Temp 98.1 F (36.7 C) (Oral)   Resp 12   Ht 5\' 6"  (1.676 m)   Wt 179 lb (81.2 kg)   SpO2 100%   BMI 28.89 kg/m   Physical Exam  Constitutional: She is oriented to person, place, and time. She appears well-developed and well-nourished.  HENT:  Head: Normocephalic.   Mucus membranes were dry.   Eyes: Conjunctivae are normal.  Cardiovascular: Normal rate.   Pulmonary/Chest: Effort normal.  Abdominal: She exhibits no distension.  Vital signs normal. No rebounding or guarding.  Musculoskeletal: Normal range of motion.  Neurological: She is alert and oriented to person, place, and time.  Skin: Skin is warm and dry.  Petechiae on abdomen and chest.  Psychiatric: She has a normal mood and affect.  Nursing note and vitals reviewed.  ED Treatments / Results  DIAGNOSTIC STUDIES:  Oxygen Saturation is 100% on RA, normal by my interpretation.    COORDINATION OF CARE:  11:56 AM Discussed treatment plan with pt at bedside including EKG, blood work, [place] A/P, and pt agreed to plan.  Labs (all labs ordered are listed, but only abnormal results are displayed) Labs Reviewed  BASIC METABOLIC PANEL - Abnormal; Notable for the following:       Result Value   Sodium 131 (*)    Potassium 2.6 (*)    Chloride 94 (*)    Glucose, Bld 123 (*)    BUN 29 (*)    Creatinine, Ser 1.73 (*)    GFR calc non Af Amer 31 (*)    GFR calc Af Amer 36 (*)    All other components within normal limits  CBC - Abnormal; Notable for the following:    WBC 17.6 (*)    RBC 5.67 (*)    Hemoglobin 17.0 (*)    HCT 47.8 (*)    All other components within normal limits  TROPONIN I - Abnormal; Notable for the following:    Troponin I 0.06 (*)    All other components within normal limits  H. PYLORI ANTIBODY, IGG    EKG  EKG Interpretation  Date/Time:  Thursday August 13 2016 10:45:08 EDT Ventricular Rate:  75 PR Interval:  128 QRS Duration: 130 QT  Interval:  434 QTC Calculation: 484 R Axis:   94 Text Interpretation:  Normal sinus rhythm Right bundle branch block T wave abnormality, consider inferior ischemia Abnormal ECG No significant change since last tracing Confirmed by [facility] MD, Corene Cornea [phone]) on 08/13/2016 10:59:27 AM       Radiology Dg Chest 2 View  Result Date: 08/13/2016 CLINICAL DATA:  Nausea, vomiting, acid reflux, central chest pain, tachycardia, and fever since Sunday, smoker EXAM: CHEST  2 VIEW COMPARISON:  08/10/2016 FINDINGS: Normal heart size, mediastinal contours, and pulmonary vascularity. Lungs well expanded and clear. No pleural effusion or pneumothorax. Bones unremarkable. IMPRESSION: Normal exam. Electronically Signed   By: Lavonia Dana M.D.   On: 08/13/2016 12:13  Procedures Procedures (including critical care time)  CRITICAL CARE Performed by: Merrily Pew Total critical care time: 35 minutes Critical care time was exclusive of separately billable procedures and treating other patients. Critical care was necessary to treat or prevent imminent or life-threatening deterioration. Critical care was time spent personally by me on the following activities: development of treatment plan with patient and/or surrogate as well as nursing, discussions with consultants, evaluation of patient's response to treatment, examination of patient, obtaining history from patient or surrogate, ordering and performing treatments and interventions, ordering and review of laboratory studies, ordering and review of radiographic studies, pulse oximetry and re-evaluation of patient's condition.   Medications Ordered in ED Medications  potassium chloride 10 mEq in 100 mL IVPB (10 mEq Intravenous New Bag/Given 08/13/16 1414)  gi cocktail (Maalox,Lidocaine,Donnatal) (30 mLs Oral Given 08/13/16 1226)  sodium chloride 0.9 % bolus 1,000 mL (0 mLs Intravenous Stopped 08/13/16 1413)  promethazine (PHENERGAN) injection 25 mg (25 mg Intravenous  Given 08/13/16 1228)  famotidine (PEPCID) IVPB 20 mg premix (0 mg Intravenous Stopped 08/13/16 1358)  fentaNYL (SUBLIMAZE) injection 50 mcg (50 mcg Intravenous Given 08/13/16 1228)  magnesium sulfate IVPB 2 g 50 mL (0 g Intravenous Stopped 08/13/16 1418)  aspirin chewable tablet 324 mg (324 mg Oral Given 08/13/16 1227)  potassium chloride 20 MEQ/15ML (10%) solution 40 mEq (40 mEq Oral Given 08/13/16 1227)     Initial Impression / Assessment and Plan / ED Course  I have reviewed the triage vital signs and the nursing notes.  Pertinent labs & imaging results that were available during my care of the patient were reviewed by me and considered in my medical decision making (see chart for details).     61 year old female with likely severe reflux causing her to have vomiting and burning sensation in her chest for the last few days to the point where she is hypokalemic and has acute kidney injury. Kidney injury also may be related to recent contrasted scan that she had done likely in combination with the dehydration. GI cocktail helped improve her symptoms. Elevated troponin as well, but unchanged ECG so is more likely demand ischemia and related to the kidney injury. Patient otherwise stable we'll admit the hospital for fluid resuscitation and replenishment of electrolytes.  Final Clinical Impressions(s) / ED Diagnoses   Final diagnoses:  Hypokalemia  Dehydration  AKI (acute kidney injury) ([place])  Elevated troponin    New Prescriptions New Prescriptions   No medications on file   I personally performed the services described in this documentation, which was scribed in my presence. The recorded information has been reviewed and is accurate.      Merrily Pew, MD 08/13/16 [phone]

## 2016-08-14 DIAGNOSIS — N179 Acute kidney failure, unspecified: Secondary | ICD-10-CM | POA: Diagnosis not present

## 2016-08-14 LAB — BASIC METABOLIC PANEL
Anion gap: 5 (ref 5–15)
BUN: 19 mg/dL (ref 6–20)
CO2: 23 mmol/L (ref 22–32)
Calcium: 8.2 mg/dL — ABNORMAL LOW (ref 8.9–10.3)
Chloride: 111 mmol/L (ref 101–111)
Creatinine, Ser: 0.94 mg/dL (ref 0.44–1.00)
GFR calc non Af Amer: >60 mL/min (ref >60)
Glucose, Bld: 90 mg/dL (ref 65–99)
POTASSIUM: 4.6 mmol/L (ref 3.5–5.1)
SODIUM: 139 mmol/L (ref 135–145)

## 2016-08-14 LAB — H. PYLORI ANTIBODY, IGG

## 2016-08-14 LAB — HIV ANTIBODY (ROUTINE TESTING W REFLEX): HIV Screen 4th Generation wRfx: NONREACTIVE

## 2016-08-14 MED ORDER — PANTOPRAZOLE SODIUM 40 MG PO TBEC: 40.0000 mg | DELAYED_RELEASE_TABLET | Freq: Every day | ORAL | 2 refills | Status: DC

## 2016-08-14 NOTE — Progress Notes (Signed)
Discharge instructions given, verbalized understanding, out in stable condition via w/c with staff. 

## 2016-08-14 NOTE — Care Management Note (Signed)
Case Management Note  Patient Details  Name: Theresa Reyes MRN: [id number] Date of Birth: 06/28/1955  Subjective/Objective:                  Admitted with AKI. Chart reviewed for CM needs. Pt from home, lives with spouse and is ind with ADL's. Pt has PCP, transportation and no difficulty affording medications. Has no HH or DME need pta.  Action/Plan: Plan for return home with self care.   Expected Discharge Date:     08/14/2016             Expected Discharge Plan:  Home/Self Care  In-House Referral:  NA  Discharge planning Services  CM Consult  Post Acute Care Choice:  NA Choice offered to:  NA  Status of Service:  Completed, signed off  Sherald Barge, RN 08/14/2016, 9:43 AM

## 2016-08-14 NOTE — Discharge Summary (Signed)
Physician Discharge Summary  Theresa Reyes LGX:[id number] DOB: [birth year] DOA: 08/13/2016  PCP: Chevis Pretty, FNP  Admit date: 08/13/2016 Discharge date: 08/14/2016  Time spent: 45 minutes  Recommendations for Outpatient Follow-up:  -Will be discharged home today. -Advised to follow up with PCP in 2 weeks.   Discharge Diagnoses:  Principal Problem:   AKI (acute kidney injury) ([place]) Active Problems:   N&V (nausea and vomiting)   Essential hypertension   Current smoker   Hypokalemia   Discharge Condition: Stable and improved  Filed Weights   08/13/16 1052 08/13/16 1531  Weight: 81.2 kg (179 lb) 81.8 kg (180 lb 4.8 oz)    History of present illness:  Theresa Reyes is a 61 y.o. female who comes in today from home due to complaints of nausea vomiting and epigastric pain. This started a few months ago but has gotten a lot worse in the past 4 days. She had came to the emergency department 3 days ago at which time a CT scan of the abdomen and pelvis was relatively within normal limits. She was sent home on Phenergan and Bentyl. She has a history of a laparoscopic cholecystectomy in March 2017. She has had significant burning type pain in the middle of her chest, worse while lying down or after heavy meals, has noticed increased belching. In the ED blood work shows a potassium of 2.6, acute renal failure with a creatinine of 1.73 which is up from 0.69 just 3 days ago. Admission has been requested.  Hospital Course:   ARF -Resolved. -Due to prerenal azotemia in the face of severe GI losses.  Hypokalemia -Replaced  Epigastric pain, N/V -suspect GERD. -Resolved with PPI.    Procedures:  None   Consultations:  None  Discharge Instructions  Discharge Instructions    Increase activity slowly    Complete by:  As directed      Allergies as of 08/14/2016   No Known Allergies     Medication List    TAKE these medications   dicyclomine 20 MG  tablet Commonly known as:  BENTYL Take 1 tablet (20 mg total) by mouth 2 (two) times daily.   escitalopram 20 MG tablet Commonly known as:  LEXAPRO Take 1 tablet (20 mg total) by mouth daily.   pantoprazole 40 MG tablet Commonly known as:  PROTONIX Take 1 tablet (40 mg total) by mouth daily. Start taking on:  08/15/2016   promethazine 25 MG tablet Commonly known as:  PHENERGAN Take 1 tablet (25 mg total) by mouth every 6 (six) hours as needed for nausea or vomiting. What changed:  Another medication with the same name was removed. Continue taking this medication, and follow the directions you see here.      No Known Allergies Follow-up Information    Mary-Margaret Hassell Done, FNP. Schedule an appointment as soon as possible for a visit in 2 week(s).   Specialty:  Family Medicine Contact information: [place] [place] [address] [phone]            The results of significant diagnostics from this hospitalization (including imaging, microbiology, ancillary and laboratory) are listed below for reference.    Significant Diagnostic Studies: Dg Chest 2 View  Result Date: 08/13/2016 CLINICAL DATA:  Nausea, vomiting, acid reflux, central chest pain, tachycardia, and fever since Sunday, smoker EXAM: CHEST  2 VIEW COMPARISON:  08/10/2016 FINDINGS: Normal heart size, mediastinal contours, and pulmonary vascularity. Lungs well expanded and clear. No pleural effusion or pneumothorax. Bones unremarkable.  IMPRESSION: Normal exam. Electronically Signed   By: Lavonia Dana M.D.   On: 08/13/2016 12:13   Dg Chest 2 View  Result Date: 08/10/2016 CLINICAL DATA:  RIGHT upper quadrant pain and vomiting today, fever EXAM: CHEST  2 VIEW COMPARISON:  07/04/2013 FINDINGS: Normal heart size, mediastinal contours, and pulmonary vascularity. Lungs clear. No pleural effusion or pneumothorax. Bones unremarkable. IMPRESSION: No acute abnormalities. Electronically Signed   By: Lavonia Dana M.D.    On: 08/10/2016 17:47   Ct Abdomen Pelvis W Contrast  Result Date: 08/10/2016 CLINICAL DATA:  Right upper quadrant abdominal pain and vomiting for 2 days. Reported history of partial liver resection and cholecystectomy. EXAM: CT ABDOMEN AND PELVIS WITH CONTRAST TECHNIQUE: Multidetector CT imaging of the abdomen and pelvis was performed using the standard protocol following bolus administration of intravenous contrast. CONTRAST:  167mL ISOVUE-300 IOPAMIDOL (ISOVUE-300) INJECTION 61% COMPARISON:  07/04/2013 right upper quadrant abdominal sonogram. FINDINGS: Lower chest: No significant pulmonary nodules or acute consolidative airspace disease. Hepatobiliary: Surgical clips are noted adjacent to the inferior right liver lobe with contour irregularity in this location suggestive of subtotal inferior right hepatectomy. Two tiny hypodense 0.3 cm right lower lobe lesions are too small to characterize. No additional liver lesions. Cholecystectomy. Mild diffuse central intrahepatic biliary ductal dilatation and common bile duct diameter 8 mm are within expected post cholecystectomy limits. No radiopaque choledocholithiasis. Pancreas: Normal, with no mass or duct dilation. Spleen: Normal size. No mass. Adrenals/Urinary Tract: Irregular thickening of the left greater than right adrenal glands without discrete adrenal nodules. Ill-defined perinephric fluid symmetrically involving both kidneys. No renal masses. No hydronephrosis. Normal bladder. Stomach/Bowel: Small hiatal hernia. Otherwise collapsed and grossly normal stomach. Normal caliber small bowel with no small bowel wall thickening. Normal appendix . Mild sigmoid diverticulosis. No large bowel wall thickening or pericolonic fat stranding. Oral contrast progresses to the distal small bowel. Vascular/Lymphatic: Atherosclerotic nonaneurysmal abdominal aorta. Patent portal, splenic, hepatic and renal veins. No pathologically enlarged lymph nodes in the abdomen or pelvis.  Reproductive: Grossly normal uterus.  No adnexal mass. Other: No pneumoperitoneum, ascites or focal fluid collection. Musculoskeletal: No aggressive appearing focal osseous lesions. Mild thoracolumbar spondylosis. IMPRESSION: 1. Nonspecific symmetric bilateral perinephric fluid. While this may be a chronic finding, acute pyelonephritis cannot be excluded and clinical correlation is necessary. No hydronephrosis. No renal masses. Normal bladder. 2. Postsurgical changes in the inferior right liver lobe. No liver masses. 3. Cholecystectomy. Bile ducts are within expected post cholecystectomy limits . 4. Mild sigmoid diverticulosis, with no evidence of acute diverticulitis. No evidence of bowel obstruction or acute bowel inflammation. Normal appendix. 5. Aortic atherosclerosis. 6. Small hiatal hernia. Electronically Signed   By: Ilona Sorrel M.D.   On: 08/10/2016 19:25    Microbiology: No results found for this or any previous visit (from the past 240 hour(s)).   Labs: Basic Metabolic Panel:  Recent Labs Lab 08/10/16 1602 08/13/16 1116 08/13/16 1651 08/14/16 0516  NA 131* 131*  --  139  K 3.3* 2.6*  --  4.6  CL 98* 94*  --  111  CO2 24 24  --  23  GLUCOSE 128* 123*  --  90  BUN 14 29*  --  19  CREATININE 0.69 1.73*  --  0.94  CALCIUM 9.6 9.6  --  8.2*  MG  --   --  2.4  --    Liver Function Tests:  Recent Labs Lab 08/10/16 1602  AST 21  ALT 16  ALKPHOS 63  BILITOT 0.5  PROT 8.2*  ALBUMIN 4.6    Recent Labs Lab 08/10/16 1602  LIPASE 15   No results for input(s): AMMONIA in the last 168 hours. CBC:  Recent Labs Lab 08/10/16 1602 08/13/16 1116  WBC 18.7* 17.6*  HGB 15.4* 17.0*  HCT 44.5 47.8*  MCV 85.2 84.3  PLT 278 287   Cardiac Enzymes:  Recent Labs Lab 08/13/16 1116  TROPONINI 0.06*   BNP: BNP (last 3 results) No results for input(s): BNP in the last 8760 hours.  ProBNP (last 3 results) No results for input(s): PROBNP in the last 8760 hours.  CBG: No  results for input(s): GLUCAP in the last 168 hours.     SignedLelon Frohlich  [facility] Pager: [phone] 08/14/2016, 5:40 PM

## 2016-08-17 ENCOUNTER — Encounter: Payer: Self-pay | Admitting: Internal Medicine

## 2016-08-19 ENCOUNTER — Telehealth: Payer: Self-pay | Admitting: Family Medicine

## 2016-08-19 NOTE — Telephone Encounter (Signed)
Patient has a follow up appointment scheduled. 

## 2016-08-20 ENCOUNTER — Ambulatory Visit (INDEPENDENT_AMBULATORY_CARE_PROVIDER_SITE_OTHER): Payer: Self-pay | Admitting: Nurse Practitioner

## 2016-08-20 ENCOUNTER — Encounter: Payer: Self-pay | Admitting: Nurse Practitioner

## 2016-08-20 VITALS — BP 133/75 | HR 62 | Temp 97.9°F | Ht 66.0 in | Wt 186.0 lb

## 2016-08-20 DIAGNOSIS — R1011 Right upper quadrant pain: Secondary | ICD-10-CM

## 2016-08-20 DIAGNOSIS — N179 Acute kidney failure, unspecified: Secondary | ICD-10-CM

## 2016-08-20 DIAGNOSIS — Z09 Encounter for follow-up examination after completed treatment for conditions other than malignant neoplasm: Secondary | ICD-10-CM

## 2016-08-20 DIAGNOSIS — R112 Nausea with vomiting, unspecified: Secondary | ICD-10-CM

## 2016-08-20 NOTE — Progress Notes (Signed)
   Subjective:    Patient ID: Theresa Reyes, female    DOB: 31-May-1955, 61 y.o.   MRN: [id number]  HPI  Patient comes in today for hospital follow up. SHe has been in the hospital on 3 different occasions in the last year with violent nausea, vomiting and diarhea. Hennie Duos can never figure out what is causing this. The last occurrence was last week. SHe was admitted to hospital and while she was there she went into acute renal failure which caused some heart problems. SHe was discharged last week and is much better. Her lase creatine on 08/14/16 was .94. She says she constantly feels nauseous - has appointment with GI on 09/03/16. She was discharged from the hospital on bentyl and protnix  Review of Systems  Constitutional: Negative.   HENT: Negative.   Respiratory: Negative.   Cardiovascular: Negative.   Gastrointestinal: Positive for abdominal pain and nausea.  Genitourinary: Negative.   Neurological: Negative.   Psychiatric/Behavioral: Negative.   All other systems reviewed and are negative.      Objective:   Physical Exam  Constitutional: She is oriented to person, place, and time. She appears well-developed and well-nourished. No distress.  Cardiovascular: Normal rate and regular rhythm.   Pulmonary/Chest: Effort normal and breath sounds normal.  Abdominal: Soft. There is tenderness (right upper quadrant).  Neurological: She is alert and oriented to person, place, and time.  Skin: Skin is warm.  Psychiatric: She has a normal mood and affect. Her behavior is normal. Judgment and thought content normal.   BP 133/75 (BP Location: Left Arm)   Pulse 62   Temp 97.9 F (36.6 C) (Oral)   Ht 5\' 6"  (1.676 m)   Wt 186 lb (84.4 kg)   BMI 30.02 kg/m       Assessment & Plan:   1. Hospital discharge follow-up   2. Nausea and vomiting, intractability of vomiting not specified, unspecified vomiting type   3. Abdominal pain, right upper quadrant   4. AKI (acute kidney injury) ([place])     Continue all meds Bland diet Keep appointment with GI RTO prn  Mary-Margaret Hassell Done, FNP

## 2016-08-20 NOTE — Patient Instructions (Signed)
Nausea, Adult Feeling sick to your stomach (nausea) means that your stomach is upset or you feel like you have to throw up (vomit). Feeling sick to your stomach is usually not serious, but it may be an early sign of a more serious medical problem. As you feel sicker to your stomach, it can lead to throwing up (vomiting). If you throw up, or if you are not able to drink enough fluids, there is a risk of dehydration. Dehydration can make you feel tired and thirsty, have a dry mouth, and pee (urinate) less often. Older adults and people who have other diseases or a weak defense (immune) system have a higher risk of dehydration. The main goal of treating this condition is to:  Limit how often you feel sick to your stomach.  Prevent throwing up and dehydration. Follow these instructions at home: Follow instructions from your doctor about how to care for yourself at home. Eating and drinking  Follow these recommendations as told by your doctor:  Take an oral rehydration solution (ORS). This is a drink that is sold at pharmacies and stores.  Drink clear fluids in small amounts as you are able, such as:  Water.  Ice chips.  Fruit juice that has water added (diluted fruit juice).  Low-calorie sports drinks.  Eat bland, easy to digest foods in small amounts as you are able, such as:  Bananas.  Applesauce.  Rice.  Lean meats.  Toast.  Crackers.  Avoid drinking fluids that contain a lot of sugar or caffeine.  Avoid alcohol.  Avoid spicy or fatty foods. General instructions   Drink enough fluid to keep your pee (urine) clear or pale yellow.  Wash your hands often. If you cannot use soap and water, use hand sanitizer.  Make sure that all people in your household wash their hands well and often.  Rest at home while you get better.  Take over-the-counter and prescription medicines only as told by your doctor.  Breathe slowly and deeply when you feel sick to your  stomach.  Watch your condition for any changes.  Keep all follow-up visits as told by your doctor. This is important. Contact a doctor if:  You have a headache.  You have new symptoms.  You feel sicker to your stomach.  You have a fever.  You feel light-headed or dizzy.  You throw up.  You are not able to keep fluids down. Get help right away if:  You have pain in your chest, neck, arm, or jaw.  You feel very weak or you pass out (faint).  You have throw up that is bright red or looks like coffee grounds.  You have bloody or black poop (stools), or poop that looks like tar.  You have a very bad headache, a stiff neck, or both.  You have very bad pain, cramping, or bloating in your belly.  You have a rash.  You have trouble breathing or you are breathing very quickly.  Your heart is beating very quickly.  Your skin feels cold and clammy.  You feel confused.  You have pain while peeing.  You have signs of dehydration, such as:  Dark pee, or very little or no pee.  Cracked lips.  Dry mouth.  Sunken eyes.  Sleepiness.  Weakness. These symptoms may be an emergency. Do not wait to see if the symptoms will go away. Get medical help right away. Call your local emergency services (911 in the [place] Do not drive yourself to  the hospital. This information is not intended to replace advice given to you by your health care provider. Make sure you discuss any questions you have with your health care provider. Document Released: 04/30/2011 Document Revised: 10/17/2015 Document Reviewed: 01/15/2015 Elsevier Interactive Patient Education  2017 [employer].

## 2016-09-03 ENCOUNTER — Ambulatory Visit (INDEPENDENT_AMBULATORY_CARE_PROVIDER_SITE_OTHER): Payer: PRIVATE HEALTH INSURANCE | Admitting: Nurse Practitioner

## 2016-09-03 ENCOUNTER — Encounter: Payer: Self-pay | Admitting: Nurse Practitioner

## 2016-09-03 VITALS — BP 181/93 | HR 53 | Temp 97.9°F | Ht 66.0 in | Wt 181.2 lb

## 2016-09-03 DIAGNOSIS — R109 Unspecified abdominal pain: Secondary | ICD-10-CM | POA: Insufficient documentation

## 2016-09-03 DIAGNOSIS — R112 Nausea with vomiting, unspecified: Secondary | ICD-10-CM | POA: Diagnosis not present

## 2016-09-03 MED ORDER — PANTOPRAZOLE SODIUM 40 MG PO TBEC: 40.0000 mg | DELAYED_RELEASE_TABLET | Freq: Every day | ORAL | 2 refills | Status: DC

## 2016-09-03 NOTE — Patient Instructions (Signed)
1. I have printed a prescription for Protonix. Take this for 3 months. 2. Return for follow-up in 2 months. 3. Call if worsening symptoms. 4. We will schedule your colonoscopy as soon as possible when you straighten out your health insurance with your employer.

## 2016-09-03 NOTE — Assessment & Plan Note (Signed)
Patient with epigastric pain as well as nausea and vomiting. Her pain and vomiting seems to occur together and in a cyclic fashion with recurrences since October 2017. Denies melena. Denies typical GERD symptoms. Silent reflux may be contributing. Multiple CTs and blood work has not identified a source of her problem. She has had cholecystectomy last year along with partial hepatectomy related to liver mass. At this point I will start her on Protonix daily to see if this helps her pain. Call if worsening symptoms, return for follow-up in 2 months.

## 2016-09-03 NOTE — Progress Notes (Signed)
Primary Care Physician:  Chevis Pretty, FNP Primary Gastroenterologist:  Dr. Gala Romney  Chief Complaint  Patient presents with  . Abdominal Pain    RUQ, had gb/right lobe liver removed 07/2015  . Emesis    3x since 02/2016, ends up in hospital    HPI:   Theresa Reyes is a 61 y.o. female who presents on referral from PCP for abdominal pain. PCP notes reviewed. Last saw PCP for this issue 08/10/16 and noted RUQ abdominal pain (cholecystectomy last year), vomiting for the prior 2 days, unable to eat or drink, unable to keep down either oral or SL Zofran. Also noted headache and decreased urination. Patient noted similar symptoms the previous year when she had liver cancer and cholecystectomy. She was referred to the ED at [facility].   ED notes indicate patient had a fever at PCP office and PCP office started IV and gave Phenergan but patient continued with dry heaving. In the ED described epigastric pain and lower central chest pain worse with vomiting. CMP consistent with dehydration. CBC with leucocytosis and WBC 18.7. Likely hemoconcentration with elevated hgb 15.4. Lipase normal, iSTAT troponin negative. EKG with SR and RBBB no STEMI. Chest XRay normal. CT Abdomen/Pelvis with contrast found Nonspecific symmetric bilateral perinephric fluid which may be chronic finding or acute pyelonephritis and recommend clinical correlation, no hydronephrosis or renal masses. Postsurgical changes in the inferior right liver lobe without liver masses noted. Cholecystectomy with bile ducts within post cholecystectomy limits. Mild sigmoid diverticulosis without acute diverticulitis, no bowel obstruction or acute bowel inflammation. After fluids, pain medicines, nausea medicines patient was comfortable with discharge. Suspected diagnosis of gastroenteritis.  Today she states she's doing ok. Her episode with RUQ pain and vomiting requiring admission has happened 3 times since October 2017. Ended up back in the  hospital 08/13/16 for dehydration and AKI. Every time this happens she states she is "down for 7-10 days" and this is interfering with work and life. She has done well since discharge. She is now on Protonix, is avoiding caffeine, fried foods. Has occasional red blood in her stools last episode with loose stools about a month ago. Denies melena. Denies unintentional weight loss, fever, chills. Denies typical GERD symptoms. Denies chest pain, dyspnea, dizziness, lightheadedness, syncope, near syncope. Denies any other upper or lower GI symptoms.  States her father died of colon cancer. She has never had a colonoscopy. Notes   Past Medical History:  Diagnosis Date  . ADD (attention deficit disorder)    adult  . Depression   . Liver mass    S/P resection and partial hepatioma 2017 at [facility]    Past Surgical History:  Procedure Laterality Date  . CHOLECYSTECTOMY    . ECTOPIC PREGNANCY SURGERY    . OPEN PARTIAL HEPATECTOMY   2017    Current Outpatient Prescriptions  Medication Sig Dispense Refill  . escitalopram (LEXAPRO) 20 MG tablet Take 1 tablet (20 mg total) by mouth daily. 90 tablet 0  . pantoprazole (PROTONIX) 40 MG tablet Take 1 tablet (40 mg total) by mouth daily. 30 tablet 2  . promethazine (PHENERGAN) 25 MG tablet Take 1 tablet (25 mg total) by mouth every 6 (six) hours as needed for nausea or vomiting. 30 tablet 0   No current facility-administered medications for this visit.     Allergies as of 09/03/2016  . (No Known Allergies)    Family History  Problem Relation Age of Onset  . Hypertension Mother   . Colon cancer  Father 29    Deceased age 48 from colon ca    Social History   Social History  . Marital status: Married    Spouse name: N/A  . Number of children: N/A  . Years of education: N/A   Occupational History  . Not on file.   Social History Main Topics  . Smoking status: Current Every Day Smoker    Packs/day: 1.00    Types: Cigarettes  . Smokeless  tobacco: Never Used  . Alcohol use Yes     Comment: Occasional/socially  . Drug use: No  . Sexual activity: Not on file   Other Topics Concern  . Not on file   Social History Narrative  . No narrative on file    Review of Systems: General: Negative for anorexia, weight loss, fever, chills, fatigue, weakness. ENT: Negative for hoarseness, difficulty swallowing. CV: Negative for chest pain, angina, palpitations, peripheral edema.  Respiratory: Negative for dyspnea at rest, cough, sputum, wheezing.  GI: See history of present illness. MS: Negative for joint pain, low back pain.  Derm: Negative for rash or itching.  Endo: Negative for unusual weight change.  Heme: Negative for bruising or bleeding. Allergy: Negative for rash or hives.    Physical Exam: BP (!) 181/93   Pulse (!) 53   Temp 97.9 F (36.6 C) (Oral)   Ht 5\' 6"  (1.676 m)   Wt 181 lb 3.2 oz (82.2 kg)   BMI 29.25 kg/m  General:   Alert and oriented. Pleasant and cooperative. Well-nourished and well-developed.  Head:  Normocephalic and atraumatic. Eyes:  Without icterus, sclera clear and conjunctiva pink.  Ears:  Normal auditory acuity. Cardiovascular:  S1, S2 present without murmurs appreciated. Extremities without clubbing or edema. Respiratory:  Clear to auscultation bilaterally. No wheezes, rales, or rhonchi. No distress.  Gastrointestinal:  +BS, soft, and non-distended. Minimal epigastric TTP noted. No HSM noted. No guarding or rebound. No masses appreciated.  Rectal:  Deferred  Musculoskalatal:  Symmetrical without gross deformities Neurologic:  Alert and oriented x4;  grossly normal neurologically. Psych:  Alert and cooperative. Normal mood and affect. Heme/Lymph/Immune: No excessive bruising noted.    09/03/2016 10:38 AM   Disclaimer: This note was dictated with voice recognition software. Similar sounding words can inadvertently be transcribed and may not be corrected upon review.

## 2016-09-03 NOTE — Assessment & Plan Note (Signed)
Complains of right upper quadrant pain although on exam she does not have tenderness to her right upper quadrant but rather her epigastric area. Epigastric pain associated with nausea and vomiting but without reflux symptoms include possible differential such as gastritis, esophagitis, duodenitis, peptic ulcer disease, duodenal ulcer. Her abdominal pain does seem to worsen after vomiting and it could also be musculoskeletal in origin. At this point I will trial her on PPI as per above to see if this helps her symptoms. Return for follow-up in 2 months.  Of note she is overdue for colonoscopy as she has never had one before and is 61 years old. Recommend colonoscopy as soon as possible, although she is waiting for her employer to straighten out her insurance coverage issues.

## 2016-09-03 NOTE — Progress Notes (Signed)
cc'ed to pcp °

## 2016-09-28 ENCOUNTER — Other Ambulatory Visit: Payer: Self-pay | Admitting: Nurse Practitioner

## 2016-10-08 ENCOUNTER — Ambulatory Visit: Payer: Self-pay | Admitting: Family Medicine

## 2016-11-03 ENCOUNTER — Ambulatory Visit: Payer: PRIVATE HEALTH INSURANCE | Admitting: Nurse Practitioner

## 2016-11-11 ENCOUNTER — Ambulatory Visit (INDEPENDENT_AMBULATORY_CARE_PROVIDER_SITE_OTHER): Payer: Self-pay | Admitting: Family Medicine

## 2016-11-11 ENCOUNTER — Encounter: Payer: Self-pay | Admitting: Family Medicine

## 2016-11-11 VITALS — BP 137/84 | HR 75 | Temp 99.5°F | Ht 66.0 in | Wt 176.0 lb

## 2016-11-11 DIAGNOSIS — R112 Nausea with vomiting, unspecified: Secondary | ICD-10-CM

## 2016-11-11 DIAGNOSIS — K219 Gastro-esophageal reflux disease without esophagitis: Secondary | ICD-10-CM | POA: Insufficient documentation

## 2016-11-11 DIAGNOSIS — K2100 Gastro-esophageal reflux disease with esophagitis, without bleeding: Secondary | ICD-10-CM

## 2016-11-11 MED ORDER — PROMETHAZINE HCL 25 MG PO TABS: 25.0000 mg | ORAL_TABLET | Freq: Four times a day (QID) | ORAL | 2 refills | Status: DC | PRN

## 2016-11-11 MED ORDER — SODIUM CHLORIDE 0.9 % IV BOLUS (SEPSIS): 500.0000 mL | Freq: Once | INTRAVENOUS | Status: DC

## 2016-11-11 NOTE — Patient Instructions (Signed)

## 2016-11-11 NOTE — Assessment & Plan Note (Signed)
Patient was diagnosed with GERD by gastroenterology the last time she had an episode similar to this, will give Phenergan and a GI cocktail and patient is desirous for some IV fluids today.

## 2016-11-11 NOTE — Progress Notes (Signed)
BP (!) 193/101   Pulse 75   Temp 99.5 F (37.5 C) (Oral)   Ht 5\' 6"  (1.676 m)   Wt 176 lb (79.8 kg)   BMI 28.41 kg/m    Subjective:    Patient ID: Theresa Reyes, female    DOB: Sep 30, 1955, 61 y.o.   MRN: [id number]  HPI: Theresa Reyes is a 61 y.o. female presenting on 11/11/2016 for Nausea and vomiting (ongoing since Monday, saw GI doctor and he diagnosed her with "silent GERD"; has frequent episodes)   HPI Nausea and vomiting and GERD Patient has been having nausea and 1 episode of vomiting and epigastric abdominal discomfort that's been going on for the past 3 days this time. She had similar episode back in February and saw gastroenterology and they said it was uncontrolled GERD and put her on Protonix for which she has been doing great on until just recently. She has continued to take Protonix. She says her epigastric discomfort is mild and not as bad as it was the last time but she feels like she started getting dehydrated and would like some IV fluids.  Relevant past medical, surgical, family and social history reviewed and updated as indicated. Interim medical history since our last visit reviewed. Allergies and medications reviewed and updated.  Review of Systems  Constitutional: Positive for fatigue. Negative for chills and fever.  HENT: Negative for congestion, ear discharge and ear pain.   Eyes: Negative for redness and visual disturbance.  Respiratory: Negative for chest tightness and shortness of breath.   Cardiovascular: Negative for chest pain, palpitations and leg swelling.  Gastrointestinal: Positive for abdominal pain, nausea and vomiting. Negative for blood in stool, constipation and diarrhea.  Genitourinary: Negative for difficulty urinating, dysuria, flank pain and frequency.  Musculoskeletal: Negative for back pain and gait problem.  Skin: Negative for rash.  Neurological: Negative for light-headedness and headaches.  Psychiatric/Behavioral: Negative for  agitation and behavioral problems.  All other systems reviewed and are negative.   Per HPI unless specifically indicated above        Objective:    BP (!) 193/101   Pulse 75   Temp 99.5 F (37.5 C) (Oral)   Ht 5\' 6"  (1.676 m)   Wt 176 lb (79.8 kg)   BMI 28.41 kg/m   Wt Readings from Last 3 Encounters:  11/11/16 176 lb (79.8 kg)  09/03/16 181 lb 3.2 oz (82.2 kg)  08/20/16 186 lb (84.4 kg)    Physical Exam  Constitutional: She is oriented to person, place, and time. She appears well-developed and well-nourished. No distress.  Eyes: Conjunctivae are normal.  Cardiovascular: Normal rate, regular rhythm, normal heart sounds and intact distal pulses.   No murmur heard. Pulmonary/Chest: Effort normal and breath sounds normal. No respiratory distress. She has no wheezes. She has no rales.  Abdominal: Soft. Bowel sounds are normal. She exhibits no distension. There is no hepatosplenomegaly. There is tenderness in the epigastric area. There is no rigidity, no rebound, no guarding and no CVA tenderness.  Musculoskeletal: Normal range of motion.  Neurological: She is alert and oriented to person, place, and time. Coordination normal.  Skin: Skin is warm and dry. No rash noted. She is not diaphoretic.  Psychiatric: She has a normal mood and affect. Her behavior is normal.  Nursing note and vitals reviewed.       Assessment & Plan:   Problem List Items Addressed This Visit      Digestive   N&V (nausea and  vomiting) - Primary   Relevant Medications   sodium chloride 0.9 % bolus 500 mL (Start on 11/11/2016  9:30 AM)   promethazine (PHENERGAN) 25 MG tablet   GERD (gastroesophageal reflux disease)    Patient was diagnosed with GERD by gastroenterology the last time she had an episode similar to this, will give Phenergan and a GI cocktail and patient is desirous for some IV fluids today.      Relevant Medications   ondansetron (ZOFRAN-ODT) 4 MG disintegrating tablet   sodium  chloride 0.9 % bolus 500 mL (Start on 11/11/2016  9:30 AM)   promethazine (PHENERGAN) 25 MG tablet   Other Relevant Orders   GI COCKTAIL UP TO 45 CC      Patient received GI cocktail and 500 mL of normal saline, feeling much improved, return if worsens or does not improve.  Follow up plan: Return if symptoms worsen or fail to improve.  Counseling provided for all of the vaccine components Orders Placed This Encounter  Procedures  . GI COCKTAIL UP TO [place], MD [facility] 11/11/2016, 9:25 AM

## 2016-11-13 ENCOUNTER — Encounter: Payer: Self-pay | Admitting: *Deleted

## 2016-11-13 ENCOUNTER — Telehealth: Payer: Self-pay | Admitting: Family Medicine

## 2016-11-13 NOTE — Telephone Encounter (Signed)
Note written and pt aware to pick up

## 2016-11-13 NOTE — Telephone Encounter (Signed)
Yes she can go ahead and have the note and that she can return to work

## 2016-12-07 ENCOUNTER — Other Ambulatory Visit: Payer: Self-pay | Admitting: Nurse Practitioner

## 2016-12-21 ENCOUNTER — Telehealth: Payer: Self-pay | Admitting: Nurse Practitioner

## 2016-12-21 MED ORDER — PANTOPRAZOLE SODIUM 40 MG PO TBEC: 40.0000 mg | DELAYED_RELEASE_TABLET | Freq: Every day | ORAL | 2 refills | Status: DC

## 2016-12-21 NOTE — Telephone Encounter (Signed)
Please review and advise.

## 2016-12-21 NOTE — Telephone Encounter (Signed)
Yes go ahead and send refill

## 2017-02-20 ENCOUNTER — Other Ambulatory Visit: Payer: Self-pay | Admitting: Nurse Practitioner

## 2017-03-23 ENCOUNTER — Other Ambulatory Visit: Payer: Self-pay | Admitting: Family Medicine

## 2017-04-24 ENCOUNTER — Other Ambulatory Visit: Payer: Self-pay | Admitting: Nurse Practitioner

## 2017-05-27 ENCOUNTER — Other Ambulatory Visit: Payer: Self-pay | Admitting: *Deleted

## 2017-05-27 MED ORDER — ESCITALOPRAM OXALATE 20 MG PO TABS: 20.0000 mg | ORAL_TABLET | Freq: Every day | ORAL | 0 refills | Status: DC

## 2017-06-25 ENCOUNTER — Other Ambulatory Visit: Payer: Self-pay | Admitting: Nurse Practitioner

## 2017-07-30 ENCOUNTER — Other Ambulatory Visit: Payer: Self-pay | Admitting: Nurse Practitioner

## 2017-08-30 ENCOUNTER — Other Ambulatory Visit: Payer: Self-pay | Admitting: Nurse Practitioner

## 2017-08-30 NOTE — Telephone Encounter (Signed)
Last refill without being seen 

## 2017-09-28 ENCOUNTER — Other Ambulatory Visit: Payer: Self-pay | Admitting: Nurse Practitioner

## 2017-09-30 ENCOUNTER — Other Ambulatory Visit: Payer: Self-pay | Admitting: Nurse Practitioner

## 2017-10-22 ENCOUNTER — Emergency Department (HOSPITAL_COMMUNITY)
Admission: EM | Admit: 2017-10-22 | Discharge: 2017-10-22 | Disposition: A | Discharge status: Home / Self Care | Payer: Commercial Managed Care - PPO | Attending: Emergency Medicine | Admitting: Emergency Medicine

## 2017-10-22 ENCOUNTER — Encounter (HOSPITAL_COMMUNITY): Payer: Self-pay

## 2017-10-22 DIAGNOSIS — I10 Essential (primary) hypertension: Secondary | ICD-10-CM | POA: Diagnosis not present

## 2017-10-22 DIAGNOSIS — Z79899 Other long term (current) drug therapy: Secondary | ICD-10-CM | POA: Diagnosis not present

## 2017-10-22 DIAGNOSIS — R112 Nausea with vomiting, unspecified: Secondary | ICD-10-CM | POA: Diagnosis not present

## 2017-10-22 DIAGNOSIS — F17210 Nicotine dependence, cigarettes, uncomplicated: Secondary | ICD-10-CM | POA: Diagnosis not present

## 2017-10-22 DIAGNOSIS — R1084 Generalized abdominal pain: Secondary | ICD-10-CM | POA: Diagnosis not present

## 2017-10-22 DIAGNOSIS — R5381 Other malaise: Secondary | ICD-10-CM | POA: Diagnosis not present

## 2017-10-22 LAB — URINALYSIS, ROUTINE W REFLEX MICROSCOPIC
Bacteria, UA: NONE SEEN
Bilirubin Urine: NEGATIVE
Glucose, UA: NEGATIVE mg/dL
Ketones, ur: 20 mg/dL — AB
Leukocytes, UA: NEGATIVE
Nitrite: NEGATIVE
Protein, ur: 30 mg/dL — AB
Specific Gravity, Urine: 1.015 (ref 1.005–1.030)
pH: 7 (ref 5.0–8.0)

## 2017-10-22 LAB — COMPREHENSIVE METABOLIC PANEL
ALT: 16 U/L (ref 14–54)
AST: 19 U/L (ref 15–41)
Albumin: 4.5 g/dL (ref 3.5–5.0)
Alkaline Phosphatase: 58 U/L (ref 38–126)
Anion gap: 12 (ref 5–15)
BUN: 14 mg/dL (ref 6–20)
CO2: 22 mmol/L (ref 22–32)
Calcium: 9.7 mg/dL (ref 8.9–10.3)
Chloride: 103 mmol/L (ref 101–111)
Creatinine, Ser: 0.7 mg/dL (ref 0.44–1.00)
GFR calc Af Amer: >60 mL/min (ref >60)
GFR calc non Af Amer: >60 mL/min (ref >60)
Glucose, Bld: 159 mg/dL — ABNORMAL HIGH (ref 65–99)
Potassium: 3.5 mmol/L (ref 3.5–5.1)
Sodium: 137 mmol/L (ref 135–145)
Total Bilirubin: 0.8 mg/dL (ref 0.3–1.2)
Total Protein: 8.3 g/dL — ABNORMAL HIGH (ref 6.5–8.1)

## 2017-10-22 LAB — LIPASE, BLOOD: Lipase: 21 U/L (ref 11–51)

## 2017-10-22 LAB — CBC
HCT: 43.4 % (ref 36.0–46.0)
Hemoglobin: 14.4 g/dL (ref 12.0–15.0)
MCH: 29.1 pg (ref 26.0–34.0)
MCHC: 33.2 g/dL (ref 30.0–36.0)
MCV: 87.7 fL (ref 78.0–100.0)
Platelets: 289 10*3/uL (ref 150–400)
RBC: 4.95 MIL/uL (ref 3.87–5.11)
RDW: 14.2 % (ref 11.5–15.5)
WBC: 15.7 10*3/uL — ABNORMAL HIGH (ref 4.0–10.5)

## 2017-10-22 LAB — TROPONIN I: Troponin I: <0.03 ng/mL (ref <0.03)

## 2017-10-22 MED ORDER — PROMETHAZINE HCL 25 MG RE SUPP: 25.0000 mg | Freq: Four times a day (QID) | RECTAL | 0 refills | Status: DC | PRN

## 2017-10-22 MED ORDER — LACTATED RINGERS IV BOLUS
1000.0000 mL | Freq: Once | INTRAVENOUS | Status: AC
  Administered 2017-10-22: 1000 mL via INTRAVENOUS

## 2017-10-22 MED ORDER — PROMETHAZINE HCL 25 MG/ML IJ SOLN
12.5000 mg | Freq: Once | INTRAMUSCULAR | Status: AC
  Administered 2017-10-22: 12.5 mg via INTRAVENOUS
  Filled 2017-10-22: qty 1

## 2017-10-22 MED ORDER — ONDANSETRON HCL 4 MG/2ML IJ SOLN
4.0000 mg | Freq: Once | INTRAMUSCULAR | Status: AC
  Administered 2017-10-22: 4 mg via INTRAVENOUS
  Filled 2017-10-22: qty 2

## 2017-10-22 MED ORDER — HALOPERIDOL LACTATE 5 MG/ML IJ SOLN
2.0000 mg | Freq: Once | INTRAMUSCULAR | Status: AC
  Administered 2017-10-22: 2 mg via INTRAVENOUS
  Filled 2017-10-22: qty 1

## 2017-10-22 MED ORDER — ONDANSETRON 4 MG PO TBDP
4.0000 mg | ORAL_TABLET | Freq: Once | ORAL | Status: AC
  Administered 2017-10-22: 4 mg via ORAL
  Filled 2017-10-22: qty 1

## 2017-10-22 NOTE — ED Notes (Signed)
Per pt son,  "Theresa Reyes been eating kind of crazy for the last 2 nights- [employer] one night and smoked sausage the other"  When asked if pt has been observing a GERD diet

## 2017-10-22 NOTE — ED Notes (Signed)
OOB to BR   Family to bedside  Pt reportsthe\at she continues to have vomiting- (none in bag)  When asked regarding Zofran ODT at home, pt reports she has seen a GI in the past with Dx of GERD

## 2017-10-22 NOTE — ED Notes (Signed)
Pt reports less N

## 2017-10-22 NOTE — ED Triage Notes (Signed)
Pt brought in by EMS due to N/V/D. States she woke up at 6 am "sick'. Complaining of pain in right upper quad. Last vomit was en route and loose stools 2 hrs ago

## 2017-10-22 NOTE — ED Notes (Signed)
Family has gone to eat  Pt sleeping

## 2017-10-25 ENCOUNTER — Encounter (HOSPITAL_COMMUNITY): Payer: Self-pay | Admitting: Emergency Medicine

## 2017-10-25 ENCOUNTER — Emergency Department (HOSPITAL_COMMUNITY): Payer: Commercial Managed Care - PPO

## 2017-10-25 ENCOUNTER — Ambulatory Visit: Payer: Self-pay | Admitting: Family Medicine

## 2017-10-25 ENCOUNTER — Observation Stay (HOSPITAL_COMMUNITY)
Admission: EM | Admit: 2017-10-25 | Discharge: 2017-10-27 | Disposition: A | Discharge status: Home / Self Care | Payer: Commercial Managed Care - PPO | Attending: Internal Medicine | Admitting: Internal Medicine

## 2017-10-25 ENCOUNTER — Encounter: Payer: Self-pay | Admitting: Family Medicine

## 2017-10-25 ENCOUNTER — Other Ambulatory Visit: Payer: Self-pay

## 2017-10-25 VITALS — BP 182/101 | HR 71 | Temp 97.7°F | Ht 66.0 in | Wt 176.0 lb

## 2017-10-25 DIAGNOSIS — R079 Chest pain, unspecified: Secondary | ICD-10-CM | POA: Diagnosis not present

## 2017-10-25 DIAGNOSIS — R0789 Other chest pain: Secondary | ICD-10-CM | POA: Diagnosis not present

## 2017-10-25 DIAGNOSIS — R1110 Vomiting, unspecified: Secondary | ICD-10-CM

## 2017-10-25 DIAGNOSIS — K219 Gastro-esophageal reflux disease without esophagitis: Secondary | ICD-10-CM | POA: Diagnosis present

## 2017-10-25 DIAGNOSIS — I160 Hypertensive urgency: Secondary | ICD-10-CM | POA: Diagnosis not present

## 2017-10-25 DIAGNOSIS — R110 Nausea: Secondary | ICD-10-CM | POA: Diagnosis not present

## 2017-10-25 DIAGNOSIS — E860 Dehydration: Secondary | ICD-10-CM | POA: Diagnosis not present

## 2017-10-25 DIAGNOSIS — I10 Essential (primary) hypertension: Secondary | ICD-10-CM | POA: Diagnosis not present

## 2017-10-25 DIAGNOSIS — F17210 Nicotine dependence, cigarettes, uncomplicated: Secondary | ICD-10-CM | POA: Diagnosis not present

## 2017-10-25 DIAGNOSIS — K210 Gastro-esophageal reflux disease with esophagitis: Secondary | ICD-10-CM | POA: Diagnosis not present

## 2017-10-25 DIAGNOSIS — R51 Headache: Secondary | ICD-10-CM | POA: Insufficient documentation

## 2017-10-25 DIAGNOSIS — Z79899 Other long term (current) drug therapy: Secondary | ICD-10-CM | POA: Diagnosis not present

## 2017-10-25 DIAGNOSIS — F32A Depression, unspecified: Secondary | ICD-10-CM | POA: Diagnosis present

## 2017-10-25 DIAGNOSIS — J439 Emphysema, unspecified: Secondary | ICD-10-CM | POA: Diagnosis not present

## 2017-10-25 DIAGNOSIS — R0602 Shortness of breath: Secondary | ICD-10-CM | POA: Diagnosis not present

## 2017-10-25 DIAGNOSIS — E876 Hypokalemia: Secondary | ICD-10-CM | POA: Insufficient documentation

## 2017-10-25 DIAGNOSIS — R112 Nausea with vomiting, unspecified: Secondary | ICD-10-CM

## 2017-10-25 DIAGNOSIS — F329 Major depressive disorder, single episode, unspecified: Secondary | ICD-10-CM | POA: Diagnosis present

## 2017-10-25 LAB — URINALYSIS, ROUTINE W REFLEX MICROSCOPIC
BILIRUBIN URINE: NEGATIVE
Bacteria, UA: NONE SEEN
Glucose, UA: NEGATIVE mg/dL
KETONES UR: NEGATIVE mg/dL
Leukocytes, UA: NEGATIVE
Nitrite: NEGATIVE
Protein, ur: NEGATIVE mg/dL
pH: 7 (ref 5.0–8.0)

## 2017-10-25 LAB — CBC WITH DIFFERENTIAL/PLATELET
BASOS PCT: 0 %
Basophils Absolute: 0 10*3/uL (ref 0.0–0.1)
Eosinophils Absolute: 0 10*3/uL (ref 0.0–0.7)
Eosinophils Relative: 0 %
HEMATOCRIT: 46.5 % — AB (ref 36.0–46.0)
HEMOGLOBIN: 16.1 g/dL — AB (ref 12.0–15.0)
LYMPHS PCT: 12 %
Lymphs Abs: 1.7 10*3/uL (ref 0.7–4.0)
MCH: 29.9 pg (ref 26.0–34.0)
MCHC: 34.6 g/dL (ref 30.0–36.0)
MCV: 86.4 fL (ref 78.0–100.0)
Monocytes Absolute: 0.7 10*3/uL (ref 0.1–1.0)
Monocytes Relative: 5 %
NEUTROS ABS: 11.2 10*3/uL — AB (ref 1.7–7.7)
NEUTROS PCT: 83 %
Platelets: 288 10*3/uL (ref 150–400)
RBC: 5.38 MIL/uL — AB (ref 3.87–5.11)
RDW: 13.9 % (ref 11.5–15.5)
WBC: 13.5 10*3/uL — AB (ref 4.0–10.5)

## 2017-10-25 LAB — BASIC METABOLIC PANEL
Anion gap: 12 (ref 5–15)
BUN: 21 mg/dL — ABNORMAL HIGH (ref 6–20)
CALCIUM: 9.4 mg/dL (ref 8.9–10.3)
CHLORIDE: 97 mmol/L — AB (ref 101–111)
CO2: 25 mmol/L (ref 22–32)
Creatinine, Ser: 1.05 mg/dL — ABNORMAL HIGH (ref 0.44–1.00)
GFR calc non Af Amer: 56 mL/min — ABNORMAL LOW (ref >60)
Glucose, Bld: 124 mg/dL — ABNORMAL HIGH (ref 65–99)
Potassium: 3.2 mmol/L — ABNORMAL LOW (ref 3.5–5.1)
Sodium: 134 mmol/L — ABNORMAL LOW (ref 135–145)

## 2017-10-25 LAB — HEPATIC FUNCTION PANEL
ALK PHOS: 60 U/L (ref 38–126)
ALT: 14 U/L (ref 14–54)
AST: 15 U/L (ref 15–41)
Albumin: 4.3 g/dL (ref 3.5–5.0)
Bilirubin, Direct: 0.1 mg/dL (ref 0.1–0.5)
Indirect Bilirubin: 0.8 mg/dL (ref 0.3–0.9)
TOTAL PROTEIN: 8 g/dL (ref 6.5–8.1)
Total Bilirubin: 0.9 mg/dL (ref 0.3–1.2)

## 2017-10-25 LAB — TROPONIN I
TROPONIN I: 0.03 ng/mL — AB (ref <0.03)
Troponin I: 0.03 ng/mL (ref <0.03)

## 2017-10-25 MED ORDER — ONDANSETRON HCL 4 MG/2ML IJ SOLN
4.0000 mg | Freq: Once | INTRAMUSCULAR | Status: AC
Start: 2017-10-25 — End: 2017-10-25
  Administered 2017-10-25: 4 mg via INTRAVENOUS
  Filled 2017-10-25: qty 2

## 2017-10-25 MED ORDER — HYDRALAZINE HCL 20 MG/ML IJ SOLN
10.0000 mg | Freq: Once | INTRAMUSCULAR | Status: AC
  Administered 2017-10-25: 10 mg via INTRAVENOUS
  Filled 2017-10-25: qty 1

## 2017-10-25 MED ORDER — POTASSIUM CHLORIDE IN NACL 40-0.9 MEQ/L-% IV SOLN
INTRAVENOUS | Status: DC
  Administered 2017-10-25 – 2017-10-26 (×3): 125 mL/h via INTRAVENOUS

## 2017-10-25 MED ORDER — ACETAMINOPHEN 650 MG RE SUPP: 650.0000 mg | Freq: Four times a day (QID) | RECTAL | Status: DC | PRN

## 2017-10-25 MED ORDER — POTASSIUM CHLORIDE 10 MEQ/100ML IV SOLN
10.0000 meq | INTRAVENOUS | Status: AC
  Administered 2017-10-25 (×2): 10 meq via INTRAVENOUS
  Filled 2017-10-25 (×3): qty 100

## 2017-10-25 MED ORDER — LABETALOL HCL 5 MG/ML IV SOLN
10.0000 mg | Freq: Once | INTRAVENOUS | Status: AC
  Administered 2017-10-25: 10 mg via INTRAVENOUS
  Filled 2017-10-25: qty 4

## 2017-10-25 MED ORDER — SODIUM CHLORIDE 0.9 % IV SOLN: Freq: Once | INTRAVENOUS | Status: DC

## 2017-10-25 MED ORDER — ESCITALOPRAM OXALATE 10 MG PO TABS
20.0000 mg | ORAL_TABLET | Freq: Every day | ORAL | Status: DC
  Administered 2017-10-25 – 2017-10-27 (×3): 20 mg via ORAL
  Filled 2017-10-25 (×3): qty 2

## 2017-10-25 MED ORDER — HYDRALAZINE HCL 20 MG/ML IJ SOLN
10.0000 mg | INTRAMUSCULAR | Status: DC | PRN
Start: 2017-10-25 — End: 2017-10-27
  Administered 2017-10-25: 10 mg via INTRAVENOUS
  Filled 2017-10-25: qty 1

## 2017-10-25 MED ORDER — ONDANSETRON HCL 4 MG/2ML IJ SOLN
4.0000 mg | Freq: Four times a day (QID) | INTRAMUSCULAR | Status: DC | PRN
  Administered 2017-10-25 – 2017-10-26 (×3): 4 mg via INTRAVENOUS
  Filled 2017-10-25 (×4): qty 2

## 2017-10-25 MED ORDER — IOPAMIDOL (ISOVUE-370) INJECTION 76%
150.0000 mL | Freq: Once | INTRAVENOUS | Status: AC | PRN
  Administered 2017-10-25: 150 mL via INTRAVENOUS

## 2017-10-25 MED ORDER — PANTOPRAZOLE SODIUM 40 MG PO TBEC
40.0000 mg | DELAYED_RELEASE_TABLET | Freq: Every day | ORAL | Status: DC
  Administered 2017-10-25 – 2017-10-27 (×3): 40 mg via ORAL
  Filled 2017-10-25 (×3): qty 1

## 2017-10-25 MED ORDER — ENOXAPARIN SODIUM 40 MG/0.4ML ~~LOC~~ SOLN
40.0000 mg | SUBCUTANEOUS | Status: DC
  Administered 2017-10-25: 40 mg via SUBCUTANEOUS
  Filled 2017-10-25: qty 0.4

## 2017-10-25 MED ORDER — LORAZEPAM 2 MG/ML IJ SOLN
0.5000 mg | Freq: Once | INTRAMUSCULAR | Status: AC
  Administered 2017-10-25: 0.5 mg via INTRAVENOUS
  Filled 2017-10-25: qty 1

## 2017-10-25 MED ORDER — LISINOPRIL 10 MG PO TABS
10.0000 mg | ORAL_TABLET | Freq: Every day | ORAL | Status: DC
  Administered 2017-10-25 – 2017-10-26 (×2): 10 mg via ORAL
  Filled 2017-10-25 (×2): qty 1

## 2017-10-25 MED ORDER — ACETAMINOPHEN 325 MG PO TABS: 650.0000 mg | ORAL_TABLET | Freq: Four times a day (QID) | ORAL | Status: DC | PRN

## 2017-10-25 NOTE — ED Provider Notes (Addendum)
[facility] EMERGENCY DEPARTMENT Provider Note   CSN: [id number] Arrival date & time: 10/25/17  1016     History   Chief Complaint Chief Complaint  Patient presents with  . Emesis  . Chest Pain    HPI Theresa Reyes is a 62 y.o. female.  HPI Patient presents with 4 days of nausea and vomiting.  Was seen in the emergency department on Friday for the same.  States she was feeling better yesterday and then had a worsening nausea with 4 episodes of vomiting this morning.  She also developed chest pain radiating to her neck and posterior head.  Associated with nausea and dizziness.  Seen by her primary physician today and referred to the emergency department by EMS.  Past Medical History:  Diagnosis Date  . ADD (attention deficit disorder)    adult  . Depression   . Liver mass    S/P resection and partial hepatioma 2017 at [facility]    Patient Active Problem List   Diagnosis Date Noted  . Chest pain 10/25/2017  . GERD (gastroesophageal reflux disease) 11/11/2016  . N&V (nausea and vomiting) 08/13/2016  . Hypokalemia 08/13/2016  . Menopausal sweats 09/05/2015  . Anxiety 08/08/2015  . Current smoker 07/29/2015  . Liver mass 07/11/2015  . Essential hypertension 09/28/2014  . Depression 03/23/2014  . ADHD (attention deficit hyperactivity disorder) 11/15/2012    Past Surgical History:  Procedure Laterality Date  . CHOLECYSTECTOMY    . ECTOPIC PREGNANCY SURGERY    . OPEN PARTIAL HEPATECTOMY   2017     OB History   None      Home Medications    Prior to Admission medications   Medication Sig Start Date End Date Taking? Authorizing Provider  escitalopram (LEXAPRO) 20 MG tablet TAKE 1 TABLET BY MOUTH EVERY DAY 08/30/17  Yes Hassell Done, Mary-Margaret, FNP  ondansetron (ZOFRAN-ODT) 4 MG disintegrating tablet Take 4 mg by mouth every 8 (eight) hours as needed for nausea or vomiting.   Yes [provider]  pantoprazole (PROTONIX) 40 MG tablet Take 1 tablet (40 mg total)  by mouth daily. 12/21/16  Yes Dettinger, Fransisca Kaufmann, MD  promethazine (PHENERGAN) 25 MG suppository Place 1 suppository (25 mg total) rectally every 6 (six) hours as needed for nausea or vomiting. 10/22/17  Yes Virgel Manifold, MD  promethazine (PHENERGAN) 25 MG tablet Take 1 tablet (25 mg total) by mouth every 6 (six) hours as needed for nausea or vomiting. Patient not taking: Reported on 10/25/2017 11/11/16   Dettinger, Fransisca Kaufmann, MD    Family History Family History  Problem Relation Age of Onset  . Hypertension Mother   . Colon cancer Father 79       Deceased age 62 from colon ca    Social History Social History   Tobacco Use  . Smoking status: Current Every Day Smoker    Packs/day: 1.00    Types: Cigarettes  . Smokeless tobacco: Never Used  Substance Use Topics  . Alcohol use: Yes    Comment: Occasional/socially  . Drug use: No     Allergies   Patient has no known allergies.   Review of Systems Review of Systems  Constitutional: Positive for appetite change and fatigue. Negative for chills and fever.  HENT: Negative for congestion, sore throat and trouble swallowing.   Eyes: Negative for visual disturbance.  Respiratory: Negative for cough and shortness of breath.   Cardiovascular: Positive for chest pain. Negative for palpitations and leg swelling.  Gastrointestinal: Positive for  diarrhea, nausea and vomiting. Negative for abdominal pain, blood in stool and constipation.  Genitourinary: Negative for dysuria, flank pain and frequency.  Musculoskeletal: Positive for myalgias and neck pain. Negative for back pain.  Skin: Negative for rash and wound.  Neurological: Positive for dizziness, light-headedness, numbness and headaches. Negative for syncope, speech difficulty and weakness.  All other systems reviewed and are negative.    Physical Exam Updated Vital Signs BP (!) 176/89 (BP Location: Left Arm)   Pulse 73   Temp 98.9 F (37.2 C) (Oral)   Resp 18   Ht 5\' 6"   (1.676 m)   Wt 79.8 kg (176 lb)   SpO2 99%   BMI 28.41 kg/m   Physical Exam  Constitutional: She is oriented to person, place, and time. She appears well-developed and well-nourished.  Tired appearing  HENT:  Head: Normocephalic and atraumatic.  Mouth/Throat: Oropharynx is clear and moist.  Eyes: Pupils are equal, round, and reactive to light. EOM are normal.  Horizontal nystagmus  Neck: Normal range of motion. Neck supple.  Patient does have some discomfort with range of motion of her neck.  No definite meningeal signs.  Cardiovascular: Normal rate and regular rhythm. Exam reveals no gallop and no friction rub.  No murmur heard. Pulmonary/Chest: Effort normal. She has rales.  Few scattered rales in bilateral bases.  Abdominal: Soft. Bowel sounds are normal. There is no tenderness. There is no rebound and no guarding.  Musculoskeletal: Normal range of motion. She exhibits no edema or tenderness.  No lower extremity swelling, asymmetry or tenderness.  Distal pulses are 3+ in all extremities.  Lymphadenopathy:    She has no cervical adenopathy.  Neurological: She is alert and oriented to person, place, and time.  Patient is alert and oriented x3 with clear, goal oriented speech. Patient has 5/5 motor in all extremities. Sensation is intact to light touch. Bilateral finger-to-nose is normal with no signs of dysmetria.   Skin: Skin is warm and dry. Capillary refill takes less than 2 seconds. No rash noted. No erythema.  Psychiatric: She has a normal mood and affect. Her behavior is normal.  Nursing note and vitals reviewed.    ED Treatments / Results  Labs (all labs ordered are listed, but only abnormal results are displayed) Labs Reviewed  CBC WITH DIFFERENTIAL/PLATELET - Abnormal; Notable for the following components:      Result Value   WBC 13.5 (*)    RBC 5.38 (*)    Hemoglobin 16.1 (*)    HCT 46.5 (*)    Neutro Abs 11.2 (*)    All other components within normal limits    BASIC METABOLIC PANEL - Abnormal; Notable for the following components:   Sodium 134 (*)    Potassium 3.2 (*)    Chloride 97 (*)    Glucose, Bld 124 (*)    BUN 21 (*)    Creatinine, Ser 1.05 (*)    GFR calc non Af Amer 56 (*)    All other components within normal limits  TROPONIN I - Abnormal; Notable for the following components:   Troponin I 0.03 (*)    All other components within normal limits  HEPATIC FUNCTION PANEL  URINALYSIS, ROUTINE W REFLEX MICROSCOPIC    EKG EKG Interpretation  Date/Time:  Monday October 25 2017 10:23:21 EDT Ventricular Rate:  57 PR Interval:    QRS Duration: 95 QT Interval:  481 QTC Calculation: 469 R Axis:   62 Text Interpretation:  Sinus rhythm Probable left  atrial enlargement RSR' in V1 or V2, right VCD or RVH Nonspecific T abnormalities, lateral leads Confirmed by Julianne Rice [phone]) on 10/25/2017 10:26:43 AM   Radiology Ct Angio Head W Or Wo Contrast  Result Date: 10/25/2017 CLINICAL DATA:  Headache, acute, normal neuro exam. Four-day history of chest pain. Hypertensive urgency. EXAM: CT ANGIOGRAPHY HEAD AND NECK TECHNIQUE: Multidetector CT imaging of the head and neck was performed using the standard protocol during bolus administration of intravenous contrast. Multiplanar CT image reconstructions and MIPs were obtained to evaluate the vascular anatomy. Carotid stenosis measurements (when applicable) are obtained utilizing NASCET criteria, using the distal internal carotid diameter as the denominator. CONTRAST:  120mL ISOVUE-370 IOPAMIDOL (ISOVUE-370) INJECTION 76% COMPARISON:  None. FINDINGS: CT HEAD FINDINGS Brain: Noncontrast imaging the brain is unremarkable. No acute infarct, hemorrhage, or mass lesion is present. The ventricles are of normal size. No significant white matter disease is present. The brainstem and cerebellum are normal. No significant extra-axial fluid collection is present. Vascular: Faint atherosclerotic calcifications are  present within the cavernous internal carotid arteries. There is no hyperdense vessel. Skull: Calvarium is intact. No focal lytic or blastic lesions are present. No significant extracranial soft tissue injury is present. Sinuses: The paranasal sinuses and mastoid air cells are clear. Orbits: Globes and orbits are within normal limits. Review of the MIP images confirms the above findings CTA NECK FINDINGS Aortic arch: A 3 vessel arch configuration is present. No significant atherosclerotic changes are present. There is no significant stenosis at the origins of the great vessels. Right carotid system: Mild proximal tortuosity is present in the cervical right ICA. The right carotid bifurcation demonstrates minimal atherosclerotic change without significant luminal stenosis. Mild tortuosity is present in the cervical right ICA. A focal beaded appearance in the mid cervical right ICA suggests fibromuscular dysplasia. Left carotid system: The left common carotid artery is within normal limits. Bifurcation is unremarkable. There is some tortuosity of the cervical left ICA without significant stenosis. Minimal irregularity is present on the left, less so than on the right. Vertebral arteries: The vertebral arteries originate from the subclavian arteries bilaterally. There is no significant stenosis at the vertebral artery origins. Atherosclerotic calcifications are present along the wall of the right vertebral artery. There is tortuosity of the left vertebral artery particular. Vertebral arteries are otherwise codominant. No focal stenosis or vascular injury is evident in the neck. Skeleton: Degenerative anterolisthesis present at C4-5 and C6-7. Other neck: Bilateral subcentimeter lymph nodes are present. No significant cervical adenopathy is present. Salivary glands are within normal limits. Vocal cords are midline and symmetric. The thyroid is within normal limits. Upper chest: Centrilobular emphysematous changes are  present at the lung apices. No focal nodule, mass, or airspace disease is present. Review of the MIP images confirms the above findings CTA HEAD FINDINGS Anterior circulation: Atherosclerotic calcifications are present within the cavernous internal carotid arteries bilaterally. There is no significant luminal stenosis through the ICA terminus. A1 and M1 segments are normal. The anterior communicating artery is patent. MCA bifurcations are intact. There is moderate attenuation of MCA branch vessels without a significant proximal stenosis or occlusion. ACA branch vessels are within normal limits. Posterior circulation: The left vertebral artery is slightly dominant to the right. PICA origins are visualized and normal. The basilar artery is normal. Both posterior cerebral arteries originate from the basilar tip. There is moderate attenuation of distal PCA branch vessels without a significant proximal stenosis or occlusion. No aneurysms are present. Venous sinuses: The dural  sinuses are patent. The straight sinus and deep cerebral veins are intact. Cortical veins are unremarkable. Anatomic variants: None Delayed phase: Postcontrast images demonstrate no pathologic enhancement. Review of the MIP images confirms the above findings IMPRESSION: 1. Atherosclerotic calcifications involving the cavernous internal carotid arteries bilaterally without focal stenosis. 2. Focal vessel irregularity in the mid cervical internal carotid arteries, right greater than left raises concern for fibromuscular dysplasia. 3. No other significant proximal stenosis, aneurysm, or branch vessel occlusion. 4. Mild degenerative changes of the cervical spine as described. 5.  Emphysema (ICD10-J43.9). Electronically Signed   By: San Morelle M.D.   On: 10/25/2017 14:16   Dg Chest 2 View  Result Date: 10/25/2017 CLINICAL DATA:  Vomiting for the past 3 days. Onset of chest pain and shortness of breath today. Also headache. Current smoker  EXAM: CHEST - 2 VIEW COMPARISON:  PA and lateral chest x-ray of August 13, 2016 FINDINGS: The lungs are well-expanded and clear. The heart and pulmonary vascularity are normal. The mediastinum is normal in width. There is faint calcification in the wall of the aortic arch. There is no pleural effusion. The bony thorax exhibits no acute abnormality. IMPRESSION: There is no pneumonia nor other acute cardiopulmonary abnormality. Thoracic aortic atherosclerosis. Electronically Signed   By: Jade Burright  [place] M.D.   On: 10/25/2017 11:46   Ct Angio Neck W And/or Wo Contrast  Result Date: 10/25/2017 CLINICAL DATA:  Headache, acute, normal neuro exam. Four-day history of chest pain. Hypertensive urgency. EXAM: CT ANGIOGRAPHY HEAD AND NECK TECHNIQUE: Multidetector CT imaging of the head and neck was performed using the standard protocol during bolus administration of intravenous contrast. Multiplanar CT image reconstructions and MIPs were obtained to evaluate the vascular anatomy. Carotid stenosis measurements (when applicable) are obtained utilizing NASCET criteria, using the distal internal carotid diameter as the denominator. CONTRAST:  170mL ISOVUE-370 IOPAMIDOL (ISOVUE-370) INJECTION 76% COMPARISON:  None. FINDINGS: CT HEAD FINDINGS Brain: Noncontrast imaging the brain is unremarkable. No acute infarct, hemorrhage, or mass lesion is present. The ventricles are of normal size. No significant white matter disease is present. The brainstem and cerebellum are normal. No significant extra-axial fluid collection is present. Vascular: Faint atherosclerotic calcifications are present within the cavernous internal carotid arteries. There is no hyperdense vessel. Skull: Calvarium is intact. No focal lytic or blastic lesions are present. No significant extracranial soft tissue injury is present. Sinuses: The paranasal sinuses and mastoid air cells are clear. Orbits: Globes and orbits are within normal limits. Review of the MIP images  confirms the above findings CTA NECK FINDINGS Aortic arch: A 3 vessel arch configuration is present. No significant atherosclerotic changes are present. There is no significant stenosis at the origins of the great vessels. Right carotid system: Mild proximal tortuosity is present in the cervical right ICA. The right carotid bifurcation demonstrates minimal atherosclerotic change without significant luminal stenosis. Mild tortuosity is present in the cervical right ICA. A focal beaded appearance in the mid cervical right ICA suggests fibromuscular dysplasia. Left carotid system: The left common carotid artery is within normal limits. Bifurcation is unremarkable. There is some tortuosity of the cervical left ICA without significant stenosis. Minimal irregularity is present on the left, less so than on the right. Vertebral arteries: The vertebral arteries originate from the subclavian arteries bilaterally. There is no significant stenosis at the vertebral artery origins. Atherosclerotic calcifications are present along the wall of the right vertebral artery. There is tortuosity of the left vertebral artery particular. Vertebral arteries are otherwise  codominant. No focal stenosis or vascular injury is evident in the neck. Skeleton: Degenerative anterolisthesis present at C4-5 and C6-7. Other neck: Bilateral subcentimeter lymph nodes are present. No significant cervical adenopathy is present. Salivary glands are within normal limits. Vocal cords are midline and symmetric. The thyroid is within normal limits. Upper chest: Centrilobular emphysematous changes are present at the lung apices. No focal nodule, mass, or airspace disease is present. Review of the MIP images confirms the above findings CTA HEAD FINDINGS Anterior circulation: Atherosclerotic calcifications are present within the cavernous internal carotid arteries bilaterally. There is no significant luminal stenosis through the ICA terminus. A1 and M1 segments  are normal. The anterior communicating artery is patent. MCA bifurcations are intact. There is moderate attenuation of MCA branch vessels without a significant proximal stenosis or occlusion. ACA branch vessels are within normal limits. Posterior circulation: The left vertebral artery is slightly dominant to the right. PICA origins are visualized and normal. The basilar artery is normal. Both posterior cerebral arteries originate from the basilar tip. There is moderate attenuation of distal PCA branch vessels without a significant proximal stenosis or occlusion. No aneurysms are present. Venous sinuses: The dural sinuses are patent. The straight sinus and deep cerebral veins are intact. Cortical veins are unremarkable. Anatomic variants: None Delayed phase: Postcontrast images demonstrate no pathologic enhancement. Review of the MIP images confirms the above findings IMPRESSION: 1. Atherosclerotic calcifications involving the cavernous internal carotid arteries bilaterally without focal stenosis. 2. Focal vessel irregularity in the mid cervical internal carotid arteries, right greater than left raises concern for fibromuscular dysplasia. 3. No other significant proximal stenosis, aneurysm, or branch vessel occlusion. 4. Mild degenerative changes of the cervical spine as described. 5.  Emphysema (ICD10-J43.9). Electronically Signed   By: San Morelle M.D.   On: 10/25/2017 14:16   Ct Angio Chest Aorta W And/or Wo Contrast  Result Date: 10/25/2017 CLINICAL DATA:  Chest pain, hypertension EXAM: CT ANGIOGRAPHY CHEST WITH CONTRAST TECHNIQUE: Multidetector CT imaging of the chest was performed using the standard protocol during bolus administration of intravenous contrast. Multiplanar CT image reconstructions and MIPs were obtained to evaluate the vascular anatomy. CONTRAST:  170mL ISOVUE-370 IOPAMIDOL (ISOVUE-370) INJECTION 76% COMPARISON:  None. FINDINGS: Cardiovascular: No evidence of aortic aneurysm or  dissection. Heart is normal size. Mediastinum/Nodes: No mediastinal, hilar, or axillary adenopathy. Lungs/Pleura: Lungs are clear. No focal airspace opacities or suspicious nodules. No effusions. Upper Abdomen: Imaging into the upper abdomen shows no acute findings. Fatty infiltration of the liver. Prior cholecystectomy. Musculoskeletal: Chest wall soft tissues are unremarkable. No acute bony abnormality. Review of the MIP images confirms the above findings. IMPRESSION: No evidence of aortic aneurysm or dissection. No acute cardiopulmonary disease. Fatty infiltration of the liver. Electronically Signed   By: Rolm Baptise M.D.   On: 10/25/2017 14:04    Procedures Procedures (including critical care time)  Medications Ordered in ED Medications  potassium chloride 10 mEq in 100 mL IVPB (10 mEq Intravenous New Bag/Given 10/25/17 1506)  labetalol (NORMODYNE,TRANDATE) injection 10 mg (10 mg Intravenous Given 10/25/17 1143)  LORazepam (ATIVAN) injection 0.5 mg (0.5 mg Intravenous Given 10/25/17 1141)  ondansetron (ZOFRAN) injection 4 mg (4 mg Intravenous Given 10/25/17 1139)  hydrALAZINE (APRESOLINE) injection 10 mg (10 mg Intravenous Given 10/25/17 1405)  iopamidol (ISOVUE-370) 76 % injection 150 mL (150 mLs Intravenous Contrast Given 10/25/17 1328)  hydrALAZINE (APRESOLINE) injection 10 mg (10 mg Intravenous Given 10/25/17 1439)   CRITICAL CARE Performed by: Julianne Rice Total critical care  time: 35 minutes Critical care time was exclusive of separately billable procedures and treating other patients. Critical care was necessary to treat or prevent imminent or life-threatening deterioration. Critical care was time spent personally by me on the following activities: development of treatment plan with patient and/or surrogate as well as nursing, discussions with consultants, evaluation of patient's response to treatment, examination of patient, obtaining history from patient or surrogate, ordering and performing  treatments and interventions, ordering and review of laboratory studies, ordering and review of radiographic studies, pulse oximetry and re-evaluation of patient's condition.  Initial Impression / Assessment and Plan / ED Course  I have reviewed the triage vital signs and the nursing notes.  Pertinent labs & imaging results that were available during my care of the patient were reviewed by me and considered in my medical decision making (see chart for details).     Patient's dizziness and nausea is improved after medication.  Given multiple doses of IV medication to control blood pressure.  CT angios chest, neck and head without evidence of dissection.  Mild hypokalemia which will be replaced in the emergency department.  Chest pain is improved.  Atypical in nature.  Mild elevation of troponin at 0.03.  Discussed with hospitalist will see patient in the emergency department  Final Clinical Impressions(s) / ED Diagnoses   Final diagnoses:  Hypertensive urgency  Atypical chest pain  Non-intractable vomiting, presence of nausea not specified, unspecified vomiting type  Hypokalemia    ED Discharge Orders    None       Julianne Rice, MD 10/25/17 1508    Julianne Rice, MD 11/20/17 1622

## 2017-10-25 NOTE — ED Notes (Signed)
CRITICAL VALUE ALERT  Critical Value:  Troponin 0.03  Date & Time Notied:  10/25/2017, 1141  Provider Notified: Dr. Lita Mains  Orders Received/Actions taken: see chart

## 2017-10-25 NOTE — H&P (Signed)
History and Physical    Theresa Reyes WUJ:[id number] DOB: 06/16/1955 DOA: 10/25/2017  PCP: Chevis Pretty, FNP  Patient coming from: Home  I have personally briefly reviewed patient's old medical records in [place]  Chief Complaint: Nausea, vomiting, headache  HPI: Theresa Reyes is a 62 y.o. female with medical history significant of depression, prior cholecystectomy.  Periodic nausea and vomiting which is mostly related to p.o. intake of greasy food, elevated blood pressure.  Patient reports that over the last few days she has had persistent nausea, and vomiting after she ate fried sausage.  She denies any fever.  No diarrhea.  She does not have any abdominal pain, but does describe soreness from repetitive vomiting.  She is been feeling increasingly weak and tired as well as dehydrated.  She notes that when she has these episodes of vomiting, her blood pressure does elevate.  She developed pain in the back of her neck, back of her head and radiating around to her forehead this morning.  She is also had chest discomfort which she attributes to repetitive vomiting.  ED Course: She was noted to be markedly hypertensive with a systolic blood pressure greater than 200.  CT scan of the chest, neck and head were negative for any dissection.  Labs indicate mild elevation of creatinine compared to prior level as well as mild hypokalemia.  She is been referred for admission  Review of Systems: As per HPI otherwise 10 point review of systems negative.    Past Medical History:  Diagnosis Date  . ADD (attention deficit disorder)    adult  . Depression   . Liver mass    S/P resection and partial hepatioma 2017 at [facility]    Past Surgical History:  Procedure Laterality Date  . CHOLECYSTECTOMY    . ECTOPIC PREGNANCY SURGERY    . OPEN PARTIAL HEPATECTOMY   2017     reports that she has been smoking cigarettes.  She has been smoking about 1.00 pack per day. She has never used  smokeless tobacco. She reports that she drinks alcohol. She reports that she does not use drugs.  No Known Allergies  Family History  Problem Relation Age of Onset  . Hypertension Mother   . Colon cancer Father 43       Deceased age 48 from colon ca    Prior to Admission medications   Medication Sig Start Date End Date Taking? Authorizing Provider  escitalopram (LEXAPRO) 20 MG tablet TAKE 1 TABLET BY MOUTH EVERY DAY 08/30/17  Yes Hassell Done, Mary-Margaret, FNP  ondansetron (ZOFRAN-ODT) 4 MG disintegrating tablet Take 4 mg by mouth every 8 (eight) hours as needed for nausea or vomiting.   Yes [provider]  pantoprazole (PROTONIX) 40 MG tablet Take 1 tablet (40 mg total) by mouth daily. 12/21/16  Yes Dettinger, Fransisca Kaufmann, MD  promethazine (PHENERGAN) 25 MG suppository Place 1 suppository (25 mg total) rectally every 6 (six) hours as needed for nausea or vomiting. 10/22/17  Yes Virgel Manifold, MD  promethazine (PHENERGAN) 25 MG tablet Take 1 tablet (25 mg total) by mouth every 6 (six) hours as needed for nausea or vomiting. Patient not taking: Reported on 10/25/2017 11/11/16   Dettinger, Fransisca Kaufmann, MD    Physical Exam: Vitals:   10/25/17 1515 10/25/17 1530 10/25/17 1600 10/25/17 1649  BP:  (!) 170/83 (!) 171/89 (!) 177/83  Pulse: 76 74 77 69  Resp: 10 17 (!) 21 20  Temp:    99.4 F (37.4  C)  TempSrc:    Oral  SpO2: 99% 98% 96% 99%  Weight:      Height:        Constitutional: NAD, calm, comfortable Vitals:   10/25/17 1515 10/25/17 1530 10/25/17 1600 10/25/17 1649  BP:  (!) 170/83 (!) 171/89 (!) 177/83  Pulse: 76 74 77 69  Resp: 10 17 (!) 21 20  Temp:    99.4 F (37.4 C)  TempSrc:    Oral  SpO2: 99% 98% 96% 99%  Weight:      Height:       Eyes: PERRL, lids and conjunctivae normal ENMT: Mucous membranes are moist. Posterior pharynx clear of any exudate or lesions.Normal dentition.  Neck: normal, supple, no masses, no thyromegaly Respiratory: clear to auscultation  bilaterally, no wheezing, no crackles. Normal respiratory effort. No accessory muscle use.  Cardiovascular: Regular rate and rhythm, no murmurs / rubs / gallops. No extremity edema. 2+ pedal pulses. No carotid bruits.  Abdomen: no tenderness, no masses palpated. No hepatosplenomegaly. Bowel sounds positive.  Musculoskeletal: no clubbing / cyanosis. No joint deformity upper and lower extremities. Good ROM, no contractures. Normal muscle tone.  Skin: no rashes, lesions, ulcers. No induration Neurologic: CN 2-12 grossly intact. Sensation intact, DTR normal. Strength 5/5 in all 4.  Psychiatric: Normal judgment and insight. Alert and oriented x 3. Normal mood.    Labs on Admission: I have personally reviewed following labs and imaging studies  CBC: Recent Labs  Lab 10/22/17 1714 10/25/17 1050  WBC 15.7* 13.5*  NEUTROABS  --  11.2*  HGB 14.4 16.1*  HCT 43.4 46.5*  MCV 87.7 86.4  PLT 289 762   Basic Metabolic Panel: Recent Labs  Lab 10/22/17 1714 10/25/17 1050  NA 137 134*  K 3.5 3.2*  CL 103 97*  CO2 22 25  GLUCOSE 159* 124*  BUN 14 21*  CREATININE 0.70 1.05*  CALCIUM 9.7 9.4   GFR: Estimated Creatinine Clearance: 60 mL/min (A) (by C-G formula based on SCr of 1.05 mg/dL (H)). Liver Function Tests: Recent Labs  Lab 10/22/17 1714 10/25/17 1050  AST 19 15  ALT 16 14  ALKPHOS 58 60  BILITOT 0.8 0.9  PROT 8.3* 8.0  ALBUMIN 4.5 4.3   Recent Labs  Lab 10/22/17 1714  LIPASE 21   No results for input(s): AMMONIA in the last 168 hours. Coagulation Profile: No results for input(s): INR, PROTIME in the last 168 hours. Cardiac Enzymes: Recent Labs  Lab 10/22/17 1714 10/25/17 1050  TROPONINI <0.03 0.03*   BNP (last 3 results) No results for input(s): PROBNP in the last 8760 hours. HbA1C: No results for input(s): HGBA1C in the last 72 hours. CBG: No results for input(s): GLUCAP in the last 168 hours. Lipid Profile: No results for input(s): CHOL, HDL, LDLCALC, TRIG,  CHOLHDL, LDLDIRECT in the last 72 hours. Thyroid Function Tests: No results for input(s): TSH, T4TOTAL, FREET4, T3FREE, THYROIDAB in the last 72 hours. Anemia Panel: No results for input(s): VITAMINB12, FOLATE, FERRITIN, TIBC, IRON, RETICCTPCT in the last 72 hours. Urine analysis:    Component Value Date/Time   COLORURINE COLORLESS (A) 10/25/2017 1520   APPEARANCEUR CLEAR 10/25/2017 1520   APPEARANCEUR Clear 08/19/2015 1436   LABSPEC >1.046 (H) 10/25/2017 1520   PHURINE 7.0 10/25/2017 1520   GLUCOSEU NEGATIVE 10/25/2017 1520   HGBUR SMALL (A) 10/25/2017 1520   BILIRUBINUR NEGATIVE 10/25/2017 1520   BILIRUBINUR Negative 08/19/2015 1436   KETONESUR NEGATIVE 10/25/2017 1520   PROTEINUR NEGATIVE 10/25/2017 1520  UROBILINOGEN 0.2 07/04/2013 1125   NITRITE NEGATIVE 10/25/2017 1520   LEUKOCYTESUR NEGATIVE 10/25/2017 1520   LEUKOCYTESUR 6-10 (A) 08/19/2015 1436    Radiological Exams on Admission: Ct Angio Head W Or Wo Contrast  Result Date: 10/25/2017 CLINICAL DATA:  Headache, acute, normal neuro exam. Four-day history of chest pain. Hypertensive urgency. EXAM: CT ANGIOGRAPHY HEAD AND NECK TECHNIQUE: Multidetector CT imaging of the head and neck was performed using the standard protocol during bolus administration of intravenous contrast. Multiplanar CT image reconstructions and MIPs were obtained to evaluate the vascular anatomy. Carotid stenosis measurements (when applicable) are obtained utilizing NASCET criteria, using the distal internal carotid diameter as the denominator. CONTRAST:  124mL ISOVUE-370 IOPAMIDOL (ISOVUE-370) INJECTION 76% COMPARISON:  None. FINDINGS: CT HEAD FINDINGS Brain: Noncontrast imaging the brain is unremarkable. No acute infarct, hemorrhage, or mass lesion is present. The ventricles are of normal size. No significant white matter disease is present. The brainstem and cerebellum are normal. No significant extra-axial fluid collection is present. Vascular: Faint  atherosclerotic calcifications are present within the cavernous internal carotid arteries. There is no hyperdense vessel. Skull: Calvarium is intact. No focal lytic or blastic lesions are present. No significant extracranial soft tissue injury is present. Sinuses: The paranasal sinuses and mastoid air cells are clear. Orbits: Globes and orbits are within normal limits. Review of the MIP images confirms the above findings CTA NECK FINDINGS Aortic arch: A 3 vessel arch configuration is present. No significant atherosclerotic changes are present. There is no significant stenosis at the origins of the great vessels. Right carotid system: Mild proximal tortuosity is present in the cervical right ICA. The right carotid bifurcation demonstrates minimal atherosclerotic change without significant luminal stenosis. Mild tortuosity is present in the cervical right ICA. A focal beaded appearance in the mid cervical right ICA suggests fibromuscular dysplasia. Left carotid system: The left common carotid artery is within normal limits. Bifurcation is unremarkable. There is some tortuosity of the cervical left ICA without significant stenosis. Minimal irregularity is present on the left, less so than on the right. Vertebral arteries: The vertebral arteries originate from the subclavian arteries bilaterally. There is no significant stenosis at the vertebral artery origins. Atherosclerotic calcifications are present along the wall of the right vertebral artery. There is tortuosity of the left vertebral artery particular. Vertebral arteries are otherwise codominant. No focal stenosis or vascular injury is evident in the neck. Skeleton: Degenerative anterolisthesis present at C4-5 and C6-7. Other neck: Bilateral subcentimeter lymph nodes are present. No significant cervical adenopathy is present. Salivary glands are within normal limits. Vocal cords are midline and symmetric. The thyroid is within normal limits. Upper chest:  Centrilobular emphysematous changes are present at the lung apices. No focal nodule, mass, or airspace disease is present. Review of the MIP images confirms the above findings CTA HEAD FINDINGS Anterior circulation: Atherosclerotic calcifications are present within the cavernous internal carotid arteries bilaterally. There is no significant luminal stenosis through the ICA terminus. A1 and M1 segments are normal. The anterior communicating artery is patent. MCA bifurcations are intact. There is moderate attenuation of MCA branch vessels without a significant proximal stenosis or occlusion. ACA branch vessels are within normal limits. Posterior circulation: The left vertebral artery is slightly dominant to the right. PICA origins are visualized and normal. The basilar artery is normal. Both posterior cerebral arteries originate from the basilar tip. There is moderate attenuation of distal PCA branch vessels without a significant proximal stenosis or occlusion. No aneurysms are present. Venous sinuses:  The dural sinuses are patent. The straight sinus and deep cerebral veins are intact. Cortical veins are unremarkable. Anatomic variants: None Delayed phase: Postcontrast images demonstrate no pathologic enhancement. Review of the MIP images confirms the above findings IMPRESSION: 1. Atherosclerotic calcifications involving the cavernous internal carotid arteries bilaterally without focal stenosis. 2. Focal vessel irregularity in the mid cervical internal carotid arteries, right greater than left raises concern for fibromuscular dysplasia. 3. No other significant proximal stenosis, aneurysm, or branch vessel occlusion. 4. Mild degenerative changes of the cervical spine as described. 5.  Emphysema (ICD10-J43.9). Electronically Signed   By: San Morelle M.D.   On: 10/25/2017 14:16   Dg Chest 2 View  Result Date: 10/25/2017 CLINICAL DATA:  Vomiting for the past 3 days. Onset of chest pain and shortness of breath  today. Also headache. Current smoker EXAM: CHEST - 2 VIEW COMPARISON:  PA and lateral chest x-ray of August 13, 2016 FINDINGS: The lungs are well-expanded and clear. The heart and pulmonary vascularity are normal. The mediastinum is normal in width. There is faint calcification in the wall of the aortic arch. There is no pleural effusion. The bony thorax exhibits no acute abnormality. IMPRESSION: There is no pneumonia nor other acute cardiopulmonary abnormality. Thoracic aortic atherosclerosis. Electronically Signed   By: David  Martinique M.D.   On: 10/25/2017 11:46   Ct Angio Neck W And/or Wo Contrast  Result Date: 10/25/2017 CLINICAL DATA:  Headache, acute, normal neuro exam. Four-day history of chest pain. Hypertensive urgency. EXAM: CT ANGIOGRAPHY HEAD AND NECK TECHNIQUE: Multidetector CT imaging of the head and neck was performed using the standard protocol during bolus administration of intravenous contrast. Multiplanar CT image reconstructions and MIPs were obtained to evaluate the vascular anatomy. Carotid stenosis measurements (when applicable) are obtained utilizing NASCET criteria, using the distal internal carotid diameter as the denominator. CONTRAST:  120mL ISOVUE-370 IOPAMIDOL (ISOVUE-370) INJECTION 76% COMPARISON:  None. FINDINGS: CT HEAD FINDINGS Brain: Noncontrast imaging the brain is unremarkable. No acute infarct, hemorrhage, or mass lesion is present. The ventricles are of normal size. No significant white matter disease is present. The brainstem and cerebellum are normal. No significant extra-axial fluid collection is present. Vascular: Faint atherosclerotic calcifications are present within the cavernous internal carotid arteries. There is no hyperdense vessel. Skull: Calvarium is intact. No focal lytic or blastic lesions are present. No significant extracranial soft tissue injury is present. Sinuses: The paranasal sinuses and mastoid air cells are clear. Orbits: Globes and orbits are within  normal limits. Review of the MIP images confirms the above findings CTA NECK FINDINGS Aortic arch: A 3 vessel arch configuration is present. No significant atherosclerotic changes are present. There is no significant stenosis at the origins of the great vessels. Right carotid system: Mild proximal tortuosity is present in the cervical right ICA. The right carotid bifurcation demonstrates minimal atherosclerotic change without significant luminal stenosis. Mild tortuosity is present in the cervical right ICA. A focal beaded appearance in the mid cervical right ICA suggests fibromuscular dysplasia. Left carotid system: The left common carotid artery is within normal limits. Bifurcation is unremarkable. There is some tortuosity of the cervical left ICA without significant stenosis. Minimal irregularity is present on the left, less so than on the right. Vertebral arteries: The vertebral arteries originate from the subclavian arteries bilaterally. There is no significant stenosis at the vertebral artery origins. Atherosclerotic calcifications are present along the wall of the right vertebral artery. There is tortuosity of the left vertebral artery particular. Vertebral arteries  are otherwise codominant. No focal stenosis or vascular injury is evident in the neck. Skeleton: Degenerative anterolisthesis present at C4-5 and C6-7. Other neck: Bilateral subcentimeter lymph nodes are present. No significant cervical adenopathy is present. Salivary glands are within normal limits. Vocal cords are midline and symmetric. The thyroid is within normal limits. Upper chest: Centrilobular emphysematous changes are present at the lung apices. No focal nodule, mass, or airspace disease is present. Review of the MIP images confirms the above findings CTA HEAD FINDINGS Anterior circulation: Atherosclerotic calcifications are present within the cavernous internal carotid arteries bilaterally. There is no significant luminal stenosis  through the ICA terminus. A1 and M1 segments are normal. The anterior communicating artery is patent. MCA bifurcations are intact. There is moderate attenuation of MCA branch vessels without a significant proximal stenosis or occlusion. ACA branch vessels are within normal limits. Posterior circulation: The left vertebral artery is slightly dominant to the right. PICA origins are visualized and normal. The basilar artery is normal. Both posterior cerebral arteries originate from the basilar tip. There is moderate attenuation of distal PCA branch vessels without a significant proximal stenosis or occlusion. No aneurysms are present. Venous sinuses: The dural sinuses are patent. The straight sinus and deep cerebral veins are intact. Cortical veins are unremarkable. Anatomic variants: None Delayed phase: Postcontrast images demonstrate no pathologic enhancement. Review of the MIP images confirms the above findings IMPRESSION: 1. Atherosclerotic calcifications involving the cavernous internal carotid arteries bilaterally without focal stenosis. 2. Focal vessel irregularity in the mid cervical internal carotid arteries, right greater than left raises concern for fibromuscular dysplasia. 3. No other significant proximal stenosis, aneurysm, or branch vessel occlusion. 4. Mild degenerative changes of the cervical spine as described. 5.  Emphysema (ICD10-J43.9). Electronically Signed   By: San Morelle M.D.   On: 10/25/2017 14:16   Ct Angio Chest Aorta W And/or Wo Contrast  Result Date: 10/25/2017 CLINICAL DATA:  Chest pain, hypertension EXAM: CT ANGIOGRAPHY CHEST WITH CONTRAST TECHNIQUE: Multidetector CT imaging of the chest was performed using the standard protocol during bolus administration of intravenous contrast. Multiplanar CT image reconstructions and MIPs were obtained to evaluate the vascular anatomy. CONTRAST:  129mL ISOVUE-370 IOPAMIDOL (ISOVUE-370) INJECTION 76% COMPARISON:  None. FINDINGS:  Cardiovascular: No evidence of aortic aneurysm or dissection. Heart is normal size. Mediastinum/Nodes: No mediastinal, hilar, or axillary adenopathy. Lungs/Pleura: Lungs are clear. No focal airspace opacities or suspicious nodules. No effusions. Upper Abdomen: Imaging into the upper abdomen shows no acute findings. Fatty infiltration of the liver. Prior cholecystectomy. Musculoskeletal: Chest wall soft tissues are unremarkable. No acute bony abnormality. Review of the MIP images confirms the above findings. IMPRESSION: No evidence of aortic aneurysm or dissection. No acute cardiopulmonary disease. Fatty infiltration of the liver. Electronically Signed   By: Rolm Baptise M.D.   On: 10/25/2017 14:04    EKG: Independently reviewed.  Sinus rhythm without any acute changes  Assessment/Plan Active Problems:   Depression   Essential hypertension   N&V (nausea and vomiting)   Hypokalemia   GERD (gastroesophageal reflux disease)   Chest pain   Hypertensive urgency     1. Hypertensive urgency.  Review of prior records indicate that patient has long-standing high blood pressure.  Previous office visits with various providers show that her blood pressure has been running between 150-180.  She does have a blood pressure machine at home and reports that on average her blood pressure runs in the 150s.  Considering she is chronically elevated blood pressure, we  will start her on lisinopril.  This can be further titrated as an outpatient.  Monitor blood pressure at this time 2. Headache.  Related to severe hypertension.  This is improved since patient received hydralazine and blood pressure improved. 3. Nausea and vomiting.  Suspect related to p.o. intake, possible gastroenteritis.  Treat supportively with antiemetics and IV fluids. 4. GERD.  Continue on PPI 5. Chest pain.  She has mild elevation of troponin at 0.03, but EKG is unrevealing.  Will cycle troponins.  Suspect is related to GI cause. 6. Hypokalemia.   Related to persistent vomiting and poor p.o. intake.  Will replace.  Check magnesium. 7. Depression.  Continue Lexapro  DVT prophylaxis:  lovenox Code Status: Full code Family Communication: Discussed with husband, sister and son at the bedside Disposition Plan: Discharge home once improved Consults called:   Admission status: Observation, telemetry  Kathie Dike MD [facility] Pager [phone]  If 7PM-7AM, please contact night-coverage [email or web address] Password [facility]  10/25/2017, 7:27 PM

## 2017-10-25 NOTE — Progress Notes (Signed)
BP (!) 182/101 (BP Location: Left Arm, Patient Position: Sitting, Cuff Size: Normal)   Pulse 71   Temp 97.7 F (36.5 C) (Oral)   Ht 5\' 6"  (1.676 m)   Wt 176 lb (79.8 kg)   BMI 28.41 kg/m    Subjective:    Patient ID: Theresa Reyes, female    DOB: May 08, 1956, 62 y.o.   MRN: [id number]  HPI: Theresa Reyes is a 62 y.o. female presenting on 10/25/2017 for ER follow up (nausea and vomiting) and Chest pain, elevated BP at home, shortness of breath (chest pain began at home today)   HPI Patient comes in today for ER follow-up with chest pain and nausea vomiting Patient comes in today with chest pain and nausea and vomiting.  She says this started Friday and she was seen in the emergency department then.  She does admit that on Thursday they had some sausages and some fattier meats which she is not supposed to tolerate because of her history of pancreaticoduodenectomy.  She said she started feeling nausea and vomiting on Friday and was seen in the emergency department where they were able to suppress some of her nausea vomiting with some IV Phenergan and Zofran.  She did better yesterday but then today she started up with a nausea and vomited again and now she is describing a substernal chest pressure that started earlier this morning and she is having tingling and numbness in the tips of both of her fingers of her right hand.  She describes the chest pain as a pressure that substernal and tight.  On exam she does have the Levine sign.  Patient has not been keeping down much food or fluids over the weekend.  She did get a little bit down yesterday but none today  Relevant past medical, surgical, family and social history reviewed and updated as indicated. Interim medical history since our last visit reviewed. Allergies and medications reviewed and updated.  Review of Systems  Constitutional: Negative for chills and fever.  HENT: Negative for congestion, ear discharge and ear pain.   Eyes:  Negative for visual disturbance.  Respiratory: Positive for chest tightness. Negative for shortness of breath.   Cardiovascular: Positive for chest pain and palpitations. Negative for leg swelling.  Gastrointestinal: Positive for abdominal pain, nausea and vomiting. Negative for blood in stool, constipation and diarrhea.  Genitourinary: Positive for decreased urine volume. Negative for difficulty urinating and dysuria.  Musculoskeletal: Negative for back pain and gait problem.  Skin: Negative for rash.  Neurological: Positive for numbness. Negative for dizziness, weakness, light-headedness and headaches.  Psychiatric/Behavioral: Negative for agitation and behavioral problems.  All other systems reviewed and are negative.   Per HPI unless specifically indicated above   Allergies as of 10/25/2017   No Known Allergies     Medication List        Accurate as of 10/25/17  9:29 AM. Always use your most recent med list.          escitalopram 20 MG tablet Commonly known as:  LEXAPRO TAKE 1 TABLET BY MOUTH EVERY DAY   ondansetron 4 MG disintegrating tablet Commonly known as:  ZOFRAN-ODT Take 4 mg by mouth every 8 (eight) hours as needed for nausea or vomiting.   pantoprazole 40 MG tablet Commonly known as:  PROTONIX Take 1 tablet (40 mg total) by mouth daily.   promethazine 25 MG tablet Commonly known as:  PHENERGAN Take 1 tablet (25 mg total) by mouth every 6 (six) hours  as needed for nausea or vomiting.   promethazine 25 MG suppository Commonly known as:  PHENERGAN Place 1 suppository (25 mg total) rectally every 6 (six) hours as needed for nausea or vomiting.          Objective:    BP (!) 182/101 (BP Location: Left Arm, Patient Position: Sitting, Cuff Size: Normal)   Pulse 71   Temp 97.7 F (36.5 C) (Oral)   Ht 5\' 6"  (1.676 m)   Wt 176 lb (79.8 kg)   BMI 28.41 kg/m   Wt Readings from Last 3 Encounters:  10/25/17 176 lb (79.8 kg)  10/22/17 180 lb (81.6 kg)    11/11/16 176 lb (79.8 kg)    Physical Exam  Constitutional: She is oriented to person, place, and time. She appears well-developed and well-nourished. No distress.  Eyes: Conjunctivae are normal.  Cardiovascular: Normal rate, regular rhythm, normal heart sounds and intact distal pulses. Exam reveals no friction rub.  No murmur heard. Pulmonary/Chest: Effort normal and breath sounds normal. No respiratory distress. She has no wheezes. She has no rales. She exhibits no tenderness (No reproducible chest tenderness).  Abdominal: Soft. Bowel sounds are normal. She exhibits no distension. There is no tenderness. There is no rebound and no guarding.  Neurological: She is alert and oriented to person, place, and time.  Skin: Skin is warm and dry. No rash noted. She is not diaphoretic.  Psychiatric: She has a normal mood and affect. Her behavior is normal.  Nursing note and vitals reviewed.   Results for orders placed or performed during the hospital encounter of 10/22/17  Lipase, blood  Result Value Ref Range   Lipase 21 11 - 51 U/L  Comprehensive metabolic panel  Result Value Ref Range   Sodium 137 135 - 145 mmol/L   Potassium 3.5 3.5 - 5.1 mmol/L   Chloride 103 101 - 111 mmol/L   CO2 22 22 - 32 mmol/L   Glucose, Bld 159 (H) 65 - 99 mg/dL   BUN 14 6 - 20 mg/dL   Creatinine, Ser 0.70 0.44 - 1.00 mg/dL   Calcium 9.7 8.9 - 10.3 mg/dL   Total Protein 8.3 (H) 6.5 - 8.1 g/dL   Albumin 4.5 3.5 - 5.0 g/dL   AST 19 15 - 41 U/L   ALT 16 14 - 54 U/L   Alkaline Phosphatase 58 38 - 126 U/L   Total Bilirubin 0.8 0.3 - 1.2 mg/dL   GFR calc non Af Amer >60 >60 mL/min   GFR calc Af Amer >60 >60 mL/min   Anion gap 12 5 - 15  CBC  Result Value Ref Range   WBC 15.7 (H) 4.0 - 10.5 K/uL   RBC 4.95 3.87 - 5.11 MIL/uL   Hemoglobin 14.4 12.0 - 15.0 g/dL   HCT 43.4 36.0 - 46.0 %   MCV 87.7 78.0 - 100.0 fL   MCH 29.1 26.0 - 34.0 pg   MCHC 33.2 30.0 - 36.0 g/dL   RDW 14.2 11.5 - 15.5 %   Platelets 289  150 - 400 K/uL  Urinalysis, Routine w reflex microscopic  Result Value Ref Range   Color, Urine YELLOW YELLOW   APPearance CLEAR CLEAR   Specific Gravity, Urine 1.015 1.005 - 1.030   pH 7.0 5.0 - 8.0   Glucose, UA NEGATIVE NEGATIVE mg/dL   Hgb urine dipstick LARGE (A) NEGATIVE   Bilirubin Urine NEGATIVE NEGATIVE   Ketones, ur 20 (A) NEGATIVE mg/dL   Protein, ur 30 (A)  NEGATIVE mg/dL   Nitrite NEGATIVE NEGATIVE   Leukocytes, UA NEGATIVE NEGATIVE   RBC / HPF 21-50 0 - 5 RBC/hpf   WBC, UA 0-5 0 - 5 WBC/hpf   Bacteria, UA NONE SEEN NONE SEEN   Squamous Epithelial / LPF 0-5 0 - 5  Troponin I  Result Value Ref Range   Troponin I <0.03 <0.03 ng/mL    EKG: Normal sinus rhythm with left axis deviation.  T waves in V3 through V6 are positive, compared to EKG done 3 days ago in the emergency department they were inverted then, shows T wave changes    Assessment & Plan:   Problem List Items Addressed This Visit    None    Visit Diagnoses    Chest pain, unspecified type    -  Primary   Relevant Orders   EKG 12-Lead (Completed)   Dehydration       Relevant Medications   0.9 %  sodium chloride infusion   Hypertensive urgency         Patient was sent via EMS to the emergency department, likely blood pressure elevated because of chest pressure she is having, patient is also likely dehydrated, concern for hypertensive urgency versus ischemic cardiomyopathy due to dehydration.  She will be seen in the emergency department and will follow up with [place] after  Follow up plan: Return if symptoms worsen or fail to improve.  Counseling provided for all of the vaccine components Orders Placed This Encounter  Procedures  . EKG 12-Lead    Caryl Pina, MD [facility] 10/25/2017, 9:29 AM

## 2017-10-25 NOTE — Progress Notes (Signed)
Denies chest pain and nauses

## 2017-10-25 NOTE — ED Triage Notes (Signed)
Patient brought in by EMS from PCP office for vomiting x 4 days and chest pain.

## 2017-10-26 DIAGNOSIS — R0789 Other chest pain: Secondary | ICD-10-CM | POA: Diagnosis not present

## 2017-10-26 DIAGNOSIS — I10 Essential (primary) hypertension: Secondary | ICD-10-CM | POA: Diagnosis not present

## 2017-10-26 DIAGNOSIS — I160 Hypertensive urgency: Secondary | ICD-10-CM | POA: Diagnosis not present

## 2017-10-26 DIAGNOSIS — K210 Gastro-esophageal reflux disease with esophagitis: Secondary | ICD-10-CM | POA: Diagnosis not present

## 2017-10-26 LAB — CBC
HCT: 43.2 % (ref 36.0–46.0)
Hemoglobin: 14.8 g/dL (ref 12.0–15.0)
MCH: 29.5 pg (ref 26.0–34.0)
MCHC: 34.3 g/dL (ref 30.0–36.0)
MCV: 86.2 fL (ref 78.0–100.0)
PLATELETS: 294 10*3/uL (ref 150–400)
RBC: 5.01 MIL/uL (ref 3.87–5.11)
RDW: 13.9 % (ref 11.5–15.5)
WBC: 12.6 10*3/uL — ABNORMAL HIGH (ref 4.0–10.5)

## 2017-10-26 LAB — BASIC METABOLIC PANEL
ANION GAP: 10 (ref 5–15)
BUN: 16 mg/dL (ref 6–20)
CO2: 22 mmol/L (ref 22–32)
CREATININE: 0.86 mg/dL (ref 0.44–1.00)
Calcium: 9 mg/dL (ref 8.9–10.3)
Chloride: 100 mmol/L — ABNORMAL LOW (ref 101–111)
GFR calc Af Amer: >60 mL/min (ref >60)
GLUCOSE: 117 mg/dL — AB (ref 65–99)
Potassium: 3.3 mmol/L — ABNORMAL LOW (ref 3.5–5.1)
Sodium: 132 mmol/L — ABNORMAL LOW (ref 135–145)

## 2017-10-26 LAB — MAGNESIUM: Magnesium: 2.2 mg/dL (ref 1.7–2.4)

## 2017-10-26 LAB — TROPONIN I
TROPONIN I: 0.03 ng/mL — AB (ref <0.03)
TROPONIN I: 0.03 ng/mL — AB (ref <0.03)

## 2017-10-26 MED ORDER — POTASSIUM CHLORIDE CRYS ER 20 MEQ PO TBCR
40.0000 meq | EXTENDED_RELEASE_TABLET | Freq: Once | ORAL | Status: AC
  Administered 2017-10-26: 40 meq via ORAL
  Filled 2017-10-26: qty 2

## 2017-10-26 MED ORDER — LISINOPRIL 10 MG PO TABS
20.0000 mg | ORAL_TABLET | Freq: Every day | ORAL | Status: DC
  Administered 2017-10-27: 20 mg via ORAL
  Filled 2017-10-26: qty 2

## 2017-10-26 MED ORDER — LISINOPRIL 10 MG PO TABS
10.0000 mg | ORAL_TABLET | ORAL | Status: AC
  Administered 2017-10-26: 10 mg via ORAL
  Filled 2017-10-26: qty 1

## 2017-10-26 MED ORDER — GI COCKTAIL ~~LOC~~
30.0000 mL | Freq: Three times a day (TID) | ORAL | Status: DC
  Administered 2017-10-26 – 2017-10-27 (×4): 30 mL via ORAL
  Filled 2017-10-26 (×4): qty 30

## 2017-10-26 NOTE — Progress Notes (Signed)
PROGRESS NOTE    Theresa Reyes  KXF:[id number] DOB: 1955/11/09 DOA: 10/25/2017 PCP: Chevis Pretty, FNP    Brief Narrative:  62 year old female admitted to the hospital with nausea and vomiting and hypertensive urgency with a systolic blood pressure greater than 200.  She had associated headache.  She is been started on lisinopril for hypertension.  Nausea and vomiting likely related to gastritis.  Breathing provided supportive treatment.  Plan is to discharge home when she is able to tolerate p.o.   Assessment & Plan:   Active Problems:   Depression   Essential hypertension   N&V (nausea and vomiting)   Hypokalemia   GERD (gastroesophageal reflux disease)   Chest pain   Hypertensive urgency   1. Hypertensive urgency.  Blood pressure remains elevated.  She is been started on lisinopril.  Continue to monitor.  She will need further titration of medications as an outpatient. 2. Headache.  Related to severe hypertension.  Improved since arrival to the hospital.  At the time of admission, systolic blood pressures greater than 200. 3. Nausea vomiting.  Suspect is related to gastroenteritis.  Occurred after eating some fried sausage.  Treat supportively with antiemetics.  She is not having any fever or abdominal pain.  LFTs noted to be normal on admission. 4. GERD.  Continue on PPI.  GI cocktail as needed. 5. Chest pain.  She is ruled out for ACS with negative cardiac markers.  EKG is unrevealing.  Likely related to GI cause 6. Hypokalemia.  Related to persistent vomiting.  This is been replaced.  Magnesium normal. 7. Depression.  Continue Lexapro   DVT prophylaxis: Lovenox Code Status: Full code Family Communication: No family present Disposition Plan: Discharge home once tolerating diet   Consultants:     Procedures:     Antimicrobials:      Subjective: Still has some nausea and vomiting.  Has some epigastric discomfort.  Had vomiting this morning was unable to  keep any liquids down.  Also had some diarrhea this morning  Objective: Vitals:   10/25/17 1649 10/25/17 2119 10/26/17 0546 10/26/17 1417  BP: (!) 177/83 (!) 189/83 (!) 145/77 (!) 192/89  Pulse: 69 67 74 (!) 59  Resp: 20 18  16   Temp: 99.4 F (37.4 C) 98.3 F (36.8 C) 98.4 F (36.9 C) 99.7 F (37.6 C)  TempSrc: Oral Oral Oral Oral  SpO2: 99% 94% 97% 97%  Weight:      Height:        Intake/Output Summary (Last 24 hours) at 10/26/2017 1834 Last data filed at 10/26/2017 0800 Gross per 24 hour  Intake 1154.58 ml  Output -  Net 1154.58 ml   Filed Weights   10/25/17 1021  Weight: 79.8 kg (176 lb)    Examination:  General exam: Appears calm and comfortable  Respiratory system: Clear to auscultation. Respiratory effort normal. Cardiovascular system: S1 & S2 heard, RRR. No JVD, murmurs, rubs, gallops or clicks. No pedal edema. Gastrointestinal system: Abdomen is nondistended, soft and nontender. No organomegaly or masses felt. Normal bowel sounds heard. Central nervous system: Alert and oriented. No focal neurological deficits. Extremities: Symmetric 5 x 5 power. Skin: No rashes, lesions or ulcers Psychiatry: Judgement and insight appear normal. Mood & affect appropriate.     Data Reviewed: I have personally reviewed following labs and imaging studies  CBC: Recent Labs  Lab 10/22/17 1714 10/25/17 1050 10/26/17 0118  WBC 15.7* 13.5* 12.6*  NEUTROABS  --  11.2*  --   HGB 14.4  16.1* 14.8  HCT 43.4 46.5* 43.2  MCV 87.7 86.4 86.2  PLT 289 288 379   Basic Metabolic Panel: Recent Labs  Lab 10/22/17 1714 10/25/17 1050 10/26/17 0118  NA 137 134* 132*  K 3.5 3.2* 3.3*  CL 103 97* 100*  CO2 22 25 22   GLUCOSE 159* 124* 117*  BUN 14 21* 16  CREATININE 0.70 1.05* 0.86  CALCIUM 9.7 9.4 9.0  MG  --   --  2.2   GFR: Estimated Creatinine Clearance: 73.2 mL/min (by C-G formula based on SCr of 0.86 mg/dL). Liver Function Tests: Recent Labs  Lab 10/22/17 1714  10/25/17 1050  AST 19 15  ALT 16 14  ALKPHOS 58 60  BILITOT 0.8 0.9  PROT 8.3* 8.0  ALBUMIN 4.5 4.3   Recent Labs  Lab 10/22/17 1714  LIPASE 21   No results for input(s): AMMONIA in the last 168 hours. Coagulation Profile: No results for input(s): INR, PROTIME in the last 168 hours. Cardiac Enzymes: Recent Labs  Lab 10/22/17 1714 10/25/17 1050 10/25/17 2002 10/26/17 0118 10/26/17 0803  TROPONINI <0.03 0.03* 0.03* 0.03* 0.03*   BNP (last 3 results) No results for input(s): PROBNP in the last 8760 hours. HbA1C: No results for input(s): HGBA1C in the last 72 hours. CBG: No results for input(s): GLUCAP in the last 168 hours. Lipid Profile: No results for input(s): CHOL, HDL, LDLCALC, TRIG, CHOLHDL, LDLDIRECT in the last 72 hours. Thyroid Function Tests: No results for input(s): TSH, T4TOTAL, FREET4, T3FREE, THYROIDAB in the last 72 hours. Anemia Panel: No results for input(s): VITAMINB12, FOLATE, FERRITIN, TIBC, IRON, RETICCTPCT in the last 72 hours. Sepsis Labs: No results for input(s): PROCALCITON, LATICACIDVEN in the last 168 hours.  No results found for this or any previous visit (from the past 240 hour(s)).       Radiology Studies: Ct Angio Head W Or Wo Contrast  Result Date: 10/25/2017 CLINICAL DATA:  Headache, acute, normal neuro exam. Four-day history of chest pain. Hypertensive urgency. EXAM: CT ANGIOGRAPHY HEAD AND NECK TECHNIQUE: Multidetector CT imaging of the head and neck was performed using the standard protocol during bolus administration of intravenous contrast. Multiplanar CT image reconstructions and MIPs were obtained to evaluate the vascular anatomy. Carotid stenosis measurements (when applicable) are obtained utilizing NASCET criteria, using the distal internal carotid diameter as the denominator. CONTRAST:  175mL ISOVUE-370 IOPAMIDOL (ISOVUE-370) INJECTION 76% COMPARISON:  None. FINDINGS: CT HEAD FINDINGS Brain: Noncontrast imaging the brain is  unremarkable. No acute infarct, hemorrhage, or mass lesion is present. The ventricles are of normal size. No significant white matter disease is present. The brainstem and cerebellum are normal. No significant extra-axial fluid collection is present. Vascular: Faint atherosclerotic calcifications are present within the cavernous internal carotid arteries. There is no hyperdense vessel. Skull: Calvarium is intact. No focal lytic or blastic lesions are present. No significant extracranial soft tissue injury is present. Sinuses: The paranasal sinuses and mastoid air cells are clear. Orbits: Globes and orbits are within normal limits. Review of the MIP images confirms the above findings CTA NECK FINDINGS Aortic arch: A 3 vessel arch configuration is present. No significant atherosclerotic changes are present. There is no significant stenosis at the origins of the great vessels. Right carotid system: Mild proximal tortuosity is present in the cervical right ICA. The right carotid bifurcation demonstrates minimal atherosclerotic change without significant luminal stenosis. Mild tortuosity is present in the cervical right ICA. A focal beaded appearance in the mid cervical right ICA suggests  fibromuscular dysplasia. Left carotid system: The left common carotid artery is within normal limits. Bifurcation is unremarkable. There is some tortuosity of the cervical left ICA without significant stenosis. Minimal irregularity is present on the left, less so than on the right. Vertebral arteries: The vertebral arteries originate from the subclavian arteries bilaterally. There is no significant stenosis at the vertebral artery origins. Atherosclerotic calcifications are present along the wall of the right vertebral artery. There is tortuosity of the left vertebral artery particular. Vertebral arteries are otherwise codominant. No focal stenosis or vascular injury is evident in the neck. Skeleton: Degenerative anterolisthesis present  at C4-5 and C6-7. Other neck: Bilateral subcentimeter lymph nodes are present. No significant cervical adenopathy is present. Salivary glands are within normal limits. Vocal cords are midline and symmetric. The thyroid is within normal limits. Upper chest: Centrilobular emphysematous changes are present at the lung apices. No focal nodule, mass, or airspace disease is present. Review of the MIP images confirms the above findings CTA HEAD FINDINGS Anterior circulation: Atherosclerotic calcifications are present within the cavernous internal carotid arteries bilaterally. There is no significant luminal stenosis through the ICA terminus. A1 and M1 segments are normal. The anterior communicating artery is patent. MCA bifurcations are intact. There is moderate attenuation of MCA branch vessels without a significant proximal stenosis or occlusion. ACA branch vessels are within normal limits. Posterior circulation: The left vertebral artery is slightly dominant to the right. PICA origins are visualized and normal. The basilar artery is normal. Both posterior cerebral arteries originate from the basilar tip. There is moderate attenuation of distal PCA branch vessels without a significant proximal stenosis or occlusion. No aneurysms are present. Venous sinuses: The dural sinuses are patent. The straight sinus and deep cerebral veins are intact. Cortical veins are unremarkable. Anatomic variants: None Delayed phase: Postcontrast images demonstrate no pathologic enhancement. Review of the MIP images confirms the above findings IMPRESSION: 1. Atherosclerotic calcifications involving the cavernous internal carotid arteries bilaterally without focal stenosis. 2. Focal vessel irregularity in the mid cervical internal carotid arteries, right greater than left raises concern for fibromuscular dysplasia. 3. No other significant proximal stenosis, aneurysm, or branch vessel occlusion. 4. Mild degenerative changes of the cervical spine  as described. 5.  Emphysema (ICD10-J43.9). Electronically Signed   By: San Morelle M.D.   On: 10/25/2017 14:16   Dg Chest 2 View  Result Date: 10/25/2017 CLINICAL DATA:  Vomiting for the past 3 days. Onset of chest pain and shortness of breath today. Also headache. Current smoker EXAM: CHEST - 2 VIEW COMPARISON:  PA and lateral chest x-ray of August 13, 2016 FINDINGS: The lungs are well-expanded and clear. The heart and pulmonary vascularity are normal. The mediastinum is normal in width. There is faint calcification in the wall of the aortic arch. There is no pleural effusion. The bony thorax exhibits no acute abnormality. IMPRESSION: There is no pneumonia nor other acute cardiopulmonary abnormality. Thoracic aortic atherosclerosis. Electronically Signed   By: David  Martinique M.D.   On: 10/25/2017 11:46   Ct Angio Neck W And/or Wo Contrast  Result Date: 10/25/2017 CLINICAL DATA:  Headache, acute, normal neuro exam. Four-day history of chest pain. Hypertensive urgency. EXAM: CT ANGIOGRAPHY HEAD AND NECK TECHNIQUE: Multidetector CT imaging of the head and neck was performed using the standard protocol during bolus administration of intravenous contrast. Multiplanar CT image reconstructions and MIPs were obtained to evaluate the vascular anatomy. Carotid stenosis measurements (when applicable) are obtained utilizing NASCET criteria, using the distal internal  carotid diameter as the denominator. CONTRAST:  129mL ISOVUE-370 IOPAMIDOL (ISOVUE-370) INJECTION 76% COMPARISON:  None. FINDINGS: CT HEAD FINDINGS Brain: Noncontrast imaging the brain is unremarkable. No acute infarct, hemorrhage, or mass lesion is present. The ventricles are of normal size. No significant white matter disease is present. The brainstem and cerebellum are normal. No significant extra-axial fluid collection is present. Vascular: Faint atherosclerotic calcifications are present within the cavernous internal carotid arteries. There is no  hyperdense vessel. Skull: Calvarium is intact. No focal lytic or blastic lesions are present. No significant extracranial soft tissue injury is present. Sinuses: The paranasal sinuses and mastoid air cells are clear. Orbits: Globes and orbits are within normal limits. Review of the MIP images confirms the above findings CTA NECK FINDINGS Aortic arch: A 3 vessel arch configuration is present. No significant atherosclerotic changes are present. There is no significant stenosis at the origins of the great vessels. Right carotid system: Mild proximal tortuosity is present in the cervical right ICA. The right carotid bifurcation demonstrates minimal atherosclerotic change without significant luminal stenosis. Mild tortuosity is present in the cervical right ICA. A focal beaded appearance in the mid cervical right ICA suggests fibromuscular dysplasia. Left carotid system: The left common carotid artery is within normal limits. Bifurcation is unremarkable. There is some tortuosity of the cervical left ICA without significant stenosis. Minimal irregularity is present on the left, less so than on the right. Vertebral arteries: The vertebral arteries originate from the subclavian arteries bilaterally. There is no significant stenosis at the vertebral artery origins. Atherosclerotic calcifications are present along the wall of the right vertebral artery. There is tortuosity of the left vertebral artery particular. Vertebral arteries are otherwise codominant. No focal stenosis or vascular injury is evident in the neck. Skeleton: Degenerative anterolisthesis present at C4-5 and C6-7. Other neck: Bilateral subcentimeter lymph nodes are present. No significant cervical adenopathy is present. Salivary glands are within normal limits. Vocal cords are midline and symmetric. The thyroid is within normal limits. Upper chest: Centrilobular emphysematous changes are present at the lung apices. No focal nodule, mass, or airspace disease  is present. Review of the MIP images confirms the above findings CTA HEAD FINDINGS Anterior circulation: Atherosclerotic calcifications are present within the cavernous internal carotid arteries bilaterally. There is no significant luminal stenosis through the ICA terminus. A1 and M1 segments are normal. The anterior communicating artery is patent. MCA bifurcations are intact. There is moderate attenuation of MCA branch vessels without a significant proximal stenosis or occlusion. ACA branch vessels are within normal limits. Posterior circulation: The left vertebral artery is slightly dominant to the right. PICA origins are visualized and normal. The basilar artery is normal. Both posterior cerebral arteries originate from the basilar tip. There is moderate attenuation of distal PCA branch vessels without a significant proximal stenosis or occlusion. No aneurysms are present. Venous sinuses: The dural sinuses are patent. The straight sinus and deep cerebral veins are intact. Cortical veins are unremarkable. Anatomic variants: None Delayed phase: Postcontrast images demonstrate no pathologic enhancement. Review of the MIP images confirms the above findings IMPRESSION: 1. Atherosclerotic calcifications involving the cavernous internal carotid arteries bilaterally without focal stenosis. 2. Focal vessel irregularity in the mid cervical internal carotid arteries, right greater than left raises concern for fibromuscular dysplasia. 3. No other significant proximal stenosis, aneurysm, or branch vessel occlusion. 4. Mild degenerative changes of the cervical spine as described. 5.  Emphysema (ICD10-J43.9). Electronically Signed   By: San Morelle M.D.   On: 10/25/2017  14:16   Ct Angio Chest Aorta W And/or Wo Contrast  Result Date: 10/25/2017 CLINICAL DATA:  Chest pain, hypertension EXAM: CT ANGIOGRAPHY CHEST WITH CONTRAST TECHNIQUE: Multidetector CT imaging of the chest was performed using the standard protocol  during bolus administration of intravenous contrast. Multiplanar CT image reconstructions and MIPs were obtained to evaluate the vascular anatomy. CONTRAST:  119mL ISOVUE-370 IOPAMIDOL (ISOVUE-370) INJECTION 76% COMPARISON:  None. FINDINGS: Cardiovascular: No evidence of aortic aneurysm or dissection. Heart is normal size. Mediastinum/Nodes: No mediastinal, hilar, or axillary adenopathy. Lungs/Pleura: Lungs are clear. No focal airspace opacities or suspicious nodules. No effusions. Upper Abdomen: Imaging into the upper abdomen shows no acute findings. Fatty infiltration of the liver. Prior cholecystectomy. Musculoskeletal: Chest wall soft tissues are unremarkable. No acute bony abnormality. Review of the MIP images confirms the above findings. IMPRESSION: No evidence of aortic aneurysm or dissection. No acute cardiopulmonary disease. Fatty infiltration of the liver. Electronically Signed   By: Rolm Baptise M.D.   On: 10/25/2017 14:04        Scheduled Meds: . enoxaparin (LOVENOX) injection  40 mg Subcutaneous Q24H  . escitalopram  20 mg Oral Daily  . gi cocktail  30 mL Oral TID  . [START ON 10/27/2017] lisinopril  20 mg Oral Daily  . pantoprazole  40 mg Oral Daily   Continuous Infusions: . 0.9 % NaCl with KCl 40 mEq / L 125 mL/hr (10/26/17 0627)     LOS: 0 days    Time spent: 69mins    Kathie Dike, MD [facility] Pager [phone]  If 7PM-7AM, please contact night-coverage [email or web address] Password [facility] 10/26/2017, 6:34 PM

## 2017-10-27 DIAGNOSIS — I10 Essential (primary) hypertension: Secondary | ICD-10-CM | POA: Diagnosis not present

## 2017-10-27 DIAGNOSIS — I160 Hypertensive urgency: Secondary | ICD-10-CM | POA: Diagnosis not present

## 2017-10-27 LAB — COMPREHENSIVE METABOLIC PANEL
ALT: 10 U/L — ABNORMAL LOW (ref 14–54)
ANION GAP: 4 — AB (ref 5–15)
AST: 11 U/L — ABNORMAL LOW (ref 15–41)
Albumin: 3.3 g/dL — ABNORMAL LOW (ref 3.5–5.0)
Alkaline Phosphatase: 41 U/L (ref 38–126)
BILIRUBIN TOTAL: 0.6 mg/dL (ref 0.3–1.2)
BUN: 12 mg/dL (ref 6–20)
CO2: 23 mmol/L (ref 22–32)
Calcium: 8.6 mg/dL — ABNORMAL LOW (ref 8.9–10.3)
Chloride: 111 mmol/L (ref 101–111)
Creatinine, Ser: 0.66 mg/dL (ref 0.44–1.00)
GFR calc Af Amer: >60 mL/min (ref >60)
Glucose, Bld: 93 mg/dL (ref 65–99)
POTASSIUM: 4.4 mmol/L (ref 3.5–5.1)
Sodium: 138 mmol/L (ref 135–145)
TOTAL PROTEIN: 5.7 g/dL — AB (ref 6.5–8.1)

## 2017-10-27 LAB — CBC
HEMATOCRIT: 36.7 % (ref 36.0–46.0)
HEMOGLOBIN: 11.9 g/dL — AB (ref 12.0–15.0)
MCH: 28.9 pg (ref 26.0–34.0)
MCHC: 32.4 g/dL (ref 30.0–36.0)
MCV: 89.1 fL (ref 78.0–100.0)
Platelets: 222 10*3/uL (ref 150–400)
RBC: 4.12 MIL/uL (ref 3.87–5.11)
RDW: 14.2 % (ref 11.5–15.5)
WBC: 9.2 10*3/uL (ref 4.0–10.5)

## 2017-10-27 LAB — HIV ANTIBODY (ROUTINE TESTING W REFLEX): HIV Screen 4th Generation wRfx: NONREACTIVE

## 2017-10-27 LAB — HEMOGLOBIN AND HEMATOCRIT, BLOOD
HEMATOCRIT: 38.3 % (ref 36.0–46.0)
Hemoglobin: 12.4 g/dL (ref 12.0–15.0)

## 2017-10-27 MED ORDER — LISINOPRIL 20 MG PO TABS: 20.0000 mg | ORAL_TABLET | Freq: Every day | ORAL | 2 refills | Status: DC

## 2017-10-27 NOTE — Discharge Summary (Signed)
Physician Discharge Summary  Theresa Reyes AXK:[id number] DOB: 1955-10-20 DOA: 10/25/2017  PCP: Chevis Pretty, FNP  Admit date: 10/25/2017  Discharge date: 10/27/2017  Admitted From:Home  Disposition:  Home  Recommendations for Outpatient Follow-up:  1. Follow up with PCP in 1 week for BP recheck  Home Health:N/A  Equipment/Devices:N/A  Discharge Condition:Stable  CODE STATUS: Full  Diet recommendation: Heart Healthy  Brief/Interim Summary: 62 year old female admitted to the hospital with nausea and vomiting and hypertensive urgency with a systolic blood pressure greater than 200.  She had associated headache. She has been started on lisinopril for hypertension and has had good improvement in her blood pressure readings.  She appears to be tolerating this well.  Her gastritis which appears to be the cause of her nausea and vomiting has also resolved.  She is now tolerating p.o. and has no further symptomatology noted.  She is noted to have some mild anemia, but this appears to remain stable.   Discharge Diagnoses:  Active Problems:   Depression   Essential hypertension   N&V (nausea and vomiting)   Hypokalemia   GERD (gastroesophageal reflux disease)   Chest pain   Hypertensive urgency  1. Hypertensive urgency-resolved.  Blood pressure has improved.  She is been started on lisinopril 20mg  daily.  Continue to monitor in 1 week with PCP.  She will need further titration of medications as an outpatient. 2. Headache-resolved.  Related to severe hypertension.  Improved since arrival to the hospital.  At the time of admission, systolic blood pressures greater than 200. 3. Nausea vomiting-resolved.  Suspect is related to gastroenteritis.  Occurred after eating some fried sausage and works in a [employer].  Treat supportively with antiemetics.  She is not having any fever or abdominal pain.  LFTs noted to be normal on admission. 4. GERD.  Continue on PPI.  GI cocktail as  needed. 5. Chest pain-resolved.  She is ruled out for ACS with negative cardiac markers.  EKG is unrevealing.  Likely related to GI cause 6. Hypokalemia-resolved. 7. Depression.  Continue Lexapro     Discharge Instructions  Discharge Instructions    Diet - low sodium heart healthy   Complete by:  As directed    Increase activity slowly   Complete by:  As directed      Allergies as of 10/27/2017   No Known Allergies     Medication List    TAKE these medications   escitalopram 20 MG tablet Commonly known as:  LEXAPRO TAKE 1 TABLET BY MOUTH EVERY DAY   lisinopril 20 MG tablet Commonly known as:  PRINIVIL,ZESTRIL Take 1 tablet (20 mg total) by mouth daily. Start taking on:  10/28/2017   ondansetron 4 MG disintegrating tablet Commonly known as:  ZOFRAN-ODT Take 4 mg by mouth every 8 (eight) hours as needed for nausea or vomiting.   pantoprazole 40 MG tablet Commonly known as:  PROTONIX Take 1 tablet (40 mg total) by mouth daily.   promethazine 25 MG tablet Commonly known as:  PHENERGAN Take 1 tablet (25 mg total) by mouth every 6 (six) hours as needed for nausea or vomiting.   promethazine 25 MG suppository Commonly known as:  PHENERGAN Place 1 suppository (25 mg total) rectally every 6 (six) hours as needed for nausea or vomiting.      Follow-up Information    Hassell Done, Mary-Margaret, FNP Follow up in 1 week(s).   Specialty:  Family Medicine Why:  Recheck BP. Contact information: [address] [place] [address]  [phone]          No Known Allergies  Consultations:  None   Procedures/Studies: Ct Angio Head W Or Wo Contrast  Result Date: 10/25/2017 CLINICAL DATA:  Headache, acute, normal neuro exam. Four-day history of chest pain. Hypertensive urgency. EXAM: CT ANGIOGRAPHY HEAD AND NECK TECHNIQUE: Multidetector CT imaging of the head and neck was performed using the standard protocol during bolus administration of intravenous contrast.  Multiplanar CT image reconstructions and MIPs were obtained to evaluate the vascular anatomy. Carotid stenosis measurements (when applicable) are obtained utilizing NASCET criteria, using the distal internal carotid diameter as the denominator. CONTRAST:  163mL ISOVUE-370 IOPAMIDOL (ISOVUE-370) INJECTION 76% COMPARISON:  None. FINDINGS: CT HEAD FINDINGS Brain: Noncontrast imaging the brain is unremarkable. No acute infarct, hemorrhage, or mass lesion is present. The ventricles are of normal size. No significant white matter disease is present. The brainstem and cerebellum are normal. No significant extra-axial fluid collection is present. Vascular: Faint atherosclerotic calcifications are present within the cavernous internal carotid arteries. There is no hyperdense vessel. Skull: Calvarium is intact. No focal lytic or blastic lesions are present. No significant extracranial soft tissue injury is present. Sinuses: The paranasal sinuses and mastoid air cells are clear. Orbits: Globes and orbits are within normal limits. Review of the MIP images confirms the above findings CTA NECK FINDINGS Aortic arch: A 3 vessel arch configuration is present. No significant atherosclerotic changes are present. There is no significant stenosis at the origins of the great vessels. Right carotid system: Mild proximal tortuosity is present in the cervical right ICA. The right carotid bifurcation demonstrates minimal atherosclerotic change without significant luminal stenosis. Mild tortuosity is present in the cervical right ICA. A focal beaded appearance in the mid cervical right ICA suggests fibromuscular dysplasia. Left carotid system: The left common carotid artery is within normal limits. Bifurcation is unremarkable. There is some tortuosity of the cervical left ICA without significant stenosis. Minimal irregularity is present on the left, less so than on the right. Vertebral arteries: The vertebral arteries originate from the  subclavian arteries bilaterally. There is no significant stenosis at the vertebral artery origins. Atherosclerotic calcifications are present along the wall of the right vertebral artery. There is tortuosity of the left vertebral artery particular. Vertebral arteries are otherwise codominant. No focal stenosis or vascular injury is evident in the neck. Skeleton: Degenerative anterolisthesis present at C4-5 and C6-7. Other neck: Bilateral subcentimeter lymph nodes are present. No significant cervical adenopathy is present. Salivary glands are within normal limits. Vocal cords are midline and symmetric. The thyroid is within normal limits. Upper chest: Centrilobular emphysematous changes are present at the lung apices. No focal nodule, mass, or airspace disease is present. Review of the MIP images confirms the above findings CTA HEAD FINDINGS Anterior circulation: Atherosclerotic calcifications are present within the cavernous internal carotid arteries bilaterally. There is no significant luminal stenosis through the ICA terminus. A1 and M1 segments are normal. The anterior communicating artery is patent. MCA bifurcations are intact. There is moderate attenuation of MCA branch vessels without a significant proximal stenosis or occlusion. ACA branch vessels are within normal limits. Posterior circulation: The left vertebral artery is slightly dominant to the right. PICA origins are visualized and normal. The basilar artery is normal. Both posterior cerebral arteries originate from the basilar tip. There is moderate attenuation of distal PCA branch vessels without a significant proximal stenosis or occlusion. No aneurysms are present. Venous sinuses: The dural sinuses are patent. The straight sinus and deep  cerebral veins are intact. Cortical veins are unremarkable. Anatomic variants: None Delayed phase: Postcontrast images demonstrate no pathologic enhancement. Review of the MIP images confirms the above findings  IMPRESSION: 1. Atherosclerotic calcifications involving the cavernous internal carotid arteries bilaterally without focal stenosis. 2. Focal vessel irregularity in the mid cervical internal carotid arteries, right greater than left raises concern for fibromuscular dysplasia. 3. No other significant proximal stenosis, aneurysm, or branch vessel occlusion. 4. Mild degenerative changes of the cervical spine as described. 5.  Emphysema (ICD10-J43.9). Electronically Signed   By: San Morelle M.D.   On: 10/25/2017 14:16   Dg Chest 2 View  Result Date: 10/25/2017 CLINICAL DATA:  Vomiting for the past 3 days. Onset of chest pain and shortness of breath today. Also headache. Current smoker EXAM: CHEST - 2 VIEW COMPARISON:  PA and lateral chest x-ray of August 13, 2016 FINDINGS: The lungs are well-expanded and clear. The heart and pulmonary vascularity are normal. The mediastinum is normal in width. There is faint calcification in the wall of the aortic arch. There is no pleural effusion. The bony thorax exhibits no acute abnormality. IMPRESSION: There is no pneumonia nor other acute cardiopulmonary abnormality. Thoracic aortic atherosclerosis. Electronically Signed   By: David  Martinique M.D.   On: 10/25/2017 11:46   Ct Angio Neck W And/or Wo Contrast  Result Date: 10/25/2017 CLINICAL DATA:  Headache, acute, normal neuro exam. Four-day history of chest pain. Hypertensive urgency. EXAM: CT ANGIOGRAPHY HEAD AND NECK TECHNIQUE: Multidetector CT imaging of the head and neck was performed using the standard protocol during bolus administration of intravenous contrast. Multiplanar CT image reconstructions and MIPs were obtained to evaluate the vascular anatomy. Carotid stenosis measurements (when applicable) are obtained utilizing NASCET criteria, using the distal internal carotid diameter as the denominator. CONTRAST:  142mL ISOVUE-370 IOPAMIDOL (ISOVUE-370) INJECTION 76% COMPARISON:  None. FINDINGS: CT HEAD FINDINGS  Brain: Noncontrast imaging the brain is unremarkable. No acute infarct, hemorrhage, or mass lesion is present. The ventricles are of normal size. No significant white matter disease is present. The brainstem and cerebellum are normal. No significant extra-axial fluid collection is present. Vascular: Faint atherosclerotic calcifications are present within the cavernous internal carotid arteries. There is no hyperdense vessel. Skull: Calvarium is intact. No focal lytic or blastic lesions are present. No significant extracranial soft tissue injury is present. Sinuses: The paranasal sinuses and mastoid air cells are clear. Orbits: Globes and orbits are within normal limits. Review of the MIP images confirms the above findings CTA NECK FINDINGS Aortic arch: A 3 vessel arch configuration is present. No significant atherosclerotic changes are present. There is no significant stenosis at the origins of the great vessels. Right carotid system: Mild proximal tortuosity is present in the cervical right ICA. The right carotid bifurcation demonstrates minimal atherosclerotic change without significant luminal stenosis. Mild tortuosity is present in the cervical right ICA. A focal beaded appearance in the mid cervical right ICA suggests fibromuscular dysplasia. Left carotid system: The left common carotid artery is within normal limits. Bifurcation is unremarkable. There is some tortuosity of the cervical left ICA without significant stenosis. Minimal irregularity is present on the left, less so than on the right. Vertebral arteries: The vertebral arteries originate from the subclavian arteries bilaterally. There is no significant stenosis at the vertebral artery origins. Atherosclerotic calcifications are present along the wall of the right vertebral artery. There is tortuosity of the left vertebral artery particular. Vertebral arteries are otherwise codominant. No focal stenosis or vascular injury is  evident in the neck.  Skeleton: Degenerative anterolisthesis present at C4-5 and C6-7. Other neck: Bilateral subcentimeter lymph nodes are present. No significant cervical adenopathy is present. Salivary glands are within normal limits. Vocal cords are midline and symmetric. The thyroid is within normal limits. Upper chest: Centrilobular emphysematous changes are present at the lung apices. No focal nodule, mass, or airspace disease is present. Review of the MIP images confirms the above findings CTA HEAD FINDINGS Anterior circulation: Atherosclerotic calcifications are present within the cavernous internal carotid arteries bilaterally. There is no significant luminal stenosis through the ICA terminus. A1 and M1 segments are normal. The anterior communicating artery is patent. MCA bifurcations are intact. There is moderate attenuation of MCA branch vessels without a significant proximal stenosis or occlusion. ACA branch vessels are within normal limits. Posterior circulation: The left vertebral artery is slightly dominant to the right. PICA origins are visualized and normal. The basilar artery is normal. Both posterior cerebral arteries originate from the basilar tip. There is moderate attenuation of distal PCA branch vessels without a significant proximal stenosis or occlusion. No aneurysms are present. Venous sinuses: The dural sinuses are patent. The straight sinus and deep cerebral veins are intact. Cortical veins are unremarkable. Anatomic variants: None Delayed phase: Postcontrast images demonstrate no pathologic enhancement. Review of the MIP images confirms the above findings IMPRESSION: 1. Atherosclerotic calcifications involving the cavernous internal carotid arteries bilaterally without focal stenosis. 2. Focal vessel irregularity in the mid cervical internal carotid arteries, right greater than left raises concern for fibromuscular dysplasia. 3. No other significant proximal stenosis, aneurysm, or branch vessel occlusion. 4.  Mild degenerative changes of the cervical spine as described. 5.  Emphysema (ICD10-J43.9). Electronically Signed   By: San Morelle M.D.   On: 10/25/2017 14:16   Ct Angio Chest Aorta W And/or Wo Contrast  Result Date: 10/25/2017 CLINICAL DATA:  Chest pain, hypertension EXAM: CT ANGIOGRAPHY CHEST WITH CONTRAST TECHNIQUE: Multidetector CT imaging of the chest was performed using the standard protocol during bolus administration of intravenous contrast. Multiplanar CT image reconstructions and MIPs were obtained to evaluate the vascular anatomy. CONTRAST:  159mL ISOVUE-370 IOPAMIDOL (ISOVUE-370) INJECTION 76% COMPARISON:  None. FINDINGS: Cardiovascular: No evidence of aortic aneurysm or dissection. Heart is normal size. Mediastinum/Nodes: No mediastinal, hilar, or axillary adenopathy. Lungs/Pleura: Lungs are clear. No focal airspace opacities or suspicious nodules. No effusions. Upper Abdomen: Imaging into the upper abdomen shows no acute findings. Fatty infiltration of the liver. Prior cholecystectomy. Musculoskeletal: Chest wall soft tissues are unremarkable. No acute bony abnormality. Review of the MIP images confirms the above findings. IMPRESSION: No evidence of aortic aneurysm or dissection. No acute cardiopulmonary disease. Fatty infiltration of the liver. Electronically Signed   By: Rolm Baptise M.D.   On: 10/25/2017 14:04   Discharge Exam: Vitals:   10/26/17 2030 10/27/17 0537  BP: 120/66 137/69  Pulse: 63 (!) 54  Resp:    Temp: 98.2 F (36.8 C) 98.2 F (36.8 C)  SpO2: 97% 99%   Vitals:   10/26/17 1417 10/26/17 2015 10/26/17 2030 10/27/17 0537  BP: (!) 192/89  120/66 137/69  Pulse: (!) 59  63 (!) 54  Resp: 16     Temp: 99.7 F (37.6 C)  98.2 F (36.8 C) 98.2 F (36.8 C)  TempSrc: Oral  Oral Oral  SpO2: 97% 98% 97% 99%  Weight:      Height:        General: Pt is alert, awake, not in acute distress Cardiovascular: RRR,  S1/S2 +, no rubs, no gallops Respiratory: CTA  bilaterally, no wheezing, no rhonchi Abdominal: Soft, NT, ND, bowel sounds + Extremities: no edema, no cyanosis    The results of significant diagnostics from this hospitalization (including imaging, microbiology, ancillary and laboratory) are listed below for reference.     Microbiology: No results found for this or any previous visit (from the past 240 hour(s)).   Labs: BNP (last 3 results) No results for input(s): BNP in the last 8760 hours. Basic Metabolic Panel: Recent Labs  Lab 10/22/17 1714 10/25/17 1050 10/26/17 0118 10/27/17 0432  NA 137 134* 132* 138  K 3.5 3.2* 3.3* 4.4  CL 103 97* 100* 111  CO2 22 25 22 23   GLUCOSE 159* 124* 117* 93  BUN 14 21* 16 12  CREATININE 0.70 1.05* 0.86 0.66  CALCIUM 9.7 9.4 9.0 8.6*  MG  --   --  2.2  --    Liver Function Tests: Recent Labs  Lab 10/22/17 1714 10/25/17 1050 10/27/17 0432  AST 19 15 11*  ALT 16 14 10*  ALKPHOS 58 60 41  BILITOT 0.8 0.9 0.6  PROT 8.3* 8.0 5.7*  ALBUMIN 4.5 4.3 3.3*   Recent Labs  Lab 10/22/17 1714  LIPASE 21   No results for input(s): AMMONIA in the last 168 hours. CBC: Recent Labs  Lab 10/22/17 1714 10/25/17 1050 10/26/17 0118 10/27/17 0432 10/27/17 1033  WBC 15.7* 13.5* 12.6* 9.2  --   NEUTROABS  --  11.2*  --   --   --   HGB 14.4 16.1* 14.8 11.9* 12.4  HCT 43.4 46.5* 43.2 36.7 38.3  MCV 87.7 86.4 86.2 89.1  --   PLT 289 288 294 222  --    Cardiac Enzymes: Recent Labs  Lab 10/22/17 1714 10/25/17 1050 10/25/17 2002 10/26/17 0118 10/26/17 0803  TROPONINI <0.03 0.03* 0.03* 0.03* 0.03*   BNP: Invalid input(s): POCBNP CBG: No results for input(s): GLUCAP in the last 168 hours. D-Dimer No results for input(s): DDIMER in the last 72 hours. Hgb A1c No results for input(s): HGBA1C in the last 72 hours. Lipid Profile No results for input(s): CHOL, HDL, LDLCALC, TRIG, CHOLHDL, LDLDIRECT in the last 72 hours. Thyroid function studies No results for input(s): TSH, T4TOTAL,  T3FREE, THYROIDAB in the last 72 hours.  Invalid input(s): FREET3 Anemia work up No results for input(s): VITAMINB12, FOLATE, FERRITIN, TIBC, IRON, RETICCTPCT in the last 72 hours. Urinalysis    Component Value Date/Time   COLORURINE COLORLESS (A) 10/25/2017 1520   APPEARANCEUR CLEAR 10/25/2017 1520   APPEARANCEUR Clear 08/19/2015 1436   LABSPEC >1.046 (H) 10/25/2017 1520   PHURINE 7.0 10/25/2017 1520   GLUCOSEU NEGATIVE 10/25/2017 1520   HGBUR SMALL (A) 10/25/2017 1520   BILIRUBINUR NEGATIVE 10/25/2017 1520   BILIRUBINUR Negative 08/19/2015 1436   KETONESUR NEGATIVE 10/25/2017 1520   PROTEINUR NEGATIVE 10/25/2017 1520   UROBILINOGEN 0.2 07/04/2013 1125   NITRITE NEGATIVE 10/25/2017 1520   LEUKOCYTESUR NEGATIVE 10/25/2017 1520   LEUKOCYTESUR 6-10 (A) 08/19/2015 1436   Sepsis Labs Invalid input(s): PROCALCITONIN,  WBC,  LACTICIDVEN Microbiology No results found for this or any previous visit (from the past 240 hour(s)).   Time coordinating discharge: 35 minutes  SIGNED:   Rodena Goldmann, DO [facility] 10/27/2017, 12:11 PM Pager [phone]  If 7PM-7AM, please contact night-coverage [email or web address] Password [email or web address]

## 2017-10-27 NOTE — ED Provider Notes (Signed)
[facility] EMERGENCY DEPARTMENT Provider Note   CSN: [id number] Arrival date & time: 10/22/17  1616     History   Chief Complaint Chief Complaint  Patient presents with  . Emesis    HPI Theresa Reyes is a 62 y.o. female.  HPI   62 year old female with nausea and vomiting.  This is been ongoing problem for her for the past several years.  Worsening the past 4 days. Nonbilious/nonbloody emesis.  Mild diffuse abdominal pain.  No fevers or chills.  No urinary complaints.  Past Medical History:  Diagnosis Date  . ADD (attention deficit disorder)    adult  . Depression   . Liver mass    S/P resection and partial hepatioma 2017 at [facility]    Patient Active Problem List   Diagnosis Date Noted  . Chest pain 10/25/2017  . Hypertensive urgency 10/25/2017  . GERD (gastroesophageal reflux disease) 11/11/2016  . N&V (nausea and vomiting) 08/13/2016  . Hypokalemia 08/13/2016  . Menopausal sweats 09/05/2015  . Anxiety 08/08/2015  . Current smoker 07/29/2015  . Liver mass 07/11/2015  . Essential hypertension 09/28/2014  . Depression 03/23/2014  . ADHD (attention deficit hyperactivity disorder) 11/15/2012    Past Surgical History:  Procedure Laterality Date  . CHOLECYSTECTOMY    . ECTOPIC PREGNANCY SURGERY    . OPEN PARTIAL HEPATECTOMY   2017     OB History   None      Home Medications    Prior to Admission medications   Medication Sig Start Date End Date Taking? Authorizing Provider  escitalopram (LEXAPRO) 20 MG tablet TAKE 1 TABLET BY MOUTH EVERY DAY 08/30/17  Yes Hassell Done, Mary-Margaret, FNP  ondansetron (ZOFRAN-ODT) 4 MG disintegrating tablet Take 4 mg by mouth every 8 (eight) hours as needed for nausea or vomiting.   Yes [provider]  pantoprazole (PROTONIX) 40 MG tablet Take 1 tablet (40 mg total) by mouth daily. 12/21/16  Yes Dettinger, Fransisca Kaufmann, MD  promethazine (PHENERGAN) 25 MG tablet Take 1 tablet (25 mg total) by mouth every 6 (six) hours as  needed for nausea or vomiting. Patient not taking: Reported on 10/25/2017 11/11/16  Yes Dettinger, Fransisca Kaufmann, MD  lisinopril (PRINIVIL,ZESTRIL) 20 MG tablet Take 1 tablet (20 mg total) by mouth daily. 10/28/17   Manuella Ghazi, Pratik D, DO  promethazine (PHENERGAN) 25 MG suppository Place 1 suppository (25 mg total) rectally every 6 (six) hours as needed for nausea or vomiting. 10/22/17   Virgel Manifold, MD    Family History Family History  Problem Relation Age of Onset  . Hypertension Mother   . Colon cancer Father 66       Deceased age 14 from colon ca    Social History Social History   Tobacco Use  . Smoking status: Current Every Day Smoker    Packs/day: 1.00    Types: Cigarettes  . Smokeless tobacco: Never Used  Substance Use Topics  . Alcohol use: Yes    Comment: Occasional/socially  . Drug use: No     Allergies   Patient has no known allergies.   Review of Systems Review of Systems  All systems reviewed and negative, other than as noted in HPI.  Physical Exam Updated Vital Signs BP (!) 176/87   Pulse (!) 55   Temp 98.4 F (36.9 C) (Oral)   Resp 14   Wt 81.6 kg (180 lb)   SpO2 93%   BMI 29.05 kg/m    Physical Exam  Constitutional: She appears well-developed and  well-nourished. No distress.  HENT:  Head: Normocephalic and atraumatic.  Eyes: Conjunctivae are normal. Right eye exhibits no discharge. Left eye exhibits no discharge.  Neck: Neck supple.  Cardiovascular: Normal rate, regular rhythm and normal heart sounds. Exam reveals no gallop and no friction rub.  No murmur heard. Pulmonary/Chest: Effort normal and breath sounds normal. No respiratory distress.  Abdominal: Soft. She exhibits no distension. There is no tenderness.  Musculoskeletal: She exhibits no edema or tenderness.  Neurological: She is alert.  Skin: Skin is warm and dry.  Psychiatric: She has a normal mood and affect. Her behavior is normal. Thought content normal.  Nursing note and vitals  reviewed.    ED Treatments / Results  Labs (all labs ordered are listed, but only abnormal results are displayed) Labs Reviewed  COMPREHENSIVE METABOLIC PANEL - Abnormal; Notable for the following components:      Result Value   Glucose, Bld 159 (*)    Total Protein 8.3 (*)    All other components within normal limits  CBC - Abnormal; Notable for the following components:   WBC 15.7 (*)    All other components within normal limits  URINALYSIS, ROUTINE W REFLEX MICROSCOPIC - Abnormal; Notable for the following components:   Hgb urine dipstick LARGE (*)    Ketones, ur 20 (*)    Protein, ur 30 (*)    All other components within normal limits  LIPASE, BLOOD  TROPONIN I    EKG EKG Interpretation  Date/Time:  Friday Oct 22 2017 16:22:20 EDT Ventricular Rate:  62 PR Interval:    QRS Duration: 95 QT Interval:  639 QTC Calculation: 650 R Axis:   71 Text Interpretation:  Sinus rhythm Probable left atrial enlargement Abnormal R-wave progression, early transition Nonspecific repol abnormality, diffuse leads Confirmed by Virgel Manifold [phone]) on 10/22/2017 4:41:17 PM   Radiology Ct Angio Head W Or Wo Contrast  Result Date: 10/25/2017 CLINICAL DATA:  Headache, acute, normal neuro exam. Four-day history of chest pain. Hypertensive urgency. EXAM: CT ANGIOGRAPHY HEAD AND NECK TECHNIQUE: Multidetector CT imaging of the head and neck was performed using the standard protocol during bolus administration of intravenous contrast. Multiplanar CT image reconstructions and MIPs were obtained to evaluate the vascular anatomy. Carotid stenosis measurements (when applicable) are obtained utilizing NASCET criteria, using the distal internal carotid diameter as the denominator. CONTRAST:  127mL ISOVUE-370 IOPAMIDOL (ISOVUE-370) INJECTION 76% COMPARISON:  None. FINDINGS: CT HEAD FINDINGS Brain: Noncontrast imaging the brain is unremarkable. No acute infarct, hemorrhage, or mass lesion is present. The  ventricles are of normal size. No significant white matter disease is present. The brainstem and cerebellum are normal. No significant extra-axial fluid collection is present. Vascular: Faint atherosclerotic calcifications are present within the cavernous internal carotid arteries. There is no hyperdense vessel. Skull: Calvarium is intact. No focal lytic or blastic lesions are present. No significant extracranial soft tissue injury is present. Sinuses: The paranasal sinuses and mastoid air cells are clear. Orbits: Globes and orbits are within normal limits. Review of the MIP images confirms the above findings CTA NECK FINDINGS Aortic arch: A 3 vessel arch configuration is present. No significant atherosclerotic changes are present. There is no significant stenosis at the origins of the great vessels. Right carotid system: Mild proximal tortuosity is present in the cervical right ICA. The right carotid bifurcation demonstrates minimal atherosclerotic change without significant luminal stenosis. Mild tortuosity is present in the cervical right ICA. A focal beaded appearance in the mid cervical right ICA  suggests fibromuscular dysplasia. Left carotid system: The left common carotid artery is within normal limits. Bifurcation is unremarkable. There is some tortuosity of the cervical left ICA without significant stenosis. Minimal irregularity is present on the left, less so than on the right. Vertebral arteries: The vertebral arteries originate from the subclavian arteries bilaterally. There is no significant stenosis at the vertebral artery origins. Atherosclerotic calcifications are present along the wall of the right vertebral artery. There is tortuosity of the left vertebral artery particular. Vertebral arteries are otherwise codominant. No focal stenosis or vascular injury is evident in the neck. Skeleton: Degenerative anterolisthesis present at C4-5 and C6-7. Other neck: Bilateral subcentimeter lymph nodes are  present. No significant cervical adenopathy is present. Salivary glands are within normal limits. Vocal cords are midline and symmetric. The thyroid is within normal limits. Upper chest: Centrilobular emphysematous changes are present at the lung apices. No focal nodule, mass, or airspace disease is present. Review of the MIP images confirms the above findings CTA HEAD FINDINGS Anterior circulation: Atherosclerotic calcifications are present within the cavernous internal carotid arteries bilaterally. There is no significant luminal stenosis through the ICA terminus. A1 and M1 segments are normal. The anterior communicating artery is patent. MCA bifurcations are intact. There is moderate attenuation of MCA branch vessels without a significant proximal stenosis or occlusion. ACA branch vessels are within normal limits. Posterior circulation: The left vertebral artery is slightly dominant to the right. PICA origins are visualized and normal. The basilar artery is normal. Both posterior cerebral arteries originate from the basilar tip. There is moderate attenuation of distal PCA branch vessels without a significant proximal stenosis or occlusion. No aneurysms are present. Venous sinuses: The dural sinuses are patent. The straight sinus and deep cerebral veins are intact. Cortical veins are unremarkable. Anatomic variants: None Delayed phase: Postcontrast images demonstrate no pathologic enhancement. Review of the MIP images confirms the above findings IMPRESSION: 1. Atherosclerotic calcifications involving the cavernous internal carotid arteries bilaterally without focal stenosis. 2. Focal vessel irregularity in the mid cervical internal carotid arteries, right greater than left raises concern for fibromuscular dysplasia. 3. No other significant proximal stenosis, aneurysm, or branch vessel occlusion. 4. Mild degenerative changes of the cervical spine as described. 5.  Emphysema (ICD10-J43.9). Electronically Signed    By: San Morelle M.D.   On: 10/25/2017 14:16   Ct Angio Neck W And/or Wo Contrast  Result Date: 10/25/2017 CLINICAL DATA:  Headache, acute, normal neuro exam. Four-day history of chest pain. Hypertensive urgency. EXAM: CT ANGIOGRAPHY HEAD AND NECK TECHNIQUE: Multidetector CT imaging of the head and neck was performed using the standard protocol during bolus administration of intravenous contrast. Multiplanar CT image reconstructions and MIPs were obtained to evaluate the vascular anatomy. Carotid stenosis measurements (when applicable) are obtained utilizing NASCET criteria, using the distal internal carotid diameter as the denominator. CONTRAST:  130mL ISOVUE-370 IOPAMIDOL (ISOVUE-370) INJECTION 76% COMPARISON:  None. FINDINGS: CT HEAD FINDINGS Brain: Noncontrast imaging the brain is unremarkable. No acute infarct, hemorrhage, or mass lesion is present. The ventricles are of normal size. No significant white matter disease is present. The brainstem and cerebellum are normal. No significant extra-axial fluid collection is present. Vascular: Faint atherosclerotic calcifications are present within the cavernous internal carotid arteries. There is no hyperdense vessel. Skull: Calvarium is intact. No focal lytic or blastic lesions are present. No significant extracranial soft tissue injury is present. Sinuses: The paranasal sinuses and mastoid air cells are clear. Orbits: Globes and orbits are within normal limits.  Review of the MIP images confirms the above findings CTA NECK FINDINGS Aortic arch: A 3 vessel arch configuration is present. No significant atherosclerotic changes are present. There is no significant stenosis at the origins of the great vessels. Right carotid system: Mild proximal tortuosity is present in the cervical right ICA. The right carotid bifurcation demonstrates minimal atherosclerotic change without significant luminal stenosis. Mild tortuosity is present in the cervical right ICA. A  focal beaded appearance in the mid cervical right ICA suggests fibromuscular dysplasia. Left carotid system: The left common carotid artery is within normal limits. Bifurcation is unremarkable. There is some tortuosity of the cervical left ICA without significant stenosis. Minimal irregularity is present on the left, less so than on the right. Vertebral arteries: The vertebral arteries originate from the subclavian arteries bilaterally. There is no significant stenosis at the vertebral artery origins. Atherosclerotic calcifications are present along the wall of the right vertebral artery. There is tortuosity of the left vertebral artery particular. Vertebral arteries are otherwise codominant. No focal stenosis or vascular injury is evident in the neck. Skeleton: Degenerative anterolisthesis present at C4-5 and C6-7. Other neck: Bilateral subcentimeter lymph nodes are present. No significant cervical adenopathy is present. Salivary glands are within normal limits. Vocal cords are midline and symmetric. The thyroid is within normal limits. Upper chest: Centrilobular emphysematous changes are present at the lung apices. No focal nodule, mass, or airspace disease is present. Review of the MIP images confirms the above findings CTA HEAD FINDINGS Anterior circulation: Atherosclerotic calcifications are present within the cavernous internal carotid arteries bilaterally. There is no significant luminal stenosis through the ICA terminus. A1 and M1 segments are normal. The anterior communicating artery is patent. MCA bifurcations are intact. There is moderate attenuation of MCA branch vessels without a significant proximal stenosis or occlusion. ACA branch vessels are within normal limits. Posterior circulation: The left vertebral artery is slightly dominant to the right. PICA origins are visualized and normal. The basilar artery is normal. Both posterior cerebral arteries originate from the basilar tip. There is moderate  attenuation of distal PCA branch vessels without a significant proximal stenosis or occlusion. No aneurysms are present. Venous sinuses: The dural sinuses are patent. The straight sinus and deep cerebral veins are intact. Cortical veins are unremarkable. Anatomic variants: None Delayed phase: Postcontrast images demonstrate no pathologic enhancement. Review of the MIP images confirms the above findings IMPRESSION: 1. Atherosclerotic calcifications involving the cavernous internal carotid arteries bilaterally without focal stenosis. 2. Focal vessel irregularity in the mid cervical internal carotid arteries, right greater than left raises concern for fibromuscular dysplasia. 3. No other significant proximal stenosis, aneurysm, or branch vessel occlusion. 4. Mild degenerative changes of the cervical spine as described. 5.  Emphysema (ICD10-J43.9). Electronically Signed   By: San Morelle M.D.   On: 10/25/2017 14:16   Ct Angio Chest Aorta W And/or Wo Contrast  Result Date: 10/25/2017 CLINICAL DATA:  Chest pain, hypertension EXAM: CT ANGIOGRAPHY CHEST WITH CONTRAST TECHNIQUE: Multidetector CT imaging of the chest was performed using the standard protocol during bolus administration of intravenous contrast. Multiplanar CT image reconstructions and MIPs were obtained to evaluate the vascular anatomy. CONTRAST:  147mL ISOVUE-370 IOPAMIDOL (ISOVUE-370) INJECTION 76% COMPARISON:  None. FINDINGS: Cardiovascular: No evidence of aortic aneurysm or dissection. Heart is normal size. Mediastinum/Nodes: No mediastinal, hilar, or axillary adenopathy. Lungs/Pleura: Lungs are clear. No focal airspace opacities or suspicious nodules. No effusions. Upper Abdomen: Imaging into the upper abdomen shows no acute findings. Fatty infiltration of  the liver. Prior cholecystectomy. Musculoskeletal: Chest wall soft tissues are unremarkable. No acute bony abnormality. Review of the MIP images confirms the above findings. IMPRESSION: No  evidence of aortic aneurysm or dissection. No acute cardiopulmonary disease. Fatty infiltration of the liver. Electronically Signed   By: Rolm Baptise M.D.   On: 10/25/2017 14:04    Procedures Procedures (including critical care time)  Medications Ordered in ED Medications  lactated ringers bolus 1,000 mL (0 mLs Intravenous Stopped 10/22/17 1855)  ondansetron (ZOFRAN) injection 4 mg (4 mg Intravenous Given 10/22/17 1709)  promethazine (PHENERGAN) injection 12.5 mg (12.5 mg Intravenous Given 10/22/17 1845)  haloperidol lactate (HALDOL) injection 2 mg (2 mg Intravenous Given 10/22/17 2009)  ondansetron (ZOFRAN-ODT) disintegrating tablet 4 mg (4 mg Oral Given 10/22/17 2141)     Initial Impression / Assessment and Plan / ED Course  I have reviewed the triage vital signs and the nursing notes.  Pertinent labs & imaging results that were available during my care of the patient were reviewed by me and considered in my medical decision making (see chart for details).     62 year old female with nausea/vomiting.  Benign abdominal exam.  Symptomatically better.  Labs reviewed.  I doubt emergent process.  Return precautions discussed.  Final Clinical Impressions(s) / ED Diagnoses   Final diagnoses:  Nausea and vomiting, intractability of vomiting not specified, unspecified vomiting type    ED Discharge Orders        Ordered    promethazine (PHENERGAN) 25 MG suppository  Every 6 hours PRN     10/22/17 2058      Virgel Manifold, MD 10/27/17 1345

## 2018-03-02 ENCOUNTER — Observation Stay (HOSPITAL_COMMUNITY)
Admission: EM | Admit: 2018-03-02 | Discharge: 2018-03-05 | Disposition: A | Discharge status: Home / Self Care | Payer: Commercial Managed Care - PPO | Attending: Internal Medicine | Admitting: Internal Medicine

## 2018-03-02 ENCOUNTER — Emergency Department (HOSPITAL_COMMUNITY): Payer: Commercial Managed Care - PPO

## 2018-03-02 ENCOUNTER — Encounter (HOSPITAL_COMMUNITY): Payer: Self-pay | Admitting: Emergency Medicine

## 2018-03-02 ENCOUNTER — Other Ambulatory Visit: Payer: Self-pay

## 2018-03-02 DIAGNOSIS — F32A Depression, unspecified: Secondary | ICD-10-CM | POA: Diagnosis present

## 2018-03-02 DIAGNOSIS — F329 Major depressive disorder, single episode, unspecified: Secondary | ICD-10-CM | POA: Insufficient documentation

## 2018-03-02 DIAGNOSIS — F419 Anxiety disorder, unspecified: Secondary | ICD-10-CM | POA: Insufficient documentation

## 2018-03-02 DIAGNOSIS — Z9049 Acquired absence of other specified parts of digestive tract: Secondary | ICD-10-CM | POA: Insufficient documentation

## 2018-03-02 DIAGNOSIS — I10 Essential (primary) hypertension: Secondary | ICD-10-CM | POA: Diagnosis present

## 2018-03-02 DIAGNOSIS — R160 Hepatomegaly, not elsewhere classified: Secondary | ICD-10-CM | POA: Insufficient documentation

## 2018-03-02 DIAGNOSIS — I447 Left bundle-branch block, unspecified: Secondary | ICD-10-CM | POA: Diagnosis not present

## 2018-03-02 DIAGNOSIS — Z720 Tobacco use: Secondary | ICD-10-CM

## 2018-03-02 DIAGNOSIS — Z79899 Other long term (current) drug therapy: Secondary | ICD-10-CM | POA: Insufficient documentation

## 2018-03-02 DIAGNOSIS — I4510 Unspecified right bundle-branch block: Secondary | ICD-10-CM | POA: Diagnosis not present

## 2018-03-02 DIAGNOSIS — R1115 Cyclical vomiting syndrome unrelated to migraine: Secondary | ICD-10-CM

## 2018-03-02 DIAGNOSIS — F12288 Cannabis dependence with other cannabis-induced disorder: Secondary | ICD-10-CM

## 2018-03-02 DIAGNOSIS — E876 Hypokalemia: Secondary | ICD-10-CM | POA: Diagnosis present

## 2018-03-02 DIAGNOSIS — K219 Gastro-esophageal reflux disease without esophagitis: Secondary | ICD-10-CM | POA: Diagnosis present

## 2018-03-02 DIAGNOSIS — F17210 Nicotine dependence, cigarettes, uncomplicated: Secondary | ICD-10-CM | POA: Insufficient documentation

## 2018-03-02 DIAGNOSIS — F1220 Cannabis dependence, uncomplicated: Secondary | ICD-10-CM

## 2018-03-02 DIAGNOSIS — R112 Nausea with vomiting, unspecified: Secondary | ICD-10-CM | POA: Diagnosis present

## 2018-03-02 DIAGNOSIS — I160 Hypertensive urgency: Secondary | ICD-10-CM | POA: Diagnosis present

## 2018-03-02 DIAGNOSIS — I454 Nonspecific intraventricular block: Secondary | ICD-10-CM | POA: Diagnosis not present

## 2018-03-02 DIAGNOSIS — R1013 Epigastric pain: Secondary | ICD-10-CM | POA: Diagnosis not present

## 2018-03-02 DIAGNOSIS — F1210 Cannabis abuse, uncomplicated: Secondary | ICD-10-CM

## 2018-03-02 DIAGNOSIS — F12988 Cannabis use, unspecified with other cannabis-induced disorder: Secondary | ICD-10-CM | POA: Diagnosis not present

## 2018-03-02 LAB — TROPONIN I

## 2018-03-02 LAB — CBC WITH DIFFERENTIAL/PLATELET
Abs Immature Granulocytes: 0.05 10*3/uL (ref 0.00–0.07)
BASOS PCT: 0 %
Basophils Absolute: 0 10*3/uL (ref 0.0–0.1)
EOS ABS: 0 10*3/uL (ref 0.0–0.5)
EOS PCT: 0 %
HEMATOCRIT: 48.7 % — AB (ref 36.0–46.0)
Hemoglobin: 15.1 g/dL — ABNORMAL HIGH (ref 12.0–15.0)
Immature Granulocytes: 0 %
LYMPHS ABS: 1.2 10*3/uL (ref 0.7–4.0)
Lymphocytes Relative: 8 %
MCH: 28.5 pg (ref 26.0–34.0)
MCHC: 31 g/dL (ref 30.0–36.0)
MCV: 91.9 fL (ref 80.0–100.0)
MONO ABS: 0.6 10*3/uL (ref 0.1–1.0)
MONOS PCT: 4 %
Neutro Abs: 13.5 10*3/uL — ABNORMAL HIGH (ref 1.7–7.7)
Neutrophils Relative %: 88 %
PLATELETS: 216 10*3/uL (ref 150–400)
RBC: 5.3 MIL/uL — ABNORMAL HIGH (ref 3.87–5.11)
RDW: 13.9 % (ref 11.5–15.5)
WBC: 15.4 10*3/uL — ABNORMAL HIGH (ref 4.0–10.5)
nRBC: 0 % (ref 0.0–0.2)

## 2018-03-02 LAB — COMPREHENSIVE METABOLIC PANEL
ALT: 17 U/L (ref 0–44)
AST: 22 U/L (ref 15–41)
Albumin: 4.4 g/dL (ref 3.5–5.0)
Alkaline Phosphatase: 54 U/L (ref 38–126)
Anion gap: 12 (ref 5–15)
BILIRUBIN TOTAL: 0.6 mg/dL (ref 0.3–1.2)
BUN: 12 mg/dL (ref 8–23)
CHLORIDE: 105 mmol/L (ref 98–111)
CO2: 18 mmol/L — ABNORMAL LOW (ref 22–32)
Calcium: 8.9 mg/dL (ref 8.9–10.3)
Creatinine, Ser: 0.61 mg/dL (ref 0.44–1.00)
Glucose, Bld: 157 mg/dL — ABNORMAL HIGH (ref 70–99)
POTASSIUM: 3.3 mmol/L — AB (ref 3.5–5.1)
Sodium: 135 mmol/L (ref 135–145)
TOTAL PROTEIN: 7.9 g/dL (ref 6.5–8.1)

## 2018-03-02 LAB — LIPASE, BLOOD: LIPASE: 22 U/L (ref 11–51)

## 2018-03-02 MED ORDER — METOCLOPRAMIDE HCL 5 MG/ML IJ SOLN
10.0000 mg | Freq: Once | INTRAMUSCULAR | Status: AC
  Administered 2018-03-02: 10 mg via INTRAVENOUS
  Filled 2018-03-02: qty 2

## 2018-03-02 MED ORDER — GI COCKTAIL ~~LOC~~
30.0000 mL | Freq: Once | ORAL | Status: AC
  Administered 2018-03-02: 30 mL via ORAL
  Filled 2018-03-02: qty 30

## 2018-03-02 MED ORDER — SODIUM CHLORIDE 0.9 % IV SOLN: INTRAVENOUS | Status: DC

## 2018-03-02 MED ORDER — POTASSIUM CHLORIDE CRYS ER 20 MEQ PO TBCR
40.0000 meq | EXTENDED_RELEASE_TABLET | Freq: Once | ORAL | Status: AC
  Administered 2018-03-02: 40 meq via ORAL
  Filled 2018-03-02: qty 2

## 2018-03-02 MED ORDER — PROMETHAZINE HCL 25 MG/ML IJ SOLN
12.5000 mg | Freq: Once | INTRAMUSCULAR | Status: AC
  Administered 2018-03-02: 12.5 mg via INTRAVENOUS
  Filled 2018-03-02: qty 1

## 2018-03-02 MED ORDER — DIPHENHYDRAMINE HCL 50 MG/ML IJ SOLN
25.0000 mg | Freq: Once | INTRAMUSCULAR | Status: AC
  Administered 2018-03-02: 25 mg via INTRAVENOUS
  Filled 2018-03-02: qty 1

## 2018-03-02 MED ORDER — SODIUM CHLORIDE 0.9 % IV BOLUS
1000.0000 mL | Freq: Once | INTRAVENOUS | Status: AC
Start: 2018-03-02 — End: 2018-03-02
  Administered 2018-03-02: 1000 mL via INTRAVENOUS

## 2018-03-02 MED ORDER — METOPROLOL TARTRATE 5 MG/5ML IV SOLN
5.0000 mg | Freq: Once | INTRAVENOUS | Status: AC
  Administered 2018-03-03: 5 mg via INTRAVENOUS
  Filled 2018-03-02: qty 5

## 2018-03-02 MED ORDER — HYDRALAZINE HCL 20 MG/ML IJ SOLN
10.0000 mg | Freq: Once | INTRAMUSCULAR | Status: AC
  Administered 2018-03-02: 10 mg via INTRAVENOUS
  Filled 2018-03-02: qty 1

## 2018-03-02 MED ORDER — FAMOTIDINE IN NACL 20-0.9 MG/50ML-% IV SOLN
20.0000 mg | Freq: Once | INTRAVENOUS | Status: AC
  Administered 2018-03-03: 20 mg via INTRAVENOUS
  Filled 2018-03-02: qty 50

## 2018-03-02 MED ORDER — POTASSIUM CHLORIDE IN NACL 20-0.45 MEQ/L-% IV SOLN
INTRAVENOUS | Status: DC
Start: 2018-03-03 — End: 2018-03-03
  Administered 2018-03-03: 01:00:00 via INTRAVENOUS
  Filled 2018-03-02 (×8): qty 1000

## 2018-03-02 NOTE — ED Triage Notes (Signed)
Patient having upper abdominal pain with nausea and vomiting since 6 am today. Patient was given a 350 ml NS fluid bolus by EMS and 4 mg's of Zofran IV.

## 2018-03-02 NOTE — ED Provider Notes (Signed)
[facility] EMERGENCY DEPARTMENT Provider Note   CSN: [id number] Arrival date & time: 03/02/18  2003     History   Chief Complaint Chief Complaint  Patient presents with  . Abdominal Pain    Nausea vomiting    HPI Theresa Reyes is a 62 y.o. female.  HPI Resents with nausea and vomiting starting this morning.  States she is vomited greater than 10 times.  No blood in vomit.  Complains of upper abdominal pain similar to previous episodes.  She is had 3 loose bowel movements but no melanotic or grossly bloody stool.  Has had subjective fevers and chills.  No urinary symptoms.  Was given IV fluids and Zofran in route by EMS. Past Medical History:  Diagnosis Date  . ADD (attention deficit disorder)    adult  . Depression   . Liver mass    S/P resection and partial hepatioma 2017 at [facility]    Patient Active Problem List   Diagnosis Date Noted  . Cyclic vomiting syndrome 03/03/2018  . Tobacco abuse 03/03/2018  . Marijuana abuse 03/03/2018  . Intractable nausea and vomiting 03/02/2018  . Chest pain 10/25/2017  . Hypertensive urgency 10/25/2017  . GERD (gastroesophageal reflux disease) 11/11/2016  . N&V (nausea and vomiting) 08/13/2016  . Hypokalemia 08/13/2016  . Menopausal sweats 09/05/2015  . Anxiety 08/08/2015  . Current smoker 07/29/2015  . Liver mass 07/11/2015  . Essential hypertension 09/28/2014  . Depression 03/23/2014  . ADHD (attention deficit hyperactivity disorder) 11/15/2012    Past Surgical History:  Procedure Laterality Date  . CHOLECYSTECTOMY    . ECTOPIC PREGNANCY SURGERY    . OPEN PARTIAL HEPATECTOMY   2017     OB History   None      Home Medications    Prior to Admission medications   Medication Sig Start Date End Date Taking? Authorizing Provider  escitalopram (LEXAPRO) 20 MG tablet TAKE 1 TABLET BY MOUTH EVERY DAY 08/30/17  Yes Hassell Done, Mary-Margaret, FNP  ondansetron (ZOFRAN-ODT) 4 MG disintegrating tablet Take 4 mg by mouth every 8  (eight) hours as needed for nausea or vomiting.   Yes [provider]  lisinopril (PRINIVIL,ZESTRIL) 20 MG tablet Take 1 tablet (20 mg total) by mouth daily. Patient not taking: Reported on 03/03/2018 10/28/17   Heath Lark D, DO  pantoprazole (PROTONIX) 40 MG tablet Take 1 tablet (40 mg total) by mouth daily. Patient not taking: Reported on 03/03/2018 12/21/16   Dettinger, Fransisca Kaufmann, MD  promethazine (PHENERGAN) 25 MG suppository Place 1 suppository (25 mg total) rectally every 6 (six) hours as needed for nausea or vomiting. Patient not taking: Reported on 03/03/2018 10/22/17   Virgel Manifold, MD  promethazine (PHENERGAN) 25 MG tablet Take 1 tablet (25 mg total) by mouth every 6 (six) hours as needed for nausea or vomiting. Patient not taking: Reported on 03/03/2018 11/11/16   Dettinger, Fransisca Kaufmann, MD    Family History Family History  Problem Relation Age of Onset  . Hypertension Mother   . Colon cancer Father 65       Deceased age 67 from colon ca    Social History Social History   Tobacco Use  . Smoking status: Current Every Day Smoker    Packs/day: 1.00    Types: Cigarettes  . Smokeless tobacco: Never Used  Substance Use Topics  . Alcohol use: Yes    Comment: Occasional/socially  . Drug use: No     Allergies   Patient has no known allergies.  Review of Systems Review of Systems  Constitutional: Positive for chills, fatigue and fever.  HENT: Negative for sore throat and trouble swallowing.   Respiratory: Negative for chest tightness and shortness of breath.   Cardiovascular: Negative for chest pain, palpitations and leg swelling.  Gastrointestinal: Positive for abdominal pain, diarrhea, nausea and vomiting. Negative for blood in stool and constipation.  Genitourinary: Negative for dysuria, flank pain, frequency, hematuria and pelvic pain.  Musculoskeletal: Negative for back pain, joint swelling, myalgias and neck pain.  Skin: Negative for rash and wound.    Neurological: Negative for dizziness, weakness, light-headedness, numbness and headaches.  All other systems reviewed and are negative.    Physical Exam Updated Vital Signs BP (!) 191/82 (BP Location: Left Arm)   Pulse (!) 59   Temp 99.2 F (37.3 C) (Oral)   Resp 18   Ht 5\' 6"  (1.676 m)   Wt 81.6 kg   SpO2 98%   BMI 29.04 kg/m   Physical Exam  Constitutional: She is oriented to person, place, and time. She appears well-developed and well-nourished.  Mildly drowsy appearing  HENT:  Head: Normocephalic and atraumatic.  Mouth/Throat: Oropharynx is clear and moist. No oropharyngeal exudate.  Eyes: Pupils are equal, round, and reactive to light. EOM are normal.  Neck: Normal range of motion. Neck supple. No JVD present.  Cardiovascular: Normal rate and regular rhythm. Exam reveals no gallop and no friction rub.  No murmur heard. Pulmonary/Chest: Effort normal and breath sounds normal.  Abdominal: Soft. Bowel sounds are normal. There is tenderness. There is no rebound and no guarding.  Very mild upper abdominal tenderness to palpation without rebound or guarding.  Musculoskeletal: Normal range of motion. She exhibits no edema or tenderness.  No CVA tenderness bilaterally.  Lymphadenopathy:    She has no cervical adenopathy.  Neurological: She is alert and oriented to person, place, and time.  Moves all extremities without focal deficit.  Sensation intact.  Skin: Skin is warm and dry. Capillary refill takes less than 2 seconds. No rash noted. No erythema.  Psychiatric: She has a normal mood and affect. Her behavior is normal.  Nursing note and vitals reviewed.    ED Treatments / Results  Labs (all labs ordered are listed, but only abnormal results are displayed) Labs Reviewed  CBC WITH DIFFERENTIAL/PLATELET - Abnormal; Notable for the following components:      Result Value   WBC 15.4 (*)    RBC 5.30 (*)    Hemoglobin 15.1 (*)    HCT 48.7 (*)    Neutro Abs 13.5 (*)     All other components within normal limits  COMPREHENSIVE METABOLIC PANEL - Abnormal; Notable for the following components:   Potassium 3.3 (*)    CO2 18 (*)    Glucose, Bld 157 (*)    All other components within normal limits  URINALYSIS, ROUTINE W REFLEX MICROSCOPIC - Abnormal; Notable for the following components:   APPearance HAZY (*)    Glucose, UA 50 (*)    Hgb urine dipstick MODERATE (*)    Ketones, ur 20 (*)    Protein, ur 100 (*)    All other components within normal limits  RAPID URINE DRUG SCREEN, HOSP PERFORMED - Abnormal; Notable for the following components:   Tetrahydrocannabinol POSITIVE (*)    All other components within normal limits  COMPREHENSIVE METABOLIC PANEL - Abnormal; Notable for the following components:   CO2 21 (*)    Glucose, Bld 139 (*)  All other components within normal limits  CBC WITH DIFFERENTIAL/PLATELET - Abnormal; Notable for the following components:   WBC 17.6 (*)    Neutro Abs 15.3 (*)    Abs Immature Granulocytes 0.10 (*)    All other components within normal limits  MRSA PCR SCREENING  LIPASE, BLOOD  TROPONIN I    EKG EKG Interpretation  Date/Time:  Wednesday March 02 2018 20:50:48 EDT Ventricular Rate:  62 PR Interval:    QRS Duration: 139 QT Interval:  462 QTC Calculation: 470 R Axis:   82 Text Interpretation:  Sinus rhythm Right bundle branch block When compared with ECG of 08/13/2016 No significant change was found Confirmed by Francine Graven [phone]) on 03/03/2018 12:03:50 AM   Radiology Dg Abd Acute W/chest  Result Date: 03/02/2018 CLINICAL DATA:  Upper abdominal pain with nausea and vomiting since 6 a.m. EXAM: DG ABDOMEN ACUTE W/ 1V CHEST COMPARISON:  10/25/2017 FINDINGS: Heart size is normal. Aortic atherosclerosis with slight uncoiling of the aorta is identified without aneurysm. Lungs are clear without pulmonary consolidation, effusion or pneumothorax. No acute osseous abnormality. AP supine and upright views of  the abdomen demonstrate no free air beneath the diaphragm. Nonspecific nonobstructed bowel gas pattern with moderate stool retention the ascending and descending colon. Scattered colonic diverticulosis is noted along the descending. Small phleboliths are seen in pelvis. The patient is status post cholecystectomy. There is lower lumbar facet arthropathy. IMPRESSION: Nonobstructed, nondistended bowel gas pattern.  Clear lungs. Electronically Signed   By: Ashley Royalty M.D.   On: 03/02/2018 23:26    Procedures Procedures (including critical care time)  Medications Ordered in ED Medications  heparin injection 5,000 Units (5,000 Units Subcutaneous Given 03/03/18 1345)  acetaminophen (TYLENOL) tablet 650 mg (has no administration in time range)    Or  acetaminophen (TYLENOL) suppository 650 mg (has no administration in time range)  ondansetron (ZOFRAN) tablet 4 mg ( Oral See Alternative 03/03/18 0130)    Or  ondansetron (ZOFRAN) injection 4 mg (4 mg Intravenous Given 03/03/18 0130)  escitalopram (LEXAPRO) tablet 20 mg (20 mg Oral Given 03/03/18 1014)  0.9 % NaCl with KCl 20 mEq/ L  infusion ( Intravenous Rate/Dose Verify 03/03/18 0900)  ondansetron (ZOFRAN) injection 4 mg (4 mg Intravenous Given 03/03/18 1543)  hydrALAZINE (APRESOLINE) injection 10 mg (10 mg Intravenous Given 03/03/18 1402)  promethazine (PHENERGAN) injection 12.5 mg (has no administration in time range)  pantoprazole (PROTONIX) EC tablet 40 mg (40 mg Oral Given 03/03/18 1014)  amLODipine (NORVASC) tablet 5 mg (5 mg Oral Given 03/03/18 1538)  sodium chloride 0.9 % bolus 1,000 mL (0 mLs Intravenous Stopped 03/02/18 2229)  metoCLOPramide (REGLAN) injection 10 mg (10 mg Intravenous Given 03/02/18 2043)  gi cocktail (Maalox,Lidocaine,Donnatal) (30 mLs Oral Given 03/02/18 2058)  hydrALAZINE (APRESOLINE) injection 10 mg (10 mg Intravenous Given 03/02/18 2239)  potassium chloride SA (K-DUR,KLOR-CON) CR tablet 40 mEq (40 mEq Oral Given 03/02/18  2305)  promethazine (PHENERGAN) injection 12.5 mg (12.5 mg Intravenous Given 03/02/18 2352)  diphenhydrAMINE (BENADRYL) injection 25 mg (25 mg Intravenous Given 03/02/18 2353)  famotidine (PEPCID) IVPB 20 mg premix (0 mg Intravenous Stopped 03/03/18 0034)  metoprolol tartrate (LOPRESSOR) injection 5 mg (5 mg Intravenous Given 03/03/18 0021)  hydrALAZINE (APRESOLINE) injection 20 mg (20 mg Intravenous Given 03/03/18 0337)     Initial Impression / Assessment and Plan / ED Course  I have reviewed the triage vital signs and the nursing notes.  Pertinent labs & imaging results that were available during  my care of the patient were reviewed by me and considered in my medical decision making (see chart for details).     Signed out to Dr. Thurnell Garbe pending laboratory work-up and reassessment  Final Clinical Impressions(s) / ED Diagnoses   Final diagnoses:  Intractable nausea and vomiting  Epigastric pain  Hypokalemia    ED Discharge Orders    None       Julianne Rice, MD 03/03/18 1617

## 2018-03-02 NOTE — H&P (Signed)
History and Physical    Theresa Reyes ALP:[id number] DOB: [birth year] DOA: 03/02/2018  PCP: Chevis Pretty, FNP   Patient coming from: Home.  I have personally briefly reviewed patient's old medical records in [place]  Chief Complaint: Abdominal pain, nausea and vomiting.  HPI: Theresa Reyes is a 62 y.o. female with medical history significant of attention deficit disorder of adult, depression, liver mass, history of resection of partial hepatoma who is coming to the emergency department with complaints of progressively worse abdominal pain associated with nausea and multiple episodes of emesis since 0600 today.  She was given 4 mg of Zofran IVP and 350 mL's of NS bolus by EMS.  She has had 3 mildly loose BM but denies hematemesis, hematochezia or melena.  She occasionally gets constipated.  No dysuria, frequency or hematuria.  She denies fever, but complains of chills and feels fatigued.  She complains of burning chest pain from vomiting.  She has had palpitations on occasion.   No headache, sore throat, dyspnea, diaphoresis, PND, orthopnea or recent pitting edema of the lower extremities.  No heat or cold intolerance.  Denies polyuria, polydipsia, polyphagia or blurred vision.  Denies skin pruritus.  ED Course: Initial vital signs temperature 98.7 F, pulse 71, respirations 18, blood pressure 169/104 mmHg and O2 sat 100% on room air.  The patient received at 1000 mL NS bolus, metoclopramide 10 mg IVP x1, Phenergan 12.5 mg IVP x1, hydralazine 10 mg IVP x1, famotidine 20 mg IVPB and if in either me 25 mg IVP in the emergency department without significant relief of symptoms.  Her urinalysis shows a hazy appearance, mild glucosuria of 50, ketones of 20 and protein of 100 mg/dL.  There was moderate hemoglobinuria with 21-50 RBC per hpf.  Her urine toxicology was positive for THC metabolite. Her white count is 15.4, hemoglobin 15.1 g/dL and platelets 216.  CMP shows a potassium of 3.3,  CO2 of 18 mmol/L.  Glucose was 157 mg/dL.  All other CMP values are within normal limits.  EKG shows sinus rhythm with RBBB, which shows no changes.  Troponin was normal.  Imaging: Abdominal and chest radiographs did not show any acute intra-abdominal or cardiopulmonary pathology.  Please see images and full radiology report for further detail.  Review of Systems: As per HPI otherwise 10 point review of systems negative.   Past Medical History:  Diagnosis Date  . ADD (attention deficit disorder)    adult  . Depression   . Liver mass    S/P resection and partial hepatioma 2017 at [facility]    Past Surgical History:  Procedure Laterality Date  . CHOLECYSTECTOMY    . ECTOPIC PREGNANCY SURGERY    . OPEN PARTIAL HEPATECTOMY   2017     reports that she has been smoking cigarettes. She has been smoking about 1.00 pack per day. She has never used smokeless tobacco. She reports that she drinks alcohol. She reports that she does not use drugs.  No Known Allergies  Family History  Problem Relation Age of Onset  . Hypertension Mother   . Colon cancer Father 43       Deceased age 3 from colon ca    Prior to Admission medications   Medication Sig Start Date End Date Taking? Authorizing Provider  escitalopram (LEXAPRO) 20 MG tablet TAKE 1 TABLET BY MOUTH EVERY DAY 08/30/17   Hassell Done, Mary-Margaret, FNP  lisinopril (PRINIVIL,ZESTRIL) 20 MG tablet Take 1 tablet (20 mg total) by mouth daily. 10/28/17  Manuella Ghazi, Pratik D, DO  ondansetron (ZOFRAN-ODT) 4 MG disintegrating tablet Take 4 mg by mouth every 8 (eight) hours as needed for nausea or vomiting.    [provider]  pantoprazole (PROTONIX) 40 MG tablet Take 1 tablet (40 mg total) by mouth daily. 12/21/16   Dettinger, Fransisca Kaufmann, MD  promethazine (PHENERGAN) 25 MG suppository Place 1 suppository (25 mg total) rectally every 6 (six) hours as needed for nausea or vomiting. 10/22/17   Virgel Manifold, MD  promethazine (PHENERGAN) 25 MG tablet Take 1  tablet (25 mg total) by mouth every 6 (six) hours as needed for nausea or vomiting. Patient not taking: Reported on 10/25/2017 11/11/16   Dettinger, Fransisca Kaufmann, MD    Physical Exam: Vitals:   03/02/18 2130 03/02/18 2239 03/02/18 2309 03/02/18 2330  BP: (!) 215/78 (!) 205/94 (!) 174/73 (!) 185/85  Pulse: 65  64 78  Resp: 20  11 19   Temp:      TempSrc:      SpO2: 98%  99% 100%  Weight:      Height:        Constitutional: NAD, calm, comfortable Eyes: PERRL, lids and conjunctivae normal ENMT: Mucous membranes are mildly dry. Posterior pharynx clear of any exudate or lesions. Neck: normal, supple, no masses, no thyromegaly Respiratory: Clear to auscultation bilaterally, no wheezing, no crackles. Normal respiratory effort. No accessory muscle use.  Cardiovascular: Regular rate and rhythm, no murmurs / rubs / gallops. No extremity edema. 2+ pedal pulses. No carotid bruits.  Abdomen: Nondistended.  Bowel sounds positive.  Soft, positive epigastric and RUQ tenderness, no masses palpated. No hepatosplenomegaly.  Musculoskeletal: no clubbing / cyanosis. Good ROM, no contractures. Normal muscle tone.  Skin: Hyperpigmented macules on upper dorsal and extremities area. Neurologic: CN 2-12 grossly intact. Sensation intact, DTR normal. Strength 5/5 in all 4.  Psychiatric: Normal judgment and insight. Alert and oriented x 3. Normal mood.   Labs on Admission: I have personally reviewed following labs and imaging studies  CBC: Recent Labs  Lab 03/02/18 2117  WBC 15.4*  NEUTROABS 13.5*  HGB 15.1*  HCT 48.7*  MCV 91.9  PLT 659   Basic Metabolic Panel: Recent Labs  Lab 03/02/18 2117  NA 135  K 3.3*  CL 105  CO2 18*  GLUCOSE 157*  BUN 12  CREATININE 0.61  CALCIUM 8.9   GFR: Estimated Creatinine Clearance: 78.7 mL/min (by C-G formula based on SCr of 0.61 mg/dL). Liver Function Tests: Recent Labs  Lab 03/02/18 2117  AST 22  ALT 17  ALKPHOS 54  BILITOT 0.6  PROT 7.9  ALBUMIN 4.4    Recent Labs  Lab 03/02/18 2117  LIPASE 22   No results for input(s): AMMONIA in the last 168 hours. Coagulation Profile: No results for input(s): INR, PROTIME in the last 168 hours. Cardiac Enzymes: Recent Labs  Lab 03/02/18 2235  TROPONINI <0.03   BNP (last 3 results) No results for input(s): PROBNP in the last 8760 hours. HbA1C: No results for input(s): HGBA1C in the last 72 hours. CBG: No results for input(s): GLUCAP in the last 168 hours. Lipid Profile: No results for input(s): CHOL, HDL, LDLCALC, TRIG, CHOLHDL, LDLDIRECT in the last 72 hours. Thyroid Function Tests: No results for input(s): TSH, T4TOTAL, FREET4, T3FREE, THYROIDAB in the last 72 hours. Anemia Panel: No results for input(s): VITAMINB12, FOLATE, FERRITIN, TIBC, IRON, RETICCTPCT in the last 72 hours. Urine analysis:    Component Value Date/Time   COLORURINE COLORLESS (A) 10/25/2017 1520  APPEARANCEUR CLEAR 10/25/2017 1520   APPEARANCEUR Clear 08/19/2015 1436   LABSPEC >1.046 (H) 10/25/2017 1520   PHURINE 7.0 10/25/2017 1520   GLUCOSEU NEGATIVE 10/25/2017 1520   HGBUR SMALL (A) 10/25/2017 1520   BILIRUBINUR NEGATIVE 10/25/2017 1520   BILIRUBINUR Negative 08/19/2015 1436   KETONESUR NEGATIVE 10/25/2017 1520   PROTEINUR NEGATIVE 10/25/2017 1520   UROBILINOGEN 0.2 07/04/2013 1125   NITRITE NEGATIVE 10/25/2017 1520   LEUKOCYTESUR NEGATIVE 10/25/2017 1520   LEUKOCYTESUR 6-10 (A) 08/19/2015 1436    Radiological Exams on Admission: Dg Abd Acute W/chest  Result Date: 03/02/2018 CLINICAL DATA:  Upper abdominal pain with nausea and vomiting since 6 a.m. EXAM: DG ABDOMEN ACUTE W/ 1V CHEST COMPARISON:  10/25/2017 FINDINGS: Heart size is normal. Aortic atherosclerosis with slight uncoiling of the aorta is identified without aneurysm. Lungs are clear without pulmonary consolidation, effusion or pneumothorax. No acute osseous abnormality. AP supine and upright views of the abdomen demonstrate no free air  beneath the diaphragm. Nonspecific nonobstructed bowel gas pattern with moderate stool retention the ascending and descending colon. Scattered colonic diverticulosis is noted along the descending. Small phleboliths are seen in pelvis. The patient is status post cholecystectomy. There is lower lumbar facet arthropathy. IMPRESSION: Nonobstructed, nondistended bowel gas pattern.  Clear lungs. Electronically Signed   By: Ashley Royalty M.D.   On: 03/02/2018 23:26    EKG: Independently reviewed. Vent. rate 62 BPM PR interval * ms QRS duration 139 ms QT/QTc 462/470 ms P-R-T axes 56 82 38 Sinus rhythm Right bundle branch block No significant change when compared to previous.  Assessment/Plan Principal Problem:   Intractable nausea and vomiting Observation/telemetry. Keep n.p.o. Continue IV fluids. Analgesics as needed. Antiemetics as needed. Consider CT abdomen/pelvis if no improvement. Consider GI evaluation if symptoms persist. Otherwise, even if symptoms are better, the patient is to see GI as an outpatient.  Active Problems:   Hypertensive urgency   Essential hypertension Continue hydralazine 50 mg IVP every 4 hours as needed. Continue pain control. Resume lisinopril 20 mg p.o. daily. Monitor blood pressure, renal function electrolytes.    Depression Continue Lexapro 20 mg p.o. daily.    Current smoker Nicotine replacement therapy ordered. Staff to provide smoking cessation information.    Hypokalemia Replacing. Follow-up potassium level.    GERD (gastroesophageal reflux disease) On famotidine IV now. Resume Protonix once tolerating oral intake.    DVT prophylaxis: SCDs. Code Status: Full code. Family Communication:  Disposition Plan: Observation for IV hydration and symptoms treatment. Consults called: Admission status: Observation/telemetry.   Reubin Milan MD [facility] Pager [phone].  If 7PM-7AM, please contact  night-coverage [email or web address] Password [email or web address]  03/02/2018, 11:53 PM

## 2018-03-02 NOTE — ED Notes (Signed)
Patient given ice chips, blankets and a pillow.

## 2018-03-03 DIAGNOSIS — R112 Nausea with vomiting, unspecified: Secondary | ICD-10-CM | POA: Diagnosis not present

## 2018-03-03 DIAGNOSIS — F1210 Cannabis abuse, uncomplicated: Secondary | ICD-10-CM

## 2018-03-03 DIAGNOSIS — Z720 Tobacco use: Secondary | ICD-10-CM

## 2018-03-03 DIAGNOSIS — E876 Hypokalemia: Secondary | ICD-10-CM | POA: Diagnosis not present

## 2018-03-03 DIAGNOSIS — R1115 Cyclical vomiting syndrome unrelated to migraine: Secondary | ICD-10-CM | POA: Diagnosis not present

## 2018-03-03 DIAGNOSIS — I10 Essential (primary) hypertension: Secondary | ICD-10-CM | POA: Diagnosis not present

## 2018-03-03 LAB — COMPREHENSIVE METABOLIC PANEL
ALBUMIN: 4.1 g/dL (ref 3.5–5.0)
ALT: 17 U/L (ref 0–44)
AST: 19 U/L (ref 15–41)
Alkaline Phosphatase: 53 U/L (ref 38–126)
Anion gap: 10 (ref 5–15)
BUN: 10 mg/dL (ref 8–23)
CALCIUM: 9.1 mg/dL (ref 8.9–10.3)
CO2: 21 mmol/L — AB (ref 22–32)
CREATININE: 0.63 mg/dL (ref 0.44–1.00)
Chloride: 105 mmol/L (ref 98–111)
GFR calc Af Amer: >60 mL/min (ref >60)
GFR calc non Af Amer: >60 mL/min (ref >60)
GLUCOSE: 139 mg/dL — AB (ref 70–99)
Potassium: 3.9 mmol/L (ref 3.5–5.1)
SODIUM: 136 mmol/L (ref 135–145)
Total Bilirubin: 0.7 mg/dL (ref 0.3–1.2)
Total Protein: 7.7 g/dL (ref 6.5–8.1)

## 2018-03-03 LAB — CBC WITH DIFFERENTIAL/PLATELET
Abs Immature Granulocytes: 0.1 10*3/uL — ABNORMAL HIGH (ref 0.00–0.07)
Basophils Absolute: 0 10*3/uL (ref 0.0–0.1)
Basophils Relative: 0 %
EOS ABS: 0 10*3/uL (ref 0.0–0.5)
EOS PCT: 0 %
HEMATOCRIT: 45.3 % (ref 36.0–46.0)
Hemoglobin: 14.5 g/dL (ref 12.0–15.0)
IMMATURE GRANULOCYTES: 1 %
LYMPHS ABS: 1.4 10*3/uL (ref 0.7–4.0)
Lymphocytes Relative: 8 %
MCH: 28.5 pg (ref 26.0–34.0)
MCHC: 32 g/dL (ref 30.0–36.0)
MCV: 89.2 fL (ref 80.0–100.0)
MONOS PCT: 5 %
Monocytes Absolute: 0.8 10*3/uL (ref 0.1–1.0)
NEUTROS PCT: 86 %
Neutro Abs: 15.3 10*3/uL — ABNORMAL HIGH (ref 1.7–7.7)
Platelets: 296 10*3/uL (ref 150–400)
RBC: 5.08 MIL/uL (ref 3.87–5.11)
RDW: 14.2 % (ref 11.5–15.5)
WBC: 17.6 10*3/uL — ABNORMAL HIGH (ref 4.0–10.5)
nRBC: 0 % (ref 0.0–0.2)

## 2018-03-03 LAB — URINALYSIS, ROUTINE W REFLEX MICROSCOPIC
Bacteria, UA: NONE SEEN
Bilirubin Urine: NEGATIVE
GLUCOSE, UA: 50 mg/dL — AB
Ketones, ur: 20 mg/dL — AB
Leukocytes, UA: NEGATIVE
NITRITE: NEGATIVE
Protein, ur: 100 mg/dL — AB
Specific Gravity, Urine: 1.017 (ref 1.005–1.030)
pH: 8 (ref 5.0–8.0)

## 2018-03-03 LAB — RAPID URINE DRUG SCREEN, HOSP PERFORMED
AMPHETAMINES: NOT DETECTED
Barbiturates: NOT DETECTED
Benzodiazepines: NOT DETECTED
Cocaine: NOT DETECTED
Opiates: NOT DETECTED
TETRAHYDROCANNABINOL: POSITIVE — AB

## 2018-03-03 LAB — MRSA PCR SCREENING: MRSA BY PCR: NEGATIVE

## 2018-03-03 MED ORDER — ACETAMINOPHEN 325 MG PO TABS: 650.0000 mg | ORAL_TABLET | Freq: Four times a day (QID) | ORAL | Status: DC | PRN

## 2018-03-03 MED ORDER — ESCITALOPRAM OXALATE 10 MG PO TABS
20.0000 mg | ORAL_TABLET | Freq: Every day | ORAL | Status: DC
  Administered 2018-03-03 – 2018-03-05 (×3): 20 mg via ORAL
  Filled 2018-03-03 (×3): qty 2

## 2018-03-03 MED ORDER — LISINOPRIL 10 MG PO TABS: 20.0000 mg | ORAL_TABLET | Freq: Every day | ORAL | Status: DC

## 2018-03-03 MED ORDER — HYDRALAZINE HCL 20 MG/ML IJ SOLN: 15.0000 mg | INTRAMUSCULAR | Status: DC | PRN

## 2018-03-03 MED ORDER — HYDROMORPHONE HCL 1 MG/ML IJ SOLN: 1.0000 mg | INTRAMUSCULAR | Status: DC | PRN

## 2018-03-03 MED ORDER — HEPARIN SODIUM (PORCINE) 5000 UNIT/ML IJ SOLN
5000.0000 [IU] | Freq: Three times a day (TID) | INTRAMUSCULAR | Status: DC
  Administered 2018-03-03 – 2018-03-05 (×7): 5000 [IU] via SUBCUTANEOUS
  Filled 2018-03-03 (×5): qty 1

## 2018-03-03 MED ORDER — ONDANSETRON HCL 4 MG/2ML IJ SOLN
4.0000 mg | Freq: Four times a day (QID) | INTRAMUSCULAR | Status: DC | PRN
  Administered 2018-03-03: 4 mg via INTRAVENOUS
  Filled 2018-03-03: qty 2

## 2018-03-03 MED ORDER — ONDANSETRON HCL 4 MG/2ML IJ SOLN
4.0000 mg | Freq: Four times a day (QID) | INTRAMUSCULAR | Status: AC
  Administered 2018-03-03 – 2018-03-04 (×3): 4 mg via INTRAVENOUS
  Filled 2018-03-03 (×4): qty 2

## 2018-03-03 MED ORDER — HYDRALAZINE HCL 20 MG/ML IJ SOLN
20.0000 mg | Freq: Once | INTRAMUSCULAR | Status: AC
  Administered 2018-03-03: 20 mg via INTRAVENOUS
  Filled 2018-03-03: qty 1

## 2018-03-03 MED ORDER — HYDRALAZINE HCL 20 MG/ML IJ SOLN
10.0000 mg | Freq: Four times a day (QID) | INTRAMUSCULAR | Status: DC | PRN
  Administered 2018-03-03 – 2018-03-05 (×4): 10 mg via INTRAVENOUS
  Filled 2018-03-03 (×4): qty 1

## 2018-03-03 MED ORDER — PANTOPRAZOLE SODIUM 40 MG PO TBEC
40.0000 mg | DELAYED_RELEASE_TABLET | Freq: Every day | ORAL | Status: DC
  Administered 2018-03-03 – 2018-03-05 (×3): 40 mg via ORAL
  Filled 2018-03-03 (×3): qty 1

## 2018-03-03 MED ORDER — PROMETHAZINE HCL 25 MG/ML IJ SOLN: 12.5000 mg | Freq: Four times a day (QID) | INTRAMUSCULAR | Status: DC | PRN

## 2018-03-03 MED ORDER — ONDANSETRON HCL 4 MG PO TABS: 4.0000 mg | ORAL_TABLET | Freq: Four times a day (QID) | ORAL | Status: DC | PRN

## 2018-03-03 MED ORDER — ACETAMINOPHEN 650 MG RE SUPP: 650.0000 mg | Freq: Four times a day (QID) | RECTAL | Status: DC | PRN

## 2018-03-03 MED ORDER — AMLODIPINE BESYLATE 5 MG PO TABS
5.0000 mg | ORAL_TABLET | Freq: Every day | ORAL | Status: DC
  Administered 2018-03-03 – 2018-03-04 (×2): 5 mg via ORAL
  Filled 2018-03-03 (×2): qty 1

## 2018-03-03 MED ORDER — POTASSIUM CHLORIDE IN NACL 20-0.9 MEQ/L-% IV SOLN
INTRAVENOUS | Status: DC
  Administered 2018-03-03 – 2018-03-04 (×4): via INTRAVENOUS
  Administered 2018-03-05: 100 mL/h via INTRAVENOUS

## 2018-03-03 NOTE — ED Provider Notes (Signed)
Pt received at sign out with labs, AXR pending. Pt has also received IV zofran and reglan for vomiting without improvement. Pt continues to c/o nausea and has vomited after receiving both meds. IV phenergan, benadryl and pepcid ordered. H/H hemeconcentrated; IVF gtt ordered. Pt denies hx of HTN "when I'm not vomiting like this."  BP slowly decreasing. EKG and troponin reassuring. Attempted to give potassium PO; pt c/o increasing nausea. Dx and testing d/w pt and family.  Questions answered.  Verb understanding, agreeable to admit. 2350:  T/C returned from [facility] Dr. Olevia Bowens, case discussed, including:  HPI, pertinent PM/SHx, VS/PE, dx testing, ED course and treatment:  Agreeable to admit.     Patient Vitals for the past 24 hrs:  BP Temp Temp src Pulse Resp SpO2 Height Weight  03/02/18 2330 (!) 185/85 - - 78 19 100 % - -  03/02/18 2309 (!) 174/73 - - 64 11 99 % - -  03/02/18 2239 (!) 205/94 - - - - - - -  03/02/18 2130 (!) 215/78 - - 65 20 98 % - -  03/02/18 2100 (!) 205/94 - - 64 15 100 % - -  03/02/18 2030 (!) 216/97 - - 64 18 99 % - -  03/02/18 2020 (!) 169/104 - - 61 - 99 % - -  03/02/18 2018 (!) 169/104 98.7 F (37.1 C) Oral 71 18 100 % - -  03/02/18 2012 - - - - - - 5\' 6"  (1.676 m) 79.8 kg  03/02/18 2011 - - - - - 100 % - Francine Graven, DO 03/03/18 0008

## 2018-03-03 NOTE — Progress Notes (Signed)
PROGRESS NOTE  Nabiha Planck DVV:[id number] DOB: 1956/01/15 DOA: 03/02/2018 PCP: Chevis Pretty, FNP  Brief History:  62 year old female with a history of tobacco abuse, essential hypertension, ADD, depression presenting with intractable nausea, vomiting, abdominal pain that began on the morning of 03/02/2018.  Patient had been in her usual state of health prior to 03/02/2018 without any complaints.  She had denied any fevers, chills, headaches, hematochezia, melena, dysuria, hematuria shortness of breath.  The patient did recently travel to [place] a little over 1 week prior to this admission.  She did drink alcohol.  The patient continues to smoke cannabis on a daily basis which she has done for nearly 30 years.  She denies any illicit drug use.  She denies any NSAIDs or other over-the-counter supplements.  She denies any sick contacts.  The patient developed some chest pain as a result of vomiting which has since resolved.  Patient continued to have nausea and vomiting despite Reglan, Phenergan, and Pepcid in the emergency department.  As result, the patient was admitted for further evaluation.  Upon presentation, the patient was noted to have WBC 15.4, hemoglobin 15.1.  Urinalysis was negative for pyuria.  Urine drug screen showed positive cannabis.  Acute abdominal series was nonobstructive with nonspecific bowel gas pattern.  Assessment/Plan: Cannabis induced cyclic vomiting syndrome -Start Zofran around-the-clock -Continue IV fluids -03/02/2017 acute abdominal series nonobstructive, nonspecific bowel gas pattern -Cessation of cannabis discussed -Clear liquids for now -start PPI  Essential hypertension -Patient states that she has never taken lisinopril -Suspect patient will need to be started on antihypertensive therapy -Monitor clinically for now -Hydralazine as needed SBP greater than 180  Tobacco abuse -I have discussed tobacco cessation with the patient.  I have  counseled the patient regarding the negative impacts of continued tobacco use including but not limited to lung cancer, COPD, and cardiovascular disease.  I have discussed alternatives to tobacco and modalities that may help facilitate tobacco cessation including but not limited to biofeedback, hypnosis, and medications.  Total time spent with tobacco counseling was 4 minutes.  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  She denies fevers, chills, chest pain, shortness breath, hematochezia, melena, dysuria, hematuria.  There is no coughing or hemoptysis.  Objective: Vitals:   03/03/18 0337 03/03/18 0400 03/03/18 0500 03/03/18 0600  BP: (!) 185/67     Pulse:  63 70 63  Resp:  (!) 21 (!) 23 19  Temp:  99 F (37.2 C)    TempSrc:  Oral    SpO2:  97% 97% 97%  Weight:   81.6 kg   Height:        Intake/Output Summary (Last 24 hours) at 03/03/2018 0748 Last data filed at 03/03/2018 0300 Gross per 24 hour  Intake 1353.84 ml  Output -  Net 1353.84 ml   Weight change:  Exam:   General:  Pt is alert, follows commands appropriately, not in acute  distress  HEENT: No icterus, No thrush, No neck mass, [place]/AT  Cardiovascular: RRR, S1/S2, no rubs, no gallops  Respiratory:  bibasilar crackles with diminished sounds bilateral.  Abdomen: Soft/+BS, non tender, non distended, no guarding  Extremities: No edema, No  lymphangitis, No petechiae, No rashes, no synovitis   Data Reviewed: I have personally reviewed following labs and imaging studies Basic Metabolic Panel: Recent Labs  Lab 03/02/18 2117 03/03/18 0357  NA 135 136  K 3.3* 3.9  CL 105 105  CO2 18* 21*  GLUCOSE 157* 139*  BUN 12 10  CREATININE 0.61 0.63  CALCIUM 8.9 9.1   Liver Function Tests: Recent Labs  Lab 03/02/18 2117 03/03/18 0357  AST 22 19  ALT 17 17  ALKPHOS 54 53  BILITOT 0.6 0.7  PROT 7.9 7.7  ALBUMIN 4.4 4.1   Recent Labs  Lab 03/02/18 2117  LIPASE 22   No results for input(s): AMMONIA in the last 168 hours. Coagulation Profile: No results for input(s): INR, PROTIME in the last 168 hours. CBC: Recent Labs  Lab 03/02/18 2117 03/03/18 0357  WBC 15.4* 17.6*  NEUTROABS 13.5* 15.3*  HGB 15.1* 14.5  HCT 48.7* 45.3  MCV 91.9 89.2  PLT 216 296   Cardiac Enzymes: Recent Labs  Lab 03/02/18 2235  TROPONINI <0.03   BNP: Invalid input(s): POCBNP CBG: No results for input(s): GLUCAP in the last 168 hours. HbA1C: No results for input(s): HGBA1C in the last 72 hours. Urine analysis:    Component Value Date/Time   COLORURINE YELLOW 03/02/2018 2353   APPEARANCEUR HAZY (A) 03/02/2018 2353   APPEARANCEUR Clear 08/19/2015 1436   LABSPEC 1.017 03/02/2018 2353   PHURINE 8.0 03/02/2018 2353   GLUCOSEU 50 (A) 03/02/2018 2353   HGBUR MODERATE (A) 03/02/2018 2353   BILIRUBINUR NEGATIVE 03/02/2018 2353   BILIRUBINUR Negative 08/19/2015 1436   KETONESUR 20 (A) 03/02/2018 2353   PROTEINUR 100 (A) 03/02/2018 2353   UROBILINOGEN 0.2 07/04/2013 1125   NITRITE NEGATIVE 03/02/2018 2353   LEUKOCYTESUR NEGATIVE 03/02/2018 2353   LEUKOCYTESUR 6-10 (A) 08/19/2015 1436   Sepsis Labs: @LABRCNTIP (procalcitonin:4,lacticidven:4) )No results found for this or any previous visit (from the past 240 hour(s)).   Scheduled Meds: . escitalopram  20 mg Oral Daily  . heparin  5,000 Units Subcutaneous Q8H  . ondansetron  (ZOFRAN) IV  4 mg Intravenous Q6H   Continuous Infusions: . 0.9 % NaCl with KCl 20 mEq / L      Procedures/Studies: Dg Abd Acute W/chest  Result Date: 03/02/2018 CLINICAL DATA:  Upper abdominal pain with nausea and vomiting since 6 a.m. EXAM: DG ABDOMEN ACUTE W/ 1V CHEST COMPARISON:  10/25/2017 FINDINGS: Heart size is normal. Aortic atherosclerosis with slight uncoiling of the aorta is identified without aneurysm. Lungs are clear without pulmonary consolidation, effusion or pneumothorax. No acute osseous abnormality. AP supine and upright views of the abdomen demonstrate no free air beneath the diaphragm. Nonspecific nonobstructed bowel gas pattern with moderate stool retention the ascending and descending colon. Scattered colonic diverticulosis is noted along the descending. Small phleboliths are seen in pelvis. The patient is status post cholecystectomy. There is lower lumbar facet arthropathy. IMPRESSION: Nonobstructed, nondistended bowel gas pattern.  Clear lungs. Electronically Signed   By: Ashley Royalty M.D.   On: 03/02/2018 23:26    Orson Eva, DO  [facility] Pager [phone]  If 7PM-7AM, please contact night-coverage [email or web address] Password [email or web address] 03/03/2018, 7:48 AM   LOS: 0 days

## 2018-03-03 NOTE — ED Notes (Signed)
Waiting on medication from [facility]

## 2018-03-04 ENCOUNTER — Observation Stay (HOSPITAL_COMMUNITY): Payer: Commercial Managed Care - PPO

## 2018-03-04 DIAGNOSIS — I160 Hypertensive urgency: Secondary | ICD-10-CM

## 2018-03-04 DIAGNOSIS — F12288 Cannabis dependence with other cannabis-induced disorder: Secondary | ICD-10-CM | POA: Diagnosis not present

## 2018-03-04 DIAGNOSIS — F17200 Nicotine dependence, unspecified, uncomplicated: Secondary | ICD-10-CM | POA: Diagnosis not present

## 2018-03-04 DIAGNOSIS — R1110 Vomiting, unspecified: Secondary | ICD-10-CM | POA: Diagnosis not present

## 2018-03-04 DIAGNOSIS — R1115 Cyclical vomiting syndrome unrelated to migraine: Secondary | ICD-10-CM | POA: Diagnosis not present

## 2018-03-04 DIAGNOSIS — R112 Nausea with vomiting, unspecified: Secondary | ICD-10-CM | POA: Diagnosis not present

## 2018-03-04 LAB — CBC
HCT: 47.5 % — ABNORMAL HIGH (ref 36.0–46.0)
HEMOGLOBIN: 15.4 g/dL — AB (ref 12.0–15.0)
MCH: 29.1 pg (ref 26.0–34.0)
MCHC: 32.4 g/dL (ref 30.0–36.0)
MCV: 89.8 fL (ref 80.0–100.0)
Platelets: 319 10*3/uL (ref 150–400)
RBC: 5.29 MIL/uL — AB (ref 3.87–5.11)
RDW: 14.4 % (ref 11.5–15.5)
WBC: 16.7 10*3/uL — AB (ref 4.0–10.5)
nRBC: 0 % (ref 0.0–0.2)

## 2018-03-04 LAB — BASIC METABOLIC PANEL
ANION GAP: 9 (ref 5–15)
BUN: 10 mg/dL (ref 8–23)
CO2: 22 mmol/L (ref 22–32)
Calcium: 9 mg/dL (ref 8.9–10.3)
Chloride: 103 mmol/L (ref 98–111)
Creatinine, Ser: 0.65 mg/dL (ref 0.44–1.00)
GFR calc Af Amer: >60 mL/min (ref >60)
Glucose, Bld: 123 mg/dL — ABNORMAL HIGH (ref 70–99)
POTASSIUM: 3.6 mmol/L (ref 3.5–5.1)
SODIUM: 134 mmol/L — AB (ref 135–145)

## 2018-03-04 MED ORDER — ALUM & MAG HYDROXIDE-SIMETH 200-200-20 MG/5ML PO SUSP
30.0000 mL | ORAL | Status: DC | PRN
  Administered 2018-03-04: 30 mL via ORAL
  Filled 2018-03-04: qty 30

## 2018-03-04 MED ORDER — IOPAMIDOL (ISOVUE-300) INJECTION 61%
100.0000 mL | Freq: Once | INTRAVENOUS | Status: AC | PRN
  Administered 2018-03-04: 100 mL via INTRAVENOUS

## 2018-03-04 MED ORDER — LOSARTAN POTASSIUM 50 MG PO TABS
50.0000 mg | ORAL_TABLET | Freq: Every day | ORAL | Status: DC
  Administered 2018-03-04: 50 mg via ORAL
  Filled 2018-03-04: qty 1

## 2018-03-04 MED ORDER — ONDANSETRON HCL 4 MG/2ML IJ SOLN
4.0000 mg | Freq: Four times a day (QID) | INTRAMUSCULAR | Status: DC
  Administered 2018-03-04 – 2018-03-05 (×5): 4 mg via INTRAVENOUS
  Filled 2018-03-04 (×5): qty 2

## 2018-03-04 MED ORDER — AMLODIPINE BESYLATE 5 MG PO TABS
5.0000 mg | ORAL_TABLET | Freq: Once | ORAL | Status: AC
  Administered 2018-03-04: 5 mg via ORAL
  Filled 2018-03-04: qty 1

## 2018-03-04 MED ORDER — IOPAMIDOL (ISOVUE-300) INJECTION 61%
30.0000 mL | Freq: Once | INTRAVENOUS | Status: AC | PRN
  Administered 2018-03-04: 30 mL via ORAL

## 2018-03-04 MED ORDER — AMLODIPINE BESYLATE 5 MG PO TABS
10.0000 mg | ORAL_TABLET | Freq: Every day | ORAL | Status: DC
  Administered 2018-03-05: 10 mg via ORAL
  Filled 2018-03-04: qty 2

## 2018-03-04 NOTE — Progress Notes (Signed)
PROGRESS NOTE  Gricel Copen DJM:[id number] DOB: [birth year] DOA: 03/02/2018 PCP: Chevis Pretty, FNP  Brief History:  62 year old female with a history of tobacco abuse, essential hypertension, ADD, depression presenting with intractable nausea, vomiting, abdominal pain that began on the morning of 03/02/2018.  Patient had been in her usual state of health prior to 03/02/2018 without any complaints.  She had denied any fevers, chills, headaches, hematochezia, melena, dysuria, hematuria shortness of breath.  The patient did recently travel to [place] a little over 1 week prior to this admission.  She did drink alcohol.  The patient continues to smoke cannabis on a daily basis which she has done for nearly 30 years.  She denies any illicit drug use.  She denies any NSAIDs or other over-the-counter supplements.  She denies any sick contacts.  The patient developed some chest pain as a result of vomiting which has since resolved.  Patient continued to have nausea and vomiting despite Reglan, Phenergan, and Pepcid in the emergency department.  As result, the patient was admitted for further evaluation.  Upon presentation, the patient was noted to have WBC 15.4, hemoglobin 15.1.  Urinalysis was negative for pyuria.  Urine drug screen showed positive cannabis.  Acute abdominal series was nonobstructive with nonspecific bowel gas pattern.  Assessment/Plan: Cannabis induced hyperemesis syndrome -Continue Zofran around-the-clock -she continues to have emesis, now with abd pain -CT abd/pelvis -Continue IV fluids -03/02/2017 acute abdominal series nonobstructive, nonspecific bowel gas pattern -Cessation of cannabis discussed -continue liquids for now -start PPI  Essential hypertension -Patient states that she has never taken lisinopril -Suspect patient will need to be started on antihypertensive therapy -increase amlodipine -start losartan 50 mg daily -Hydralazine as needed SBP  greater than 180  Tobacco abuse -I have discussed tobacco cessation with the patient.  I have counseled the patient regarding the negative impacts of continued tobacco use including but not limited to lung cancer, COPD, and cardiovascular disease.  I have discussed alternatives to tobacco and modalities that may help facilitate tobacco cessation including but not limited to biofeedback, hypnosis, and medications.  Total time spent with tobacco counseling was 4 minutes.  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  Denies cp, sob, f/c.  C/o epigastric abd pain.  No headache, dysuria, hematuria  Objective: Vitals:   03/03/18 2100 03/04/18 0500 03/04/18 0745 03/04/18 0956  BP: (!) 175/71 (!) 184/82 (!) 200/97 (!) 184/85  Pulse: 62 (!) 55    Resp: 18 18    Temp: 99.5 F (37.5 C) 99.1 F (37.3 C)    TempSrc: Oral Oral    SpO2: 98% 97%    Weight:      Height:        Intake/Output Summary (Last 24 hours) at 03/04/2018 1025 Last data filed at 03/04/2018 1000 Gross per 24 hour  Intake 2479.83 ml  Output -  Net 2479.83 ml   Weight change:  Exam:   General:  Pt is alert, follows commands appropriately, not in acute distress  HEENT: No icterus, No thrush, No neck mass, [place]/AT  Cardiovascular: RRR, S1/S2, no rubs, no gallops  Respiratory: diminished BS bilateral, bibasilar rales  Abdomen: Soft/+BS, epigastric tender, non  distended, no guarding  Extremities: No edema, No lymphangitis, No petechiae, No rashes, no synovitis   Data Reviewed: I have personally reviewed following labs and imaging studies Basic Metabolic Panel: Recent Labs  Lab 03/02/18 2117 03/03/18 0357 03/04/18 0400  NA 135 136 134*  K 3.3* 3.9 3.6  CL 105 105 103  CO2 18* 21* 22  GLUCOSE 157* 139* 123*  BUN 12 10 10   CREATININE 0.61 0.63 0.65  CALCIUM 8.9 9.1 9.0   Liver Function Tests: Recent Labs  Lab 03/02/18 2117 03/03/18 0357  AST 22 19  ALT 17 17  ALKPHOS 54 53  BILITOT 0.6 0.7  PROT 7.9 7.7  ALBUMIN 4.4 4.1   Recent Labs  Lab 03/02/18 2117  LIPASE 22   No results for input(s): AMMONIA in the last 168 hours. Coagulation Profile: No results for input(s): INR, PROTIME in the last 168 hours. CBC: Recent Labs  Lab 03/02/18 2117 03/03/18 0357 03/04/18 0400  WBC 15.4* 17.6* 16.7*  NEUTROABS 13.5* 15.3*  --   HGB 15.1* 14.5 15.4*  HCT 48.7* 45.3 47.5*  MCV 91.9 89.2 89.8  PLT 216 296 319   Cardiac Enzymes: Recent Labs  Lab 03/02/18 2235  TROPONINI <0.03   BNP: Invalid input(s): POCBNP CBG: No results for input(s): GLUCAP in the last 168 hours. HbA1C: No results for input(s): HGBA1C in the last 72 hours. Urine analysis:    Component Value Date/Time   COLORURINE YELLOW 03/02/2018 2353   APPEARANCEUR HAZY (A) 03/02/2018 2353   APPEARANCEUR Clear 08/19/2015 1436   LABSPEC 1.017 03/02/2018 2353   PHURINE 8.0 03/02/2018 2353   GLUCOSEU 50 (A) 03/02/2018 2353   HGBUR MODERATE (A) 03/02/2018 2353   BILIRUBINUR NEGATIVE 03/02/2018 2353   BILIRUBINUR Negative 08/19/2015 1436   KETONESUR 20 (A) 03/02/2018 2353   PROTEINUR 100 (A) 03/02/2018 2353   UROBILINOGEN 0.2 07/04/2013 1125   NITRITE NEGATIVE 03/02/2018 2353   LEUKOCYTESUR NEGATIVE 03/02/2018 2353   LEUKOCYTESUR 6-10 (A) 08/19/2015 1436   Sepsis Labs: @LABRCNTIP (procalcitonin:4,lacticidven:4) ) Recent Results (from the past 240 hour(s))    MRSA PCR Screening     Status: None   Collection Time: 03/03/18  1:02 AM  Result Value Ref Range Status   MRSA by PCR NEGATIVE NEGATIVE Final    Comment:        The GeneXpert MRSA Assay (FDA approved for NASAL specimens only), is one component of a comprehensive MRSA colonization surveillance program. It is not intended to diagnose MRSA infection nor to guide or monitor treatment for MRSA infections. Performed at [facility], [address], [place], [place] [address]      Scheduled Meds: . [START ON 03/05/2018] amLODipine  10 mg Oral Daily  . escitalopram  20 mg Oral Daily  . heparin  5,000 Units Subcutaneous Q8H  . losartan  50 mg Oral Daily  . pantoprazole  40 mg Oral Daily   Continuous Infusions: . 0.9 % NaCl with KCl 20 mEq / L 100 mL/hr at 03/04/18 1000    Procedures/Studies: Dg Abd Acute W/chest  Result Date: 03/02/2018 CLINICAL DATA:  Upper abdominal pain with nausea and vomiting since 6 a.m. EXAM: DG ABDOMEN ACUTE W/ 1V CHEST COMPARISON:  10/25/2017 FINDINGS: Heart size is normal. Aortic atherosclerosis  with slight uncoiling of the aorta is identified without aneurysm. Lungs are clear without pulmonary consolidation, effusion or pneumothorax. No acute osseous abnormality. AP supine and upright views of the abdomen demonstrate no free air beneath the diaphragm. Nonspecific nonobstructed bowel gas pattern with moderate stool retention the ascending and descending colon. Scattered colonic diverticulosis is noted along the descending. Small phleboliths are seen in pelvis. The patient is status post cholecystectomy. There is lower lumbar facet arthropathy. IMPRESSION: Nonobstructed, nondistended bowel gas pattern.  Clear lungs. Electronically Signed   By: Ashley Royalty M.D.   On: 03/02/2018 23:26    Orson Eva, DO  [facility] Pager [phone]  If 7PM-7AM, please contact night-coverage [email or web address] Password [email or web address] 03/04/2018, 10:25 AM   LOS: 0 days

## 2018-03-05 DIAGNOSIS — R1115 Cyclical vomiting syndrome unrelated to migraine: Secondary | ICD-10-CM | POA: Diagnosis not present

## 2018-03-05 DIAGNOSIS — R112 Nausea with vomiting, unspecified: Secondary | ICD-10-CM | POA: Diagnosis not present

## 2018-03-05 DIAGNOSIS — F12288 Cannabis dependence with other cannabis-induced disorder: Secondary | ICD-10-CM | POA: Diagnosis not present

## 2018-03-05 DIAGNOSIS — F17200 Nicotine dependence, unspecified, uncomplicated: Secondary | ICD-10-CM | POA: Diagnosis not present

## 2018-03-05 LAB — CBC
HEMATOCRIT: 46.4 % — AB (ref 36.0–46.0)
Hemoglobin: 15.1 g/dL — ABNORMAL HIGH (ref 12.0–15.0)
MCH: 28.8 pg (ref 26.0–34.0)
MCHC: 32.5 g/dL (ref 30.0–36.0)
MCV: 88.4 fL (ref 80.0–100.0)
Platelets: 293 10*3/uL (ref 150–400)
RBC: 5.25 MIL/uL — ABNORMAL HIGH (ref 3.87–5.11)
RDW: 14.4 % (ref 11.5–15.5)
WBC: 11.7 10*3/uL — ABNORMAL HIGH (ref 4.0–10.5)
nRBC: 0 % (ref 0.0–0.2)

## 2018-03-05 LAB — BASIC METABOLIC PANEL WITH GFR
Anion gap: 9 (ref 5–15)
BUN: 9 mg/dL (ref 8–23)
CO2: 22 mmol/L (ref 22–32)
Calcium: 8.8 mg/dL — ABNORMAL LOW (ref 8.9–10.3)
Chloride: 106 mmol/L (ref 98–111)
Creatinine, Ser: 0.66 mg/dL (ref 0.44–1.00)
GFR calc Af Amer: >60 mL/min (ref >60)
GFR calc non Af Amer: >60 mL/min (ref >60)
Glucose, Bld: 117 mg/dL — ABNORMAL HIGH (ref 70–99)
Potassium: 3.4 mmol/L — ABNORMAL LOW (ref 3.5–5.1)
Sodium: 137 mmol/L (ref 135–145)

## 2018-03-05 LAB — MAGNESIUM: Magnesium: 2.3 mg/dL (ref 1.7–2.4)

## 2018-03-05 MED ORDER — ONDANSETRON 8 MG PO TBDP: 8.0000 mg | ORAL_TABLET | Freq: Three times a day (TID) | ORAL | 0 refills | Status: DC | PRN

## 2018-03-05 MED ORDER — METOPROLOL TARTRATE 25 MG PO TABS: 12.5000 mg | ORAL_TABLET | Freq: Two times a day (BID) | ORAL | 1 refills | Status: DC

## 2018-03-05 MED ORDER — LOSARTAN POTASSIUM 100 MG PO TABS: 100.0000 mg | ORAL_TABLET | Freq: Every day | ORAL | 1 refills | Status: DC

## 2018-03-05 MED ORDER — POTASSIUM CHLORIDE CRYS ER 20 MEQ PO TBCR
20.0000 meq | EXTENDED_RELEASE_TABLET | Freq: Once | ORAL | Status: AC
  Administered 2018-03-05: 20 meq via ORAL
  Filled 2018-03-05: qty 1

## 2018-03-05 MED ORDER — AMLODIPINE BESYLATE 10 MG PO TABS: 10.0000 mg | ORAL_TABLET | Freq: Every day | ORAL | 1 refills | Status: DC

## 2018-03-05 MED ORDER — METOPROLOL TARTRATE 25 MG PO TABS
12.5000 mg | ORAL_TABLET | Freq: Two times a day (BID) | ORAL | Status: DC
  Administered 2018-03-05: 12.5 mg via ORAL
  Filled 2018-03-05: qty 1

## 2018-03-05 MED ORDER — PANTOPRAZOLE SODIUM 40 MG PO TBEC: 40.0000 mg | DELAYED_RELEASE_TABLET | Freq: Every day | ORAL | 2 refills | Status: DC

## 2018-03-05 MED ORDER — LOSARTAN POTASSIUM 50 MG PO TABS
100.0000 mg | ORAL_TABLET | Freq: Every day | ORAL | Status: DC
  Administered 2018-03-05: 100 mg via ORAL
  Filled 2018-03-05: qty 2

## 2018-03-05 NOTE — Progress Notes (Signed)
Pt discharged home. PIV removed; no complications. Instructions given including medications and follow-up appointments. All questions answered. Understanding verbalized. Pt taken out with all belongings via [place] by RN.

## 2018-03-05 NOTE — Discharge Summary (Signed)
Physician Discharge Summary  Theresa Reyes IRJ:[id number] DOB: [birth year] DOA: 03/02/2018  PCP: Chevis Pretty, FNP  Admit date: 03/02/2018 Discharge date: 03/05/2018  Admitted From: Home Disposition:  Home   Recommendations for Outpatient Follow-up:  1. Follow up with PCP in 1-2 weeks 2. Please obtain BMP/CBC in one week   Discharge Condition: Stable CODE STATUS: FULL Diet recommendation: Heart Healthy   Brief/Interim Summary: 62 year old female with a history of tobacco abuse, essential hypertension, ADD, depression presenting with intractable nausea, vomiting, abdominal pain that began on the morning of 03/02/2018. Patient had been in her usual state of health prior to 03/02/2018 without any complaints. She had denied any fevers, chills, headaches, hematochezia, melena, dysuria, hematuria shortness of breath.The patient did recently travel to [place] a little over 1 week prior to this admission. She did drink alcohol. The patient continues to smoke cannabis on a daily basis which she has done for nearly 30 years. She denies any illicit drug use. She denies any NSAIDs or other over-the-counter supplements. She denies any sick contacts. The patient developed some chest pain as a result of vomiting which has since resolved. Patient continued to have nausea and vomiting despite Reglan, Phenergan, and Pepcid in the emergency department. As result, the patient was admitted for further evaluation. Upon presentation, the patient was noted to have WBC 15.4, hemoglobin 15.1. Urinalysis was negative for pyuria. Urine drug screen showed positive cannabis. Acute abdominal series was nonobstructive with nonspecific bowel gas pattern.  Discharge Diagnoses:  Cannabis induced hyperemesis syndrome -Continue Zofran around-the-clock -she continues to have emesis, now with abd pain -03/04/18--CT abd/pelvis--neg for acute findings; neg obstruction, neg bowel wall  thickening -Continued IV fluids -03/02/2017 acute abdominal series nonobstructive, nonspecific bowel gas pattern -Cessation of cannabis discussed -diet gradually advanced to soft diet which pt ultimately tolerated -continue PPI  Essential hypertension -Patient states that she has never taken lisinopril -Suspect patient will need to be started on antihypertensive therapy -increase amlodipine 10 mg daily -increase losartan 100 mg daily -add metoprolol tartrate 12.5 mg bid -Hydralazine as needed SBP greater than 180  Tobacco abuse -I have discussed tobacco cessation with the patient. I have counseled the patient regarding the negative impacts of continued tobacco use including but not limited to lung cancer, COPD, and cardiovascular disease. I have discussed alternatives to tobacco and modalities that may help facilitate tobacco cessation including but not limited to biofeedback, hypnosis, and medications. Total time spent with tobacco counseling was 4 minutes.  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   escitalopram 20 MG tablet Commonly known as:  LEXAPRO TAKE 1 TABLET BY MOUTH EVERY DAY   losartan 100 MG tablet Commonly known as:  COZAAR Take 1 tablet (100 mg total) by mouth daily.    metoprolol tartrate 25 MG tablet Commonly known as:  LOPRESSOR Take 0.5 tablets (12.5 mg total)  by mouth 2 (two) times daily.   ondansetron 4 MG disintegrating tablet Commonly known as:  ZOFRAN-ODT Take 4 mg by mouth every 8 (eight) hours as needed for nausea or vomiting.   pantoprazole 40 MG tablet Commonly known as:  PROTONIX Take 1 tablet (40 mg total) by mouth daily.       No Known Allergies  Consultations:  none   Procedures/Studies: Ct Abdomen Pelvis W Contrast  Result Date: 03/04/2018 CLINICAL DATA:  Vomiting with upper abdominal pain EXAM: CT ABDOMEN AND PELVIS WITH CONTRAST TECHNIQUE: Multidetector CT imaging of the abdomen and pelvis was performed using the standard protocol following bolus administration of intravenous contrast. CONTRAST:  64mL ISOVUE-300 IOPAMIDOL (ISOVUE-300) INJECTION 61%, 171mL ISOVUE-300 IOPAMIDOL (ISOVUE-300) INJECTION 61% COMPARISON:  08/10/2016 FINDINGS: Lower chest: Unremarkable Hepatobiliary: No focal abnormality within the liver parenchyma. Gallbladder surgically absent. Common bile duct measures up to 7 mm diameter, stable. Pancreas: No focal mass lesion. No dilatation of the main duct. No intraparenchymal cyst. No peripancreatic edema. Spleen: No splenomegaly. No focal mass lesion. Adrenals/Urinary Tract: Right adrenal gland unremarkable. Irregular thickening of the left adrenal gland is stable. Kidneys unremarkable. Bilateral perinephric stranding is stable. No evidence for hydroureter. The urinary bladder appears normal for the degree of distention. Stomach/Bowel: Stomach is nondistended. No gastric wall thickening. No evidence of outlet obstruction. Duodenum is normally positioned as is the ligament of Treitz. No small bowel wall thickening. No small bowel dilatation. The terminal ileum is normal. The appendix is normal. Diverticular changes are noted in the left colon without evidence of diverticulitis. Vascular/Lymphatic: There is abdominal  aortic atherosclerosis without aneurysm. There is no gastrohepatic or hepatoduodenal ligament lymphadenopathy. No intraperitoneal or retroperitoneal lymphadenopathy. No pelvic sidewall lymphadenopathy. Reproductive: The uterus has normal CT imaging appearance. There is no adnexal mass. Other: No intraperitoneal free fluid. Musculoskeletal: No worrisome lytic or sclerotic osseous abnormality. IMPRESSION: 1. Stable exam.  No acute findings in the abdomen or pelvis. 2. Cholecystectomy with stable mild biliary prominence, likely related to the prior surgery. 3.  Aortic Atherosclerois (ICD10-170.0) Electronically Signed   By: Misty Stanley M.D.   On: 03/04/2018 13:25   Dg Abd Acute W/chest  Result Date: 03/02/2018 CLINICAL DATA:  Upper abdominal pain with nausea and vomiting since 6 a.m. EXAM: DG ABDOMEN ACUTE W/ 1V CHEST COMPARISON:  10/25/2017 FINDINGS: Heart size is normal. Aortic atherosclerosis with slight uncoiling of the aorta is identified without aneurysm. Lungs are clear without pulmonary consolidation, effusion or pneumothorax. No acute osseous abnormality. AP supine and upright views of the abdomen demonstrate no free air beneath the diaphragm. Nonspecific nonobstructed bowel gas pattern with moderate stool retention the ascending and descending colon. Scattered colonic diverticulosis is noted along the descending. Small phleboliths are seen in pelvis. The patient is status post cholecystectomy. There is lower lumbar facet arthropathy. IMPRESSION: Nonobstructed, nondistended bowel gas pattern.  Clear lungs. Electronically Signed   By: Ashley Royalty M.D.   On: 03/02/2018 23:26        Discharge Exam: Vitals:   03/05/18 0645 03/05/18 0800  BP: (!) 175/77   Pulse:    Resp:    Temp:  98.5 F (36.9 C)  SpO2:     Vitals:   03/05/18 0537 03/05/18 0620 03/05/18 0645 03/05/18 0800  BP: (!) 189/80 (!) 161/74 (!) 175/77   Pulse:      Resp:      Temp: 98.1 F (36.7 C)   98.5 F (36.9 C)   TempSrc:    Oral  SpO2:      Weight:      Height:        General: Pt is alert, awake, not in acute distress Cardiovascular: RRR, S1/S2 +, no rubs, no gallops Respiratory: CTA bilaterally, no wheezing, no rhonchi Abdominal: Soft, NT, ND, bowel sounds + Extremities: no edema, no cyanosis   The results of significant diagnostics from this hospitalization (including imaging, microbiology, ancillary and laboratory) are listed below for reference.    Significant Diagnostic Studies: Ct Abdomen Pelvis W Contrast  Result Date: 03/04/2018 CLINICAL DATA:  Vomiting with upper abdominal pain EXAM: CT ABDOMEN AND PELVIS WITH CONTRAST TECHNIQUE: Multidetector CT imaging of the abdomen and pelvis was performed using the standard protocol following bolus administration of intravenous contrast. CONTRAST:  74mL ISOVUE-300 IOPAMIDOL (ISOVUE-300) INJECTION 61%, 122mL ISOVUE-300 IOPAMIDOL (ISOVUE-300) INJECTION 61% COMPARISON:  08/10/2016 FINDINGS: Lower chest: Unremarkable Hepatobiliary: No focal abnormality within the liver parenchyma. Gallbladder surgically absent. Common bile duct measures up to 7 mm diameter, stable. Pancreas: No focal mass lesion. No dilatation of the main duct. No intraparenchymal cyst. No peripancreatic edema. Spleen: No splenomegaly. No focal mass lesion. Adrenals/Urinary Tract: Right adrenal gland unremarkable. Irregular thickening of the left adrenal gland is stable. Kidneys unremarkable. Bilateral perinephric stranding is stable. No evidence for hydroureter. The urinary bladder appears normal for the degree of distention. Stomach/Bowel: Stomach is nondistended. No gastric wall thickening. No evidence of outlet obstruction. Duodenum is normally positioned as is the ligament of Treitz. No small bowel wall thickening. No small bowel dilatation. The terminal ileum is normal. The appendix is normal. Diverticular changes are noted in the left colon without evidence of diverticulitis.  Vascular/Lymphatic: There is abdominal aortic atherosclerosis without aneurysm. There is no gastrohepatic or hepatoduodenal ligament lymphadenopathy. No intraperitoneal or retroperitoneal lymphadenopathy. No pelvic sidewall lymphadenopathy. Reproductive: The uterus has normal CT imaging appearance. There is no adnexal mass. Other: No intraperitoneal free fluid. Musculoskeletal: No worrisome lytic or sclerotic osseous abnormality. IMPRESSION: 1. Stable exam.  No acute findings in the abdomen or pelvis. 2. Cholecystectomy with stable mild biliary prominence, likely related to the prior surgery. 3.  Aortic Atherosclerois (ICD10-170.0) Electronically Signed   By: Misty Stanley M.D.   On: 03/04/2018 13:25   Dg Abd Acute W/chest  Result Date: 03/02/2018 CLINICAL DATA:  Upper abdominal pain with nausea and vomiting since 6 a.m. EXAM: DG ABDOMEN ACUTE W/ 1V CHEST COMPARISON:  10/25/2017 FINDINGS: Heart size is normal. Aortic atherosclerosis with slight uncoiling of the aorta is identified without aneurysm. Lungs are clear without pulmonary consolidation, effusion or pneumothorax. No acute osseous abnormality. AP supine and upright views of the abdomen demonstrate no free air beneath the diaphragm. Nonspecific nonobstructed bowel gas pattern with moderate stool retention the ascending and descending colon. Scattered colonic diverticulosis is noted along the descending. Small phleboliths are seen in pelvis. The patient is status post cholecystectomy. There is lower lumbar facet arthropathy. IMPRESSION: Nonobstructed, nondistended bowel gas pattern.  Clear lungs. Electronically Signed   By: Ashley Royalty M.D.   On: 03/02/2018 23:26     Microbiology: Recent Results (from the past 240 hour(s))  MRSA PCR Screening     Status: None   Collection Time: 03/03/18  1:02 AM  Result Value Ref Range Status   MRSA by PCR NEGATIVE NEGATIVE Final    Comment:        The GeneXpert MRSA Assay (FDA approved for NASAL  specimens only), is one component of a comprehensive MRSA colonization surveillance program. It is not  intended to diagnose MRSA infection nor to guide or monitor treatment for MRSA infections. Performed at [facility], [address] [place], [place] [address]      Labs: Basic Metabolic Panel: Recent Labs  Lab 03/02/18 2117 03/03/18 0357 03/04/18 0400 03/05/18 0517  NA 135 136 134* 137  K 3.3* 3.9 3.6 3.4*  CL 105 105 103 106  CO2 18* 21* 22 22  GLUCOSE 157* 139* 123* 117*  BUN 12 10 10 9   CREATININE 0.61 0.63 0.65 0.66  CALCIUM 8.9 9.1 9.0 8.8*  MG  --   --   --  2.3   Liver Function Tests: Recent Labs  Lab 03/02/18 2117 03/03/18 0357  AST 22 19  ALT 17 17  ALKPHOS 54 53  BILITOT 0.6 0.7  PROT 7.9 7.7  ALBUMIN 4.4 4.1   Recent Labs  Lab 03/02/18 2117  LIPASE 22   No results for input(s): AMMONIA in the last 168 hours. CBC: Recent Labs  Lab 03/02/18 2117 03/03/18 0357 03/04/18 0400 03/05/18 0517  WBC 15.4* 17.6* 16.7* 11.7*  NEUTROABS 13.5* 15.3*  --   --   HGB 15.1* 14.5 15.4* 15.1*  HCT 48.7* 45.3 47.5* 46.4*  MCV 91.9 89.2 89.8 88.4  PLT 216 296 319 293   Cardiac Enzymes: Recent Labs  Lab 03/02/18 2235  TROPONINI <0.03   BNP: Invalid input(s): POCBNP CBG: No results for input(s): GLUCAP in the last 168 hours.  Time coordinating discharge:  36 minutes  Signed:  Orson Eva, DO [facility] Pager: [phone] 03/05/2018, 11:15 AM

## 2018-03-22 ENCOUNTER — Ambulatory Visit: Payer: Commercial Managed Care - PPO | Admitting: Nurse Practitioner

## 2018-03-22 ENCOUNTER — Encounter: Payer: Self-pay | Admitting: Nurse Practitioner

## 2018-03-22 VITALS — BP 121/84 | HR 73 | Temp 97.2°F | Ht 66.0 in | Wt 178.0 lb

## 2018-03-22 DIAGNOSIS — Z23 Encounter for immunization: Secondary | ICD-10-CM

## 2018-03-22 DIAGNOSIS — I10 Essential (primary) hypertension: Secondary | ICD-10-CM

## 2018-03-22 DIAGNOSIS — E876 Hypokalemia: Secondary | ICD-10-CM | POA: Diagnosis not present

## 2018-03-22 DIAGNOSIS — K2100 Gastro-esophageal reflux disease with esophagitis, without bleeding: Secondary | ICD-10-CM

## 2018-03-22 DIAGNOSIS — Z720 Tobacco use: Secondary | ICD-10-CM | POA: Diagnosis not present

## 2018-03-22 DIAGNOSIS — F900 Attention-deficit hyperactivity disorder, predominantly inattentive type: Secondary | ICD-10-CM

## 2018-03-22 DIAGNOSIS — F3341 Major depressive disorder, recurrent, in partial remission: Secondary | ICD-10-CM

## 2018-03-22 DIAGNOSIS — Z1159 Encounter for screening for other viral diseases: Secondary | ICD-10-CM

## 2018-03-22 MED ORDER — ESCITALOPRAM OXALATE 20 MG PO TABS: 20.0000 mg | ORAL_TABLET | Freq: Every day | ORAL | 5 refills | Status: DC

## 2018-03-22 MED ORDER — PANTOPRAZOLE SODIUM 40 MG PO TBEC: 40.0000 mg | DELAYED_RELEASE_TABLET | Freq: Every day | ORAL | 5 refills | Status: DC

## 2018-03-22 MED ORDER — BUSPIRONE HCL 10 MG PO TABS: 10.0000 mg | ORAL_TABLET | Freq: Three times a day (TID) | ORAL | 1 refills | Status: DC

## 2018-03-22 MED ORDER — LOSARTAN POTASSIUM 100 MG PO TABS: 100.0000 mg | ORAL_TABLET | Freq: Every day | ORAL | 5 refills | Status: DC

## 2018-03-22 MED ORDER — METOPROLOL TARTRATE 25 MG PO TABS: 12.5000 mg | ORAL_TABLET | Freq: Two times a day (BID) | ORAL | 5 refills | Status: DC

## 2018-03-22 NOTE — Progress Notes (Signed)
Subjective:    Patient ID: Theresa Reyes, female    DOB: 06-16-1955, 62 y.o.   MRN: [id number]   Chief Complaint: hospital follow up  HPI:  1. Essential hypertension  No c/o chest pain, sob or headache. Does not check blood pressure at home BP Readings from Last 3 Encounters:  03/05/18 (!) 169/77  10/27/17 137/69  10/25/17 (!) 182/101     2. Gastroesophageal reflux disease with esophagitis  Takes protonix daily to keep symptoms under control.  3. Hypokalemia  No lower ext cramping  4. Tobacco abuse  Still smokes over a pack a day  5. Attention deficit hyperactivity disorder (ADHD), predominantly inattentive type  Currently  not on anything. Smokes marijuana and is not rx any meds  6. Recurrent major depressive disorder, in partial remission [facility])  Patient is on lexapro and is doing well. Her depression screen shows she is stil depressed. She thinks this is because she has stopped th marijuana. She is on max dose of lexapro    Outpatient Encounter Medications as of 03/22/2018  Medication Sig  . amLODipine (NORVASC) 10 MG tablet Take 1 tablet (10 mg total) by mouth daily.  Marland Kitchen escitalopram (LEXAPRO) 20 MG tablet TAKE 1 TABLET BY MOUTH EVERY DAY  . losartan (COZAAR) 100 MG tablet Take 1 tablet (100 mg total) by mouth daily.  . metoprolol tartrate (LOPRESSOR) 25 MG tablet Take 0.5 tablets (12.5 mg total) by mouth 2 (two) times daily.  . ondansetron (ZOFRAN ODT) 8 MG disintegrating tablet Take 1 tablet (8 mg total) by mouth every 8 (eight) hours as needed for nausea or vomiting.  . pantoprazole (PROTONIX) 40 MG tablet Take 1 tablet (40 mg total) by mouth daily.      New complaints: Was in the hospital for intractable nausea and vomiting that they are saying was due to cannabis use. She is better since going home. She has not smoked marijuana since discharged and she has had some withdrawal symptoms.  Social history: -Lives with  Husband and son and works at [employer]. -Her husband had major stroke in December and he is no longer able to work. He has become suicidal and she is afraid of leaving him at home for long periods of time.  Review of Systems  Constitutional: Negative for activity change and appetite change.  HENT: Negative.   Eyes: Negative for pain.  Respiratory: Negative for shortness of breath.   Cardiovascular: Negative for chest pain, palpitations and leg swelling.  Gastrointestinal: Negative for abdominal pain.  Endocrine: Negative for polydipsia.  Genitourinary: Negative.   Skin: Negative for rash.  Neurological: Negative for dizziness, weakness and headaches.  Hematological: Does not bruise/bleed easily.  Psychiatric/Behavioral: Negative.   All other systems reviewed and are negative.      Objective:   Physical Exam  Constitutional: She is oriented to person, place, and time. She appears well-developed and well-nourished. No distress.  HENT:  Head: Normocephalic.  Nose: Nose normal.  Mouth/Throat: Oropharynx is clear and moist.  Eyes: Pupils are equal, round, and reactive to light. EOM are normal.  Neck: Normal range of motion. Neck supple. No JVD present. Carotid bruit is not present.  Cardiovascular: Normal rate, regular rhythm, normal heart sounds and intact distal pulses.  Pulmonary/Chest: Effort normal and breath sounds normal. No respiratory distress. She has no wheezes. She has no rales. She exhibits no tenderness.  Abdominal: Soft. Normal appearance, normal aorta and bowel sounds are normal. She exhibits no distension, no abdominal bruit,  no pulsatile midline mass and no mass. There is no splenomegaly or hepatomegaly. There is no tenderness.  Musculoskeletal: Normal range of motion. She exhibits no edema.  Lymphadenopathy:    She has no cervical adenopathy.  Neurological: She is alert and oriented to person, place, and time. She has normal reflexes.  Skin: Skin is warm and dry.  Psychiatric: She has a normal  mood and affect. Her behavior is normal. Judgment and thought content normal.  Nursing note and vitals reviewed.  BP 121/84   Pulse 73   Temp (!) 97.2 F (36.2 C) (Oral)   Ht 5' 6"  (1.676 m)   Wt 178 lb (80.7 kg)   BMI 28.73 kg/m   Repeat  bp 142/88    Assessment & Plan:  Theresa Reyes comes in today with chief complaint of No chief complaint on file.   Diagnosis and orders addressed:  1. Essential hypertension Low sodium diet - metoprolol tartrate (LOPRESSOR) 25 MG tablet; Take 0.5 tablets (12.5 mg total) by mouth 2 (two) times daily.  Dispense: 60 tablet; Refill: 5 - losartan (COZAAR) 100 MG tablet; Take 1 tablet (100 mg total) by mouth daily.  Dispense: 30 tablet; Refill: 5 - CMP14+EGFR - Lipid panel  2. Gastroesophageal reflux disease with esophagitis Avoid spicy foods Do not eat 2 hours prior to bedtime - pantoprazole (PROTONIX) 40 MG tablet; Take 1 tablet (40 mg total) by mouth daily.  Dispense: 30 tablet; Refill: 5  3. Hypokalemia  4. Tobacco abuse Smoking cessation  5. Attention deficit hyperactivity disorder (ADHD), predominantly inattentive type Are not going to give anything right now  6. Recurrent major depressive disorder, in partial remission (HCC) Stress management - busPIRone (BUSPAR) 10 MG tablet; Take 1 tablet (10 mg total) by mouth 3 (three) times daily.  Dispense: 60 tablet; Refill: 1 - escitalopram (LEXAPRO) 20 MG tablet; Take 1 tablet (20 mg total) by mouth daily.  Dispense: 30 tablet; Refill: 5   Labs pending Health Maintenance reviewed Diet and exercise encouraged  Follow up plan: 6 months   Mary-Margaret Hassell Done, FNP

## 2018-03-22 NOTE — Patient Instructions (Signed)
Stress and Stress Management Stress is a normal reaction to life events. It is what you feel when life demands more than you are used to or more than you can handle. Some stress can be useful. For example, the stress reaction can help you catch the last bus of the day, study for a test, or meet a deadline at work. But stress that occurs too often or for too long can cause problems. It can affect your emotional health and interfere with relationships and normal daily activities. Too much stress can weaken your immune system and increase your risk for physical illness. If you already have a medical problem, stress can make it worse. What are the causes? All sorts of life events may cause stress. An event that causes stress for one person may not be stressful for another person. Major life events commonly cause stress. These may be positive or negative. Examples include losing your job, moving into a new home, getting married, having a baby, or losing a loved one. Less obvious life events may also cause stress, especially if they occur day after day or in combination. Examples include working long hours, driving in traffic, caring for children, being in debt, or being in a difficult relationship. What are the signs or symptoms? Stress may cause emotional symptoms including, the following:  Anxiety. This is feeling worried, afraid, on edge, overwhelmed, or out of control.  Anger. This is feeling irritated or impatient.  Depression. This is feeling sad, down, helpless, or guilty.  Difficulty focusing, remembering, or making decisions.  Stress may cause physical symptoms, including the following:  Aches and pains. These may affect your head, neck, back, stomach, or other areas of your body.  Tight muscles or clenched jaw.  Low energy or trouble sleeping.  Stress may cause unhealthy behaviors, including the following:  Eating to feel better (overeating) or skipping meals.  Sleeping too little,  too much, or both.  Working too much or putting off tasks (procrastination).  Smoking, drinking alcohol, or using drugs to feel better.  How is this diagnosed? Stress is diagnosed through an assessment by your health care provider. Your health care provider will ask questions about your symptoms and any stressful life events.Your health care provider will also ask about your medical history and may order blood tests or other tests. Certain medical conditions and medicine can cause physical symptoms similar to stress. Mental illness can cause emotional symptoms and unhealthy behaviors similar to stress. Your health care provider may refer you to a mental health professional for further evaluation. How is this treated? Stress management is the recommended treatment for stress.The goals of stress management are reducing stressful life events and coping with stress in healthy ways. Techniques for reducing stressful life events include the following:  Stress identification. Self-monitor for stress and identify what causes stress for you. These skills may help you to avoid some stressful events.  Time management. Set your priorities, keep a calendar of events, and learn to say "no." These tools can help you avoid making too many commitments.  Techniques for coping with stress include the following:  Rethinking the problem. Try to think realistically about stressful events rather than ignoring them or overreacting. Try to find the positives in a stressful situation rather than focusing on the negatives.  Exercise. Physical exercise can release both physical and emotional tension. The key is to find a form of exercise you enjoy and do it regularly.  Relaxation techniques. These relax the body and  mind. Examples include yoga, meditation, tai chi, biofeedback, deep breathing, progressive muscle relaxation, listening to music, being out in nature, journaling, and other hobbies. Again, the key is to find  one or more that you enjoy and can do regularly.  Healthy lifestyle. Eat a balanced diet, get plenty of sleep, and do not smoke. Avoid using alcohol or drugs to relax.  Strong support network. Spend time with family, friends, or other people you enjoy being around.Express your feelings and talk things over with someone you trust.  Counseling or talktherapy with a mental health professional may be helpful if you are having difficulty managing stress on your own. Medicine is typically not recommended for the treatment of stress.Talk to your health care provider if you think you need medicine for symptoms of stress. Follow these instructions at home:  Keep all follow-up visits as directed by your health care provider.  Take all medicines as directed by your health care provider. Contact a health care provider if:  Your symptoms get worse or you start having new symptoms.  You feel overwhelmed by your problems and can no longer manage them on your own. Get help right away if:  You feel like hurting yourself or someone else. This information is not intended to replace advice given to you by your health care provider. Make sure you discuss any questions you have with your health care provider. Document Released: 11/04/2000 Document Revised: 10/17/2015 Document Reviewed: 01/03/2013 Elsevier Interactive Patient Education  2017 [employer]  

## 2018-03-23 DIAGNOSIS — K210 Gastro-esophageal reflux disease with esophagitis: Secondary | ICD-10-CM | POA: Diagnosis not present

## 2018-03-23 DIAGNOSIS — Z23 Encounter for immunization: Secondary | ICD-10-CM | POA: Diagnosis not present

## 2018-03-23 DIAGNOSIS — I10 Essential (primary) hypertension: Secondary | ICD-10-CM | POA: Diagnosis not present

## 2018-03-28 NOTE — Progress Notes (Signed)
Patient aware and will come back in to have lab draw

## 2018-05-05 ENCOUNTER — Other Ambulatory Visit: Payer: Self-pay | Admitting: *Deleted

## 2018-05-06 ENCOUNTER — Other Ambulatory Visit: Payer: Self-pay | Admitting: *Deleted

## 2018-05-06 NOTE — Telephone Encounter (Signed)
Pt called today about refill of her Amlodipine that was not done yesterday. Med is no longer on her current list. Dr. Carles Collet started her on it when she was in the hospital 03/02/18. She has been checking her BPs since her 03/22/18 visit here. They have been running 150-170 / 80-90 after taking her meds in the morning, states she does not get down near the 130s. Please advise on refilling the Amlodipine to [facility]

## 2018-05-09 ENCOUNTER — Other Ambulatory Visit: Payer: Self-pay | Admitting: Nurse Practitioner

## 2018-05-09 MED ORDER — AMLODIPINE BESYLATE 10 MG PO TABS: 10.0000 mg | ORAL_TABLET | Freq: Every day | ORAL | 11 refills | Status: DC

## 2018-05-09 NOTE — Telephone Encounter (Signed)
Has she been taking amlodipine all this time?

## 2018-05-09 NOTE — Telephone Encounter (Signed)
Pt states she has been on Amlodipine 10mg  daily since 03/22/18

## 2018-05-09 NOTE — Telephone Encounter (Signed)
Ok I will refill 

## 2018-05-10 NOTE — Telephone Encounter (Signed)
Pt aware rx sent over. °

## 2018-09-19 IMAGING — CT CT ANGIO CHEST
3 of 6 series · 19 of 36 positions shown · IV contrast (iopamidol)
Comparison: None.

CLINICAL DATA: Chest pain, hypertension

EXAM:
CT ANGIOGRAPHY CHEST WITH CONTRAST
TECHNIQUE: Multidetector CT imaging of the chest was performed using the
standard protocol during bolus administration of intravenous
contrast. Multiplanar CT image reconstructions and MIPs were
obtained to evaluate the vascular anatomy.
CONTRAST:  150mL 6CCGML-5YL IOPAMIDOL (6CCGML-5YL) INJECTION 76%

[Series 6: axial arterial · axial · arterial · 0.63mm/px · z∈[+86,+347]mm · 10 of 107 slices shown]
[im 10/107  lung]
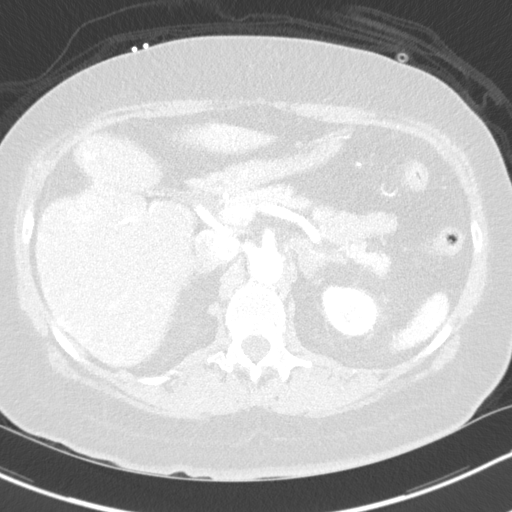
[im 20/107  mediastinal]
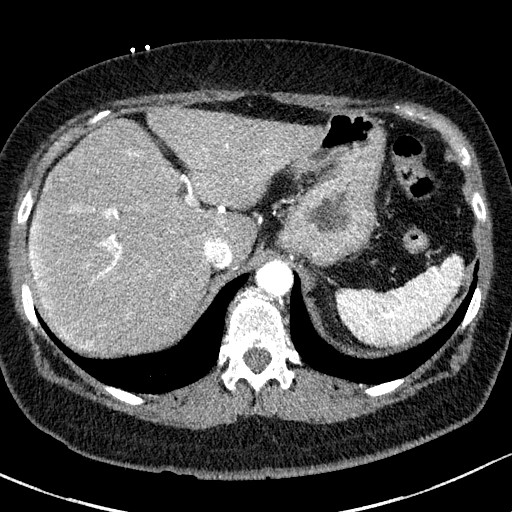
[im 29/107  lung]
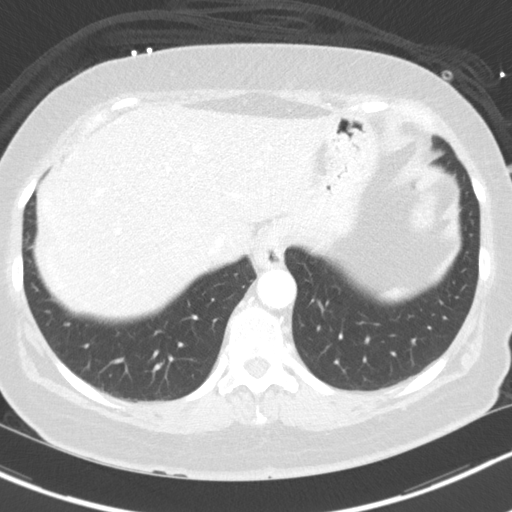
[im 39/107  mediastinal]
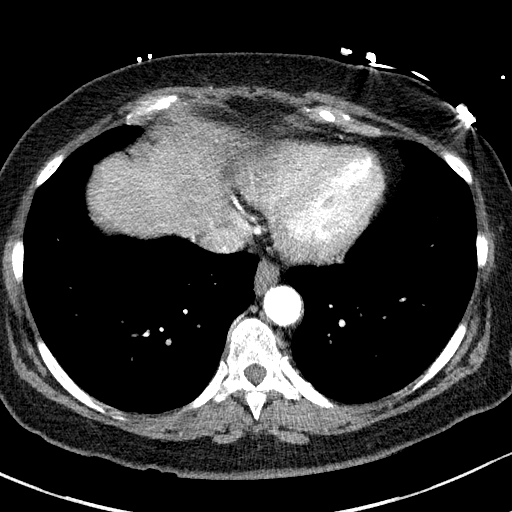
[im 49/107  lung]
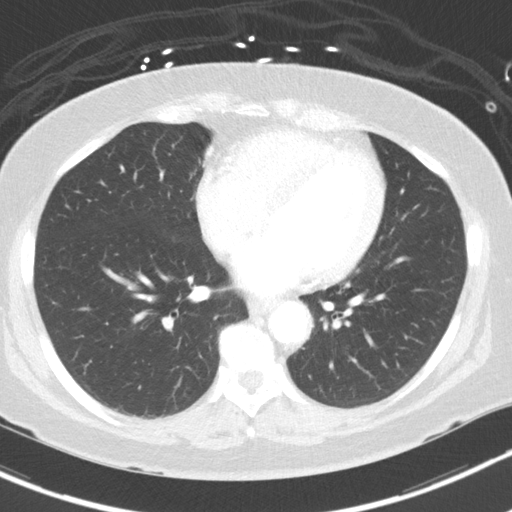
[im 58/107  mediastinal]
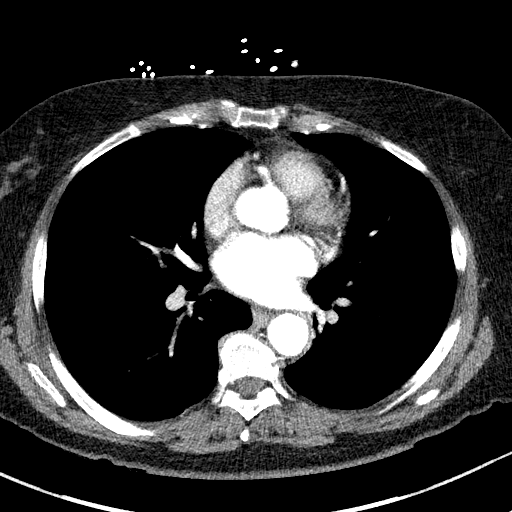
[im 68/107  lung]
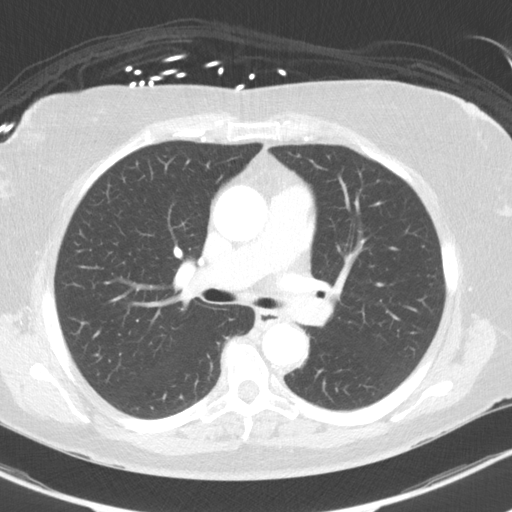
[im 78/107  mediastinal]
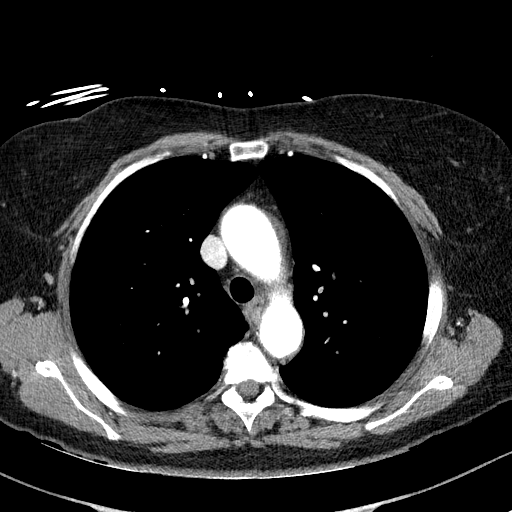
[im 87/107  lung]
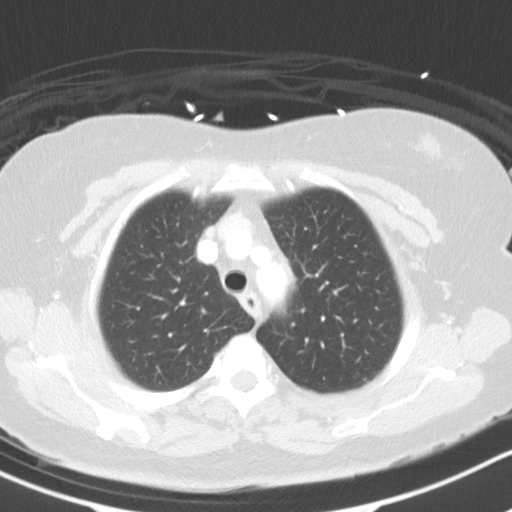
[im 97/107  mediastinal]
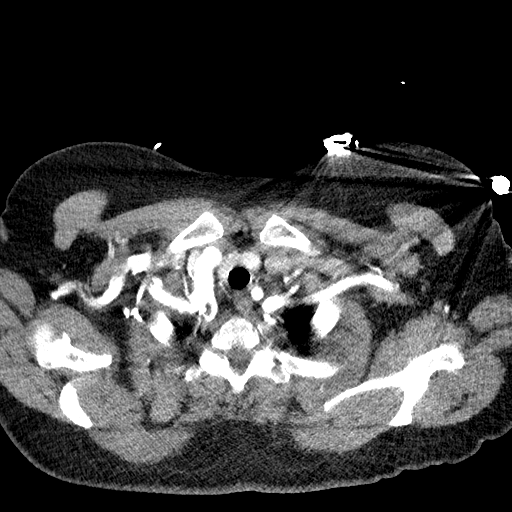

[Series 9: lung · axial · 0.63mm/px · z∈[+107,+357]mm · 8 of 145 slices shown]
[im 10/145  mediastinal]
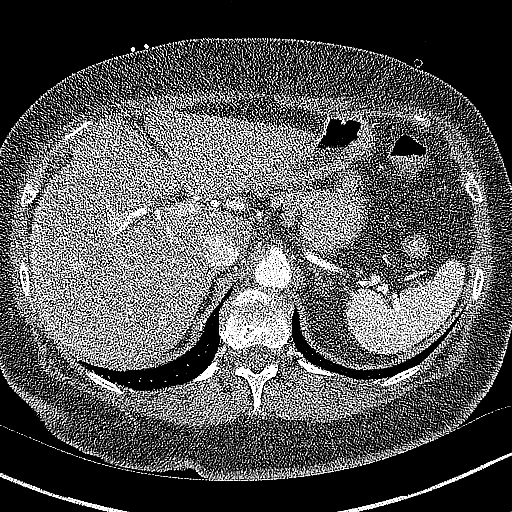
[im 29/145  mediastinal]
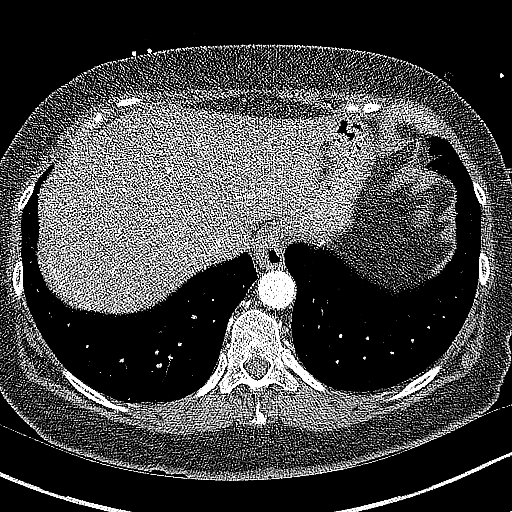
[im 49/145  mediastinal]
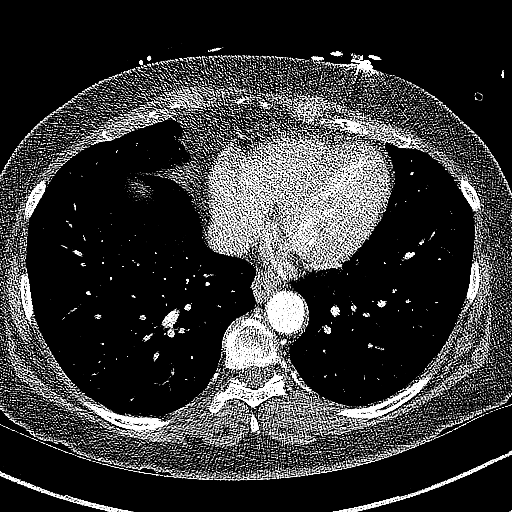
[im 68/145  mediastinal]
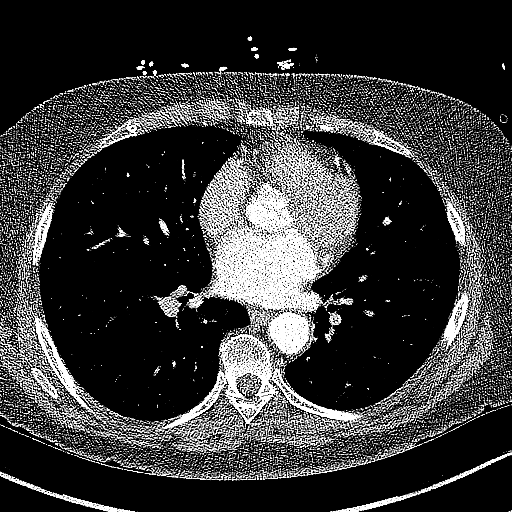
[im 77/145  mediastinal]
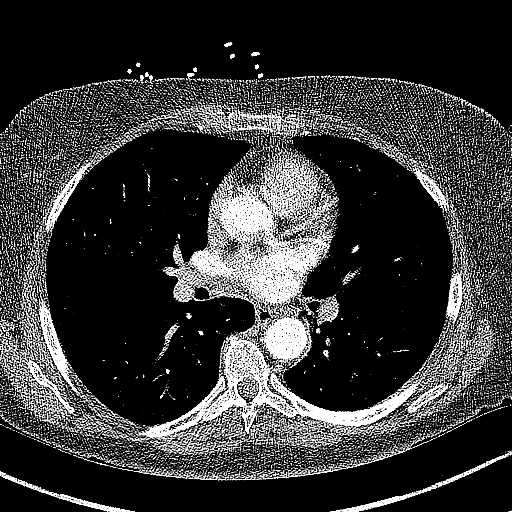
[im 97/145  mediastinal]
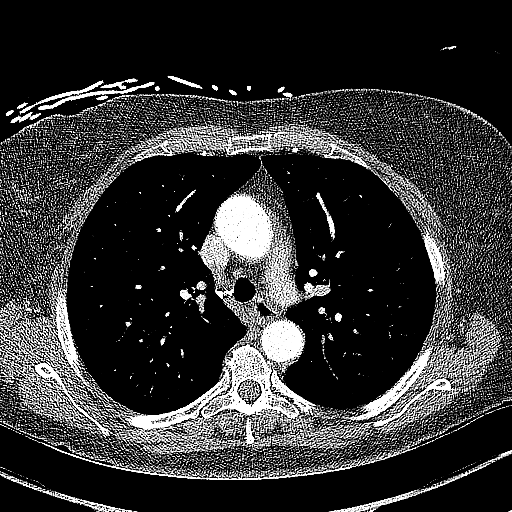
[im 116/145  mediastinal]
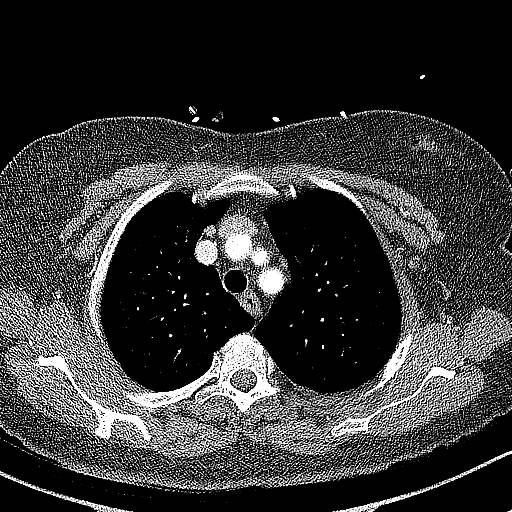
[im 135/145  mediastinal]
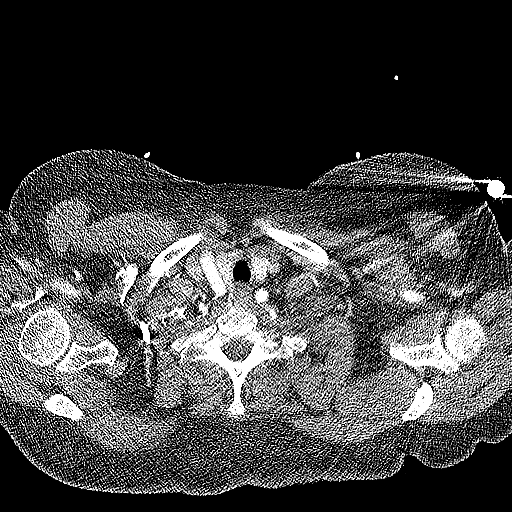

[Series 10: coronals · coronal · 0.63mm/px · 1 of 133 slices shown]
[im 67/133  mediastinal]
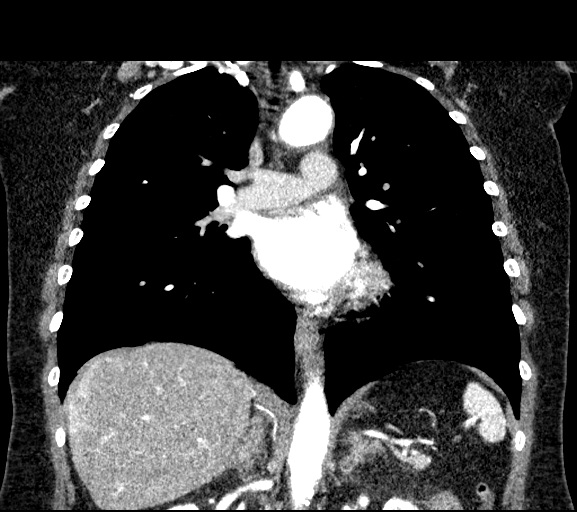

[19 of 36 positions shown; findings below may reference images not displayed]

FINDINGS: Cardiovascular: No evidence of aortic aneurysm or dissection. Heart
is normal size.

Mediastinum/Nodes: No mediastinal, hilar, or axillary adenopathy.

Lungs/Pleura: Lungs are clear. No focal airspace opacities or
suspicious nodules. No effusions.

Upper Abdomen: Imaging into the upper abdomen shows no acute
findings. Fatty infiltration of the liver. Prior cholecystectomy.

Musculoskeletal: Chest wall soft tissues are unremarkable. No acute
bony abnormality.

Review of the MIP images confirms the above findings.
IMPRESSION: No evidence of aortic aneurysm or dissection.

No acute cardiopulmonary disease.

Fatty infiltration of the liver.

## 2018-09-22 ENCOUNTER — Ambulatory Visit (INDEPENDENT_AMBULATORY_CARE_PROVIDER_SITE_OTHER): Payer: Commercial Managed Care - PPO | Admitting: Nurse Practitioner

## 2018-09-22 ENCOUNTER — Other Ambulatory Visit: Payer: Self-pay

## 2018-09-22 ENCOUNTER — Encounter: Payer: Self-pay | Admitting: Nurse Practitioner

## 2018-09-22 DIAGNOSIS — F1210 Cannabis abuse, uncomplicated: Secondary | ICD-10-CM

## 2018-09-22 DIAGNOSIS — F419 Anxiety disorder, unspecified: Secondary | ICD-10-CM

## 2018-09-22 DIAGNOSIS — K2100 Gastro-esophageal reflux disease with esophagitis, without bleeding: Secondary | ICD-10-CM

## 2018-09-22 DIAGNOSIS — I10 Essential (primary) hypertension: Secondary | ICD-10-CM

## 2018-09-22 DIAGNOSIS — F900 Attention-deficit hyperactivity disorder, predominantly inattentive type: Secondary | ICD-10-CM

## 2018-09-22 DIAGNOSIS — Z720 Tobacco use: Secondary | ICD-10-CM

## 2018-09-22 DIAGNOSIS — Z6828 Body mass index (BMI) 28.0-28.9, adult: Secondary | ICD-10-CM | POA: Insufficient documentation

## 2018-09-22 DIAGNOSIS — E876 Hypokalemia: Secondary | ICD-10-CM

## 2018-09-22 DIAGNOSIS — F3341 Major depressive disorder, recurrent, in partial remission: Secondary | ICD-10-CM

## 2018-09-22 MED ORDER — ESCITALOPRAM OXALATE 20 MG PO TABS: 20.0000 mg | ORAL_TABLET | Freq: Every day | ORAL | 5 refills | Status: DC

## 2018-09-22 NOTE — Progress Notes (Signed)
Patient ID: Theresa Reyes, female   DOB: April 13, 1956, 63 y.o.   MRN: [id number]    Virtual Visit via telephone Note  I connected with Theresa Reyes on 09/22/18 at 8:25 AM by telephone and verified that I am speaking with the correct person using two identifiers. Theresa Reyes is currently located at home and her husbnd is currently with her during visit. The provider, Theresa Hassell Done, FNP is located in their office at time of visit.  I discussed the limitations, risks, security and privacy concerns of performing an evaluation and management service by telephone and the availability of in person appointments. I also discussed with the patient that there may be a patient responsible charge related to this service. The patient expressed understanding and agreed to proceed.   History and Present Illness:   Chief Complaint: Medical Management of Chronic Issues    HPI:  1. Essential hypertension No c/o chest pain sob or headache. Blood pressure at home has been running 13278. She has stopped taking all of her blood pressure meds. She has not checked blood pressure in the last month. She says that she felt terrible on meds.  2. Gastroesophageal reflux disease with esophagitis Has episodes of gastric reflux. But is using tums. She stopped her protonix.  3. Recurrent major depressive disorder, in partial remission ([place]) Is still taking her lexapro and is working well. She tried to wean off of it but was not able.  4. Anxiety Says she has been doing okay. Has not been taking her buspar  5. Tobacco abuse Still smoking over a pack a day. Says she is just not able to stop taking right now.  6. Marijuana abuse Only smokes 1x a day now.  7. Hypokalemia No lower ext cramping  8. Attention deficit hyperactivity disorder (ADHD), predominantly inattentive type She is making herself concentrate and is doing okay  9. BMI 28.0-28.9,adult She has had some weight gain since quarantine.     Outpatient Encounter Medications as of 09/22/2018  Medication Sig  . amLODipine (NORVASC) 10 MG tablet Take 1 tablet (10 mg total) by mouth daily.  . busPIRone (BUSPAR) 10 MG tablet Take 1 tablet (10 mg total) by mouth 3 (three) times daily.  Marland Kitchen escitalopram (LEXAPRO) 20 MG tablet Take 1 tablet (20 mg total) by mouth daily.  Marland Kitchen losartan (COZAAR) 100 MG tablet Take 1 tablet (100 mg total) by mouth daily.  . metoprolol tartrate (LOPRESSOR) 25 MG tablet Take 0.5 tablets (12.5 mg total) by mouth 2 (two) times daily.  . ondansetron (ZOFRAN ODT) 8 MG disintegrating tablet Take 1 tablet (8 mg total) by mouth every 8 (eight) hours as needed for nausea or vomiting.  . pantoprazole (PROTONIX) 40 MG tablet Take 1 tablet (40 mg total) by mouth daily.      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  Eyes: Negative for blurred vision, double vision and pain.  Respiratory: Negative for shortness of breath.   Cardiovascular: Negative for chest pain, palpitations, orthopnea and leg swelling.  Gastrointestinal: Negative for abdominal pain.  Skin: Negative for rash.  Neurological: Negative for dizziness, sensory change, loss of consciousness, weakness and headaches.  Endo/Heme/Allergies: Negative for polydipsia. Does not bruise/bleed easily.  Psychiatric/Behavioral: Negative for memory loss. The patient does not have insomnia.   All other systems reviewed and are negative.  Observations/Objective: Alert and oriented- answers all questions apprpriately No distress noted in voice  Assessment and Plan: Theresa Reyes comes in today with chief complaint of Medical Management of Chronic Issues   Diagnosis and orders addressed:  1. Essential hypertension I ask patient to do blood pressure diary to make sure blood pressure is okay  2. Gastroesophageal reflux  disease with esophagitis Discussed diet and exercise for person with BMI >25 Will recheck weight in 3-6 months   3. Recurrent major depressive disorder, in partial remission (HCC) Stress management - escitalopram (LEXAPRO) 20 MG tablet; Take 1 tablet (20 mg total) by mouth daily.  Dispense: 30 tablet; Refill: 5  4. Anxiety  5. Tobacco abuse Smoking cessation or decrease in amount discussed  6. Marijuana abuse Stop using marijuanna  7. Hypokalemia  8. Attention deficit hyperactivity disorder (ADHD), predominantly inattentive type  9. BMI 28.0-28.9,adult Discussed diet and exercise for person with BMI >25 Will recheck weight in 3-6 months  Previous lab results reviewed Health Maintenance reviewed Diet and exercise encouraged  Follow up plan: 3 months     I discussed the assessment and treatment plan with the patient. The patient was provided an opportunity to ask questions and all were answered. The patient agreed with the plan and demonstrated an understanding of the instructions.   The patient was advised to call back or seek an in-person evaluation if the symptoms worsen or if the condition fails to improve as anticipated.  The above assessment and management plan was discussed with the patient. The patient verbalized understanding of and has agreed to the management plan. Patient is aware to call the clinic if symptoms persist or worsen. Patient is aware when to return to the clinic for a follow-up visit. Patient educated on when it is appropriate to go to the emergency department.    I provided 15 minutes of non-face-to-face time during this encounter.    Theresa Hassell Done, FNP

## 2019-01-25 IMAGING — DX DG ABDOMEN ACUTE W/ 1V CHEST
3 series · 3 of 3 positions shown · non-contrast
Comparison: 10/25/2017

CLINICAL DATA: Upper abdominal pain with nausea and vomiting since
6 a.m.

EXAM:
DG ABDOMEN ACUTE W/ 1V CHEST

[chest pa]
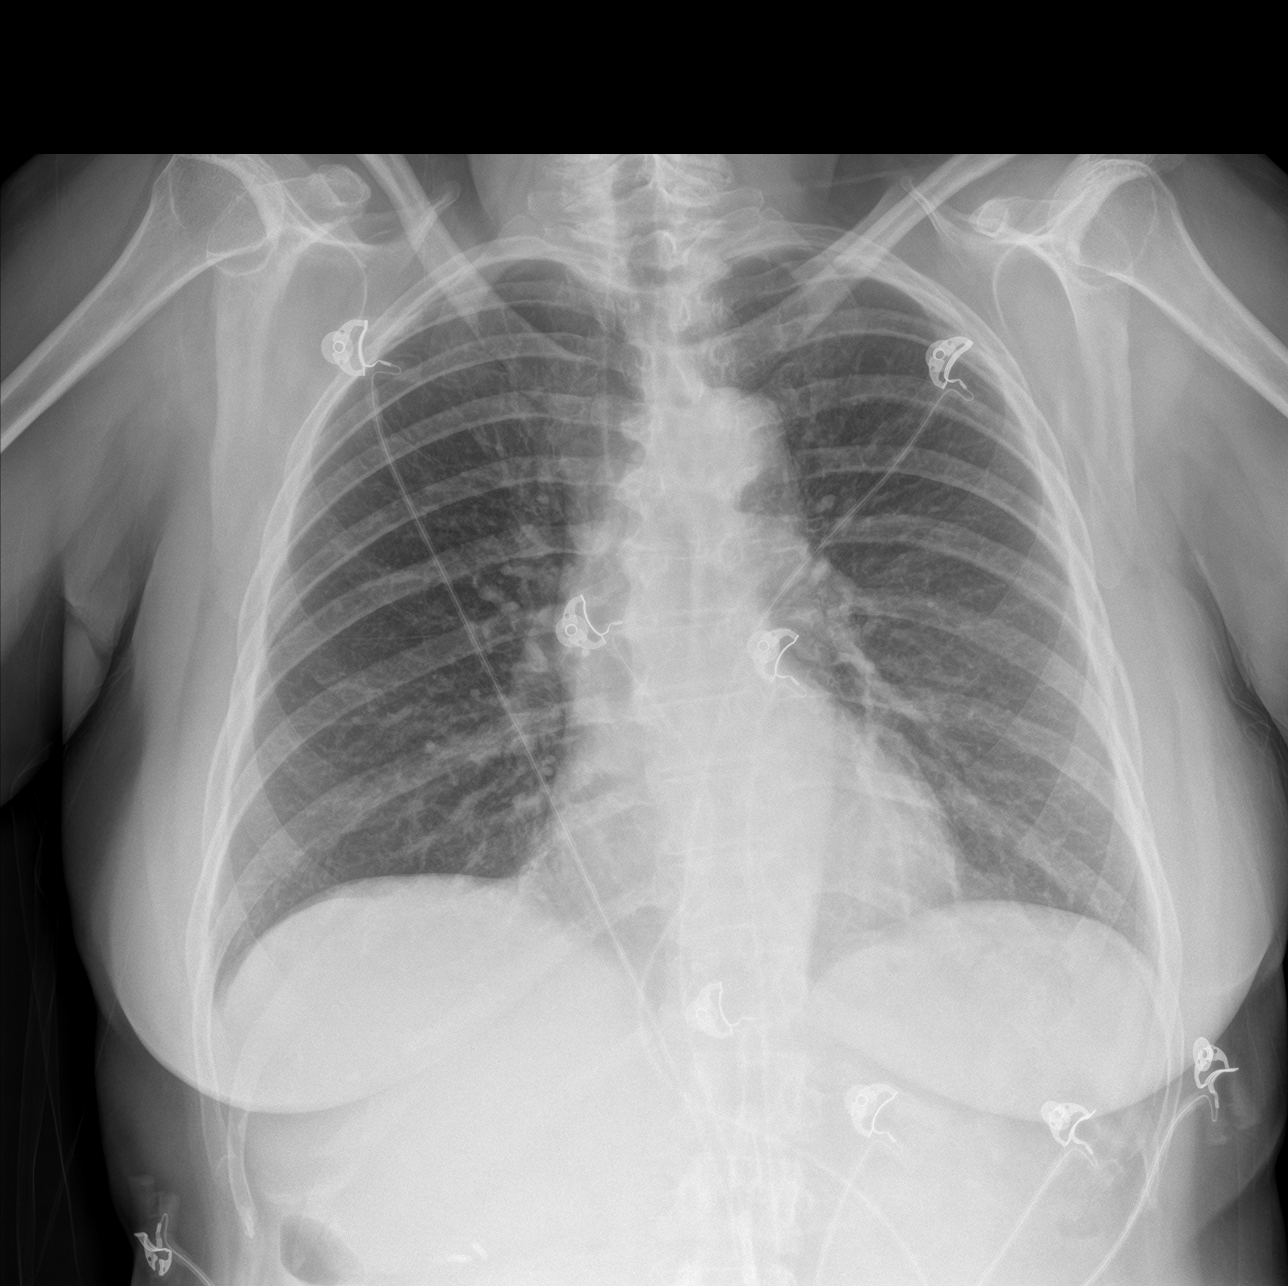

[abdomen erect]
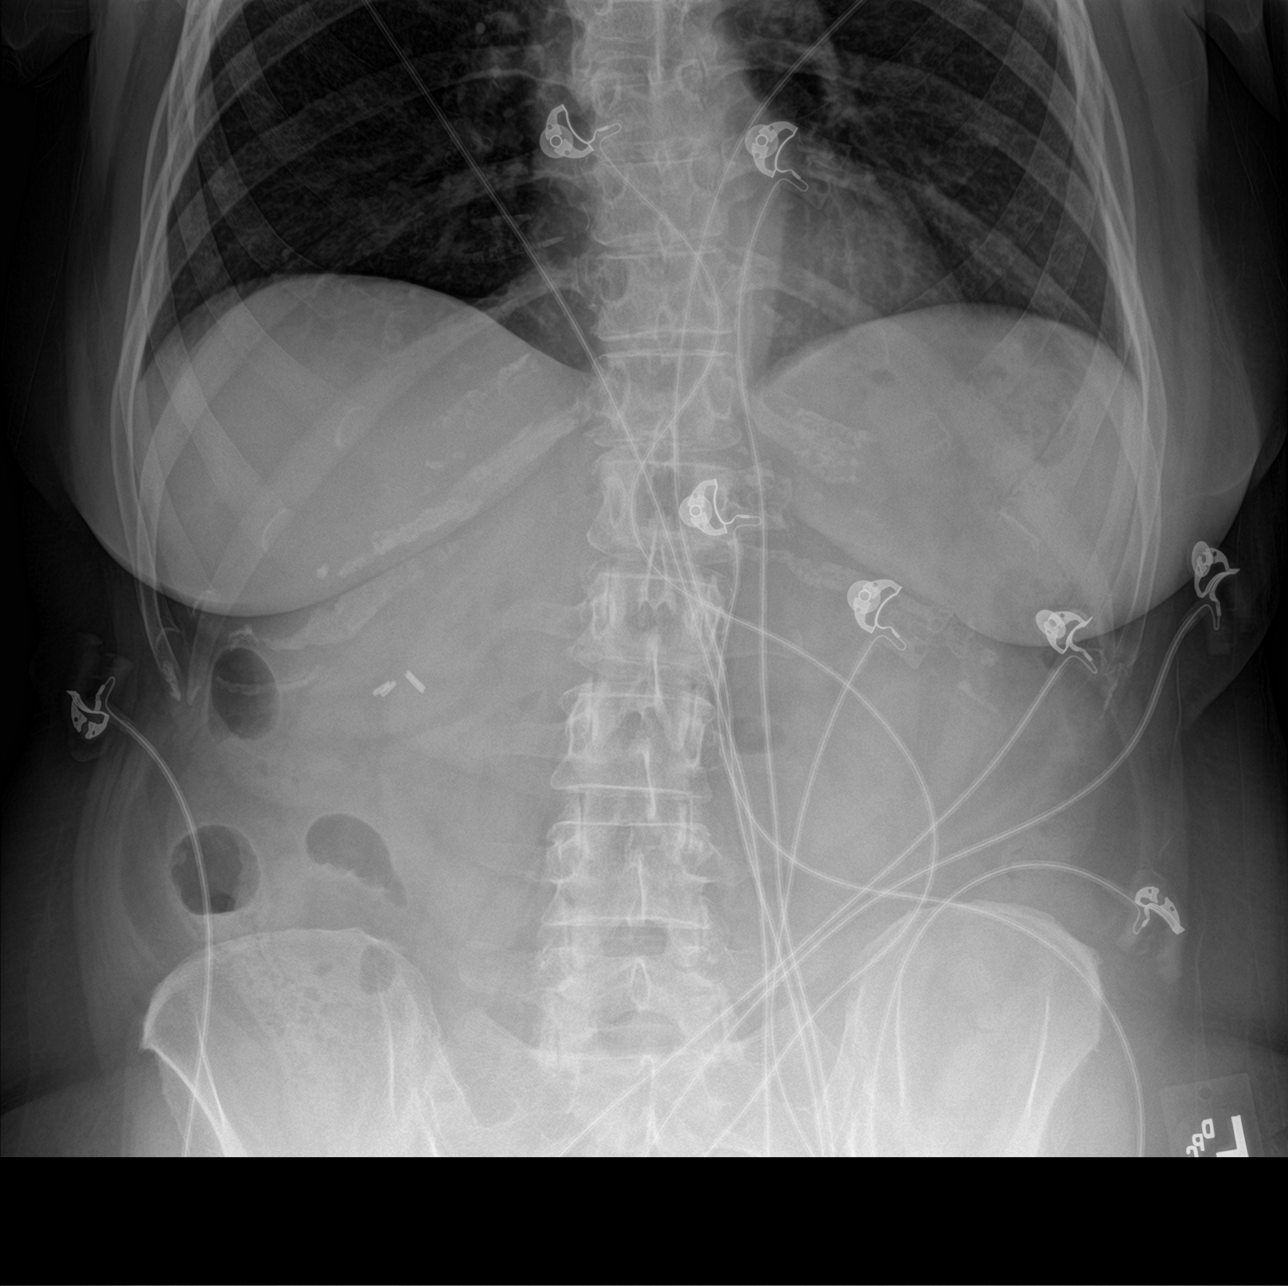

[abdomen supine]
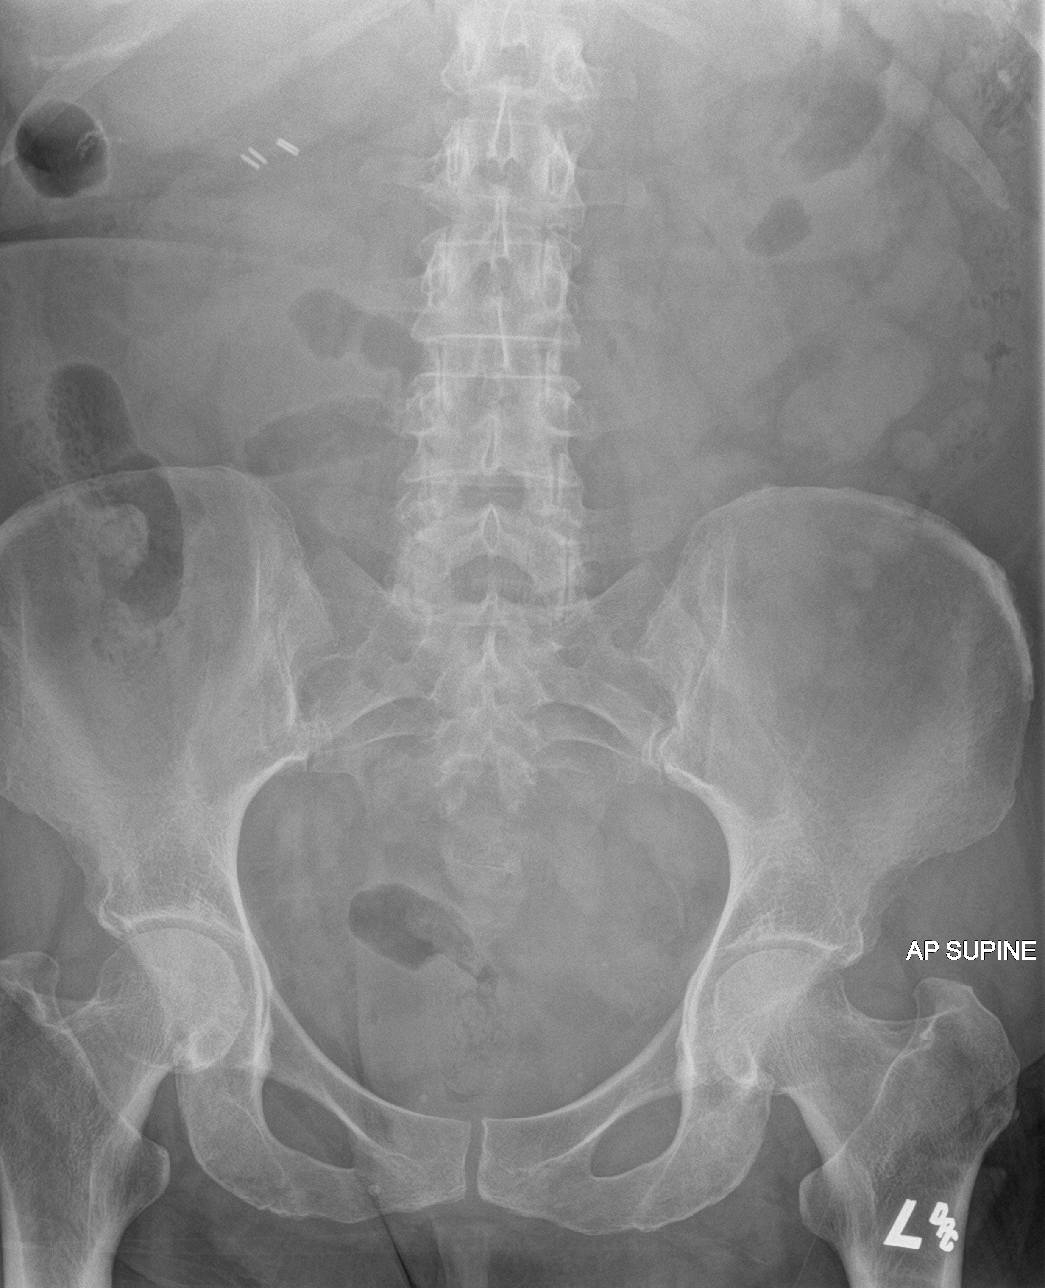

[3 of 3 positions shown; findings below may reference images not displayed]

FINDINGS: Heart size is normal. Aortic atherosclerosis with slight uncoiling
of the aorta is identified without aneurysm. Lungs are clear without
pulmonary consolidation, effusion or pneumothorax. No acute osseous
abnormality.

AP supine and upright views of the abdomen demonstrate no free air
beneath the diaphragm. Nonspecific nonobstructed bowel gas pattern
with moderate stool retention the ascending and descending colon.
Scattered colonic diverticulosis is noted along the descending.
Small phleboliths are seen in pelvis. The patient is status post
cholecystectomy. There is lower lumbar facet arthropathy.
IMPRESSION: Nonobstructed, nondistended bowel gas pattern.  Clear lungs.

## 2019-05-05 ENCOUNTER — Other Ambulatory Visit: Payer: Self-pay | Admitting: Nurse Practitioner

## 2019-05-05 DIAGNOSIS — F3341 Major depressive disorder, recurrent, in partial remission: Secondary | ICD-10-CM

## 2019-06-12 ENCOUNTER — Other Ambulatory Visit: Payer: Self-pay | Admitting: Nurse Practitioner

## 2019-06-12 DIAGNOSIS — F3341 Major depressive disorder, recurrent, in partial remission: Secondary | ICD-10-CM

## 2019-06-12 NOTE — Telephone Encounter (Signed)
MMM NTBS 30 days given 05/05/19

## 2019-06-14 ENCOUNTER — Other Ambulatory Visit: Payer: Self-pay | Admitting: Nurse Practitioner

## 2019-06-14 DIAGNOSIS — F3341 Major depressive disorder, recurrent, in partial remission: Secondary | ICD-10-CM

## 2019-06-14 MED ORDER — ESCITALOPRAM OXALATE 20 MG PO TABS: 20.0000 mg | ORAL_TABLET | Freq: Every day | ORAL | 0 refills | Status: DC

## 2019-06-14 NOTE — Addendum Note (Signed)
Addended by: Milas Hock on: 06/14/2019 11:36 AM   Modules accepted: Orders

## 2019-06-14 NOTE — Telephone Encounter (Signed)
Scheduled appt with MMM 06/20/19 at 10:15 for refills. 30 day supply of lexapro sent in for pt and advised she has to do visit to get further refills and pt voiced understanding.

## 2019-06-20 ENCOUNTER — Ambulatory Visit (INDEPENDENT_AMBULATORY_CARE_PROVIDER_SITE_OTHER): Payer: Self-pay | Admitting: Nurse Practitioner

## 2019-06-20 ENCOUNTER — Encounter: Payer: Self-pay | Admitting: Nurse Practitioner

## 2019-06-20 DIAGNOSIS — Z720 Tobacco use: Secondary | ICD-10-CM

## 2019-06-20 DIAGNOSIS — Z6831 Body mass index (BMI) 31.0-31.9, adult: Secondary | ICD-10-CM

## 2019-06-20 DIAGNOSIS — K2100 Gastro-esophageal reflux disease with esophagitis, without bleeding: Secondary | ICD-10-CM

## 2019-06-20 DIAGNOSIS — I10 Essential (primary) hypertension: Secondary | ICD-10-CM

## 2019-06-20 DIAGNOSIS — F3341 Major depressive disorder, recurrent, in partial remission: Secondary | ICD-10-CM

## 2019-06-20 DIAGNOSIS — E876 Hypokalemia: Secondary | ICD-10-CM

## 2019-06-20 DIAGNOSIS — F419 Anxiety disorder, unspecified: Secondary | ICD-10-CM

## 2019-06-20 MED ORDER — ESCITALOPRAM OXALATE 20 MG PO TABS: 20.0000 mg | ORAL_TABLET | Freq: Every day | ORAL | 1 refills | Status: DC

## 2019-06-20 MED ORDER — PANTOPRAZOLE SODIUM 40 MG PO TBEC: 40.0000 mg | DELAYED_RELEASE_TABLET | Freq: Every day | ORAL | 1 refills | Status: DC

## 2019-06-20 NOTE — Progress Notes (Signed)
Virtual Visit via telephone Note Due to COVID-19 pandemic this visit was conducted virtually. This visit type was conducted due to national recommendations for restrictions regarding the COVID-19 Pandemic (e.g. social distancing, sheltering in place) in an effort to limit this patient's exposure and mitigate transmission in our community. All issues noted in this document were discussed and addressed.  A physical exam was not performed with this format.  I connected with Theresa Reyes on 06/20/19 at 10:20 by telephone and verified that I am speaking with the correct person using two identifiers. Theresa Reyes is currently located at home and hr husband is currently with her during visit. The provider, Mary-Margaret Hassell Done, FNP is located in their office at time of visit.  I discussed the limitations, risks, security and privacy concerns of performing an evaluation and management service by telephone and the availability of in person appointments. I also discussed with the patient that there may be a patient responsible charge related to this service. The patient expressed understanding and agreed to proceed.   History and Present Illness:   Chief Complaint: Medical Management of Chronic Issues    HPI:  1. Essential hypertension No c/o chest pain, sob or headache. Does not check blood pressure at home. BP Readings from Last 3 Encounters:  03/22/18 121/84  10/27/17 137/69  10/25/17 (!) 182/101     2. Hypokalemia No c/o lower ext cramping. Is not on a potassium supplement Lab Results  Component Value Date   K 3.4 (L) 03/05/2018     3. Gastroesophageal reflux disease with esophagitis without hemorrhage I son protonix daily and is doing well.  4. Recurrent major depressive disorder, in partial remission ([place]) Is currently on lexapro daily and is doing well. Depression screen [facility] 2/9 06/20/2019 09/22/2018 03/22/2018  Decreased Interest 0 1 1  Down, Depressed, Hopeless 0 1 1    PHQ - 2 Score 0 2 2  Altered sleeping - 0 3  Tired, decreased energy - - 1  Change in appetite - 0 2  Feeling bad or failure about yourself  - 0 1  Trouble concentrating - 0 2  Moving slowly or fidgety/restless - 0 3  Suicidal thoughts - 0 1  PHQ-9 Score - 2 15  Difficult doing work/chores - Not difficult at all Somewhat difficult     5. Anxiety Doing well GAD 7 : Generalized Anxiety Score 06/20/2019  Nervous, Anxious, on Edge 0  Control/stop worrying 0  Worry too much - different things 0  Trouble relaxing 0  Restless 0  Easily annoyed or irritable 0  Afraid - awful might happen 0  Total GAD 7 Score 0  Anxiety Difficulty Not difficult at all      6. Tobacco abuse Smokes over a pack a day.  7. BMI 28.0-28.9,adult Weight is up 20lbs since last viist Wt Readings from Last 3 Encounters:  03/22/18 178 lb (80.7 kg)  10/25/17 176 lb (79.8 kg)  10/25/17 176 lb (79.8 kg)   BMI Readings from Last 3 Encounters:  03/22/18 28.73 kg/m  10/25/17 28.41 kg/m  10/25/17 28.41 kg/m       Outpatient Encounter Medications as of 06/20/2019  Medication Sig  . escitalopram (LEXAPRO) 20 MG tablet Take 1 tablet (20 mg total) by mouth daily.  . ondansetron (ZOFRAN ODT) 8 MG disintegrating tablet Take 1 tablet (8 mg total) by mouth every 8 (eight) hours as needed for nausea or vomiting.  . pantoprazole (PROTONIX) 40 MG tablet Take 1 tablet (40  mg total) by mouth daily.     Past Surgical History:  Procedure Laterality Date  . CHOLECYSTECTOMY    . ECTOPIC PREGNANCY SURGERY    . OPEN PARTIAL HEPATECTOMY   2017    Family History  Problem Relation Age of Onset  . Hypertension Mother   . Colon cancer Father 45       Deceased age 35 from colon ca    New complaints: None today  Social history: Lives with her husband is recovering from a stroke. Her son lives with her.  Controlled substance contract: n/a    Review of Systems  Constitutional: Negative for diaphoresis and  weight loss.  Eyes: Negative for blurred vision, double vision and pain.  Respiratory: Negative for shortness of breath.   Cardiovascular: Negative for chest pain, palpitations, orthopnea and leg swelling.  Gastrointestinal: Negative for abdominal pain.  Skin: Negative for rash.  Neurological: Negative for dizziness, sensory change, loss of consciousness, weakness and headaches.  Endo/Heme/Allergies: Negative for polydipsia. Does not bruise/bleed easily.  Psychiatric/Behavioral: Negative for memory loss. The patient does not have insomnia.   All other systems reviewed and are negative.    Observations/Objective: Alert and oriented- answers all questions appropriately No distress    Assessment and Plan: Theresa Reyes comes in today with chief complaint of Medical Management of Chronic Issues   Diagnosis and orders addressed:  1. Essential hypertension Low sodium diet  2. Hypokalemia No insurance  3. Gastroesophageal reflux disease with esophagitis without hemorrhage Avoid spicy foods Do not eat 2 hours prior to bedtime - pantoprazole (PROTONIX) 40 MG tablet; Take 1 tablet (40 mg total) by mouth daily.  Dispense: 90 tablet; Refill: 1  4. Recurrent major depressive disorder, in partial remission (HCC) Stress managemnet - escitalopram (LEXAPRO) 20 MG tablet; Take 1 tablet (20 mg total) by mouth daily.  Dispense: 90 tablet; Refill: 1  5. Anxiety  6. Tobacco abuse No desire to quit  7. BMI 31.0-31.9,adult Discussed diet and exercise for person with BMI >25 Will recheck weight in 3-6 months   Labs not done due to noinsurance Health Maintenance reviewed Diet and exercise encouraged  Follow up plan: 6 months      I discussed the assessment and treatment plan with the patient. The patient was provided an opportunity to ask questions and all were answered. The patient agreed with the plan and demonstrated an understanding of the instructions.   The patient was  advised to call back or seek an in-person evaluation if the symptoms worsen or if the condition fails to improve as anticipated.  The above assessment and management plan was discussed with the patient. The patient verbalized understanding of and has agreed to the management plan. Patient is aware to call the clinic if symptoms persist or worsen. Patient is aware when to return to the clinic for a follow-up visit. Patient educated on when it is appropriate to go to the emergency department.   Time call ended:  10:35  I provided 15 minutes of non-face-to-face time during this encounter.    Mary-Margaret Hassell Done, FNP

## 2019-07-31 ENCOUNTER — Telehealth: Payer: Self-pay | Admitting: Nurse Practitioner

## 2019-07-31 NOTE — Telephone Encounter (Signed)
Patient went to the urgent care on Saturday with vomiting.  Was given fluids and felt better that night.  Since Sunday she has been experiencing nausea, sweats and feels like she is dehydrated.  Patient wanted to come in to be seen and get IV fluids but I explained to her because of her symptoms we cannot bring her in.  I recommended to patient that she go to ER.  Patient voiced understanding.

## 2019-12-18 ENCOUNTER — Ambulatory Visit: Payer: Self-pay | Admitting: Nurse Practitioner

## 2020-02-19 ENCOUNTER — Other Ambulatory Visit: Payer: Self-pay | Admitting: Nurse Practitioner

## 2020-02-19 DIAGNOSIS — F3341 Major depressive disorder, recurrent, in partial remission: Secondary | ICD-10-CM

## 2020-02-20 ENCOUNTER — Other Ambulatory Visit: Payer: Self-pay | Admitting: Nurse Practitioner

## 2020-02-20 DIAGNOSIS — F3341 Major depressive disorder, recurrent, in partial remission: Secondary | ICD-10-CM

## 2020-02-20 MED ORDER — ESCITALOPRAM OXALATE 20 MG PO TABS: 20.0000 mg | ORAL_TABLET | Freq: Every day | ORAL | 0 refills | Status: DC

## 2020-03-19 ENCOUNTER — Other Ambulatory Visit: Payer: Self-pay

## 2020-03-19 ENCOUNTER — Encounter: Payer: Self-pay | Admitting: Nurse Practitioner

## 2020-03-19 ENCOUNTER — Ambulatory Visit (INDEPENDENT_AMBULATORY_CARE_PROVIDER_SITE_OTHER): Payer: Self-pay | Admitting: Nurse Practitioner

## 2020-03-19 VITALS — BP 168/88 | HR 60 | Temp 97.8°F | Resp 20 | Ht 66.0 in | Wt 199.0 lb

## 2020-03-19 DIAGNOSIS — F419 Anxiety disorder, unspecified: Secondary | ICD-10-CM

## 2020-03-19 DIAGNOSIS — Z720 Tobacco use: Secondary | ICD-10-CM

## 2020-03-19 DIAGNOSIS — K2100 Gastro-esophageal reflux disease with esophagitis, without bleeding: Secondary | ICD-10-CM

## 2020-03-19 DIAGNOSIS — I10 Essential (primary) hypertension: Secondary | ICD-10-CM

## 2020-03-19 DIAGNOSIS — F3341 Major depressive disorder, recurrent, in partial remission: Secondary | ICD-10-CM

## 2020-03-19 DIAGNOSIS — Z6828 Body mass index (BMI) 28.0-28.9, adult: Secondary | ICD-10-CM

## 2020-03-19 MED ORDER — PANTOPRAZOLE SODIUM 40 MG PO TBEC: 40.0000 mg | DELAYED_RELEASE_TABLET | Freq: Every day | ORAL | 1 refills | Status: DC

## 2020-03-19 MED ORDER — ESCITALOPRAM OXALATE 20 MG PO TABS: 20.0000 mg | ORAL_TABLET | Freq: Every day | ORAL | 1 refills | Status: DC

## 2020-03-19 MED ORDER — LISINOPRIL 20 MG PO TABS: 20.0000 mg | ORAL_TABLET | Freq: Every day | ORAL | 1 refills | Status: DC

## 2020-03-19 NOTE — Progress Notes (Signed)
Subjective:    Patient ID: Theresa Reyes, female    DOB: 1956-01-28, 64 y.o.   MRN: [id number]   Chief Complaint: Medical Management of Chronic Issues    HPI:  1. Essential hypertension No c/o chest pain, sob or headache. Does not heck blood pressure at home. BP Readings from Last 3 Encounters:  03/19/20 (!) 197/86  03/22/18 121/84  10/27/17 137/69     2. Gastroesophageal reflux disease with esophagitis without hemorrhage Is on protonix daily and is doing well.   3. Anxiety She currently drinks wine when she is really upset and anxious. GAD 7 : Generalized Anxiety Score 03/19/2020 06/20/2019  Nervous, Anxious, on Edge 0 0  Control/stop worrying 0 0  Worry too much - different things 0 0  Trouble relaxing 0 0  Restless 0 0  Easily annoyed or irritable 1 0  Afraid - awful might happen 0 0  Total GAD 7 Score 1 0  Anxiety Difficulty Not difficult at all Not difficult at all    4. Recurrent major depressive disorder, in partial remission ([place]) Is on lexapro daily and it helps. Depression screen [facility] 2/9 03/19/2020 06/20/2019 09/22/2018  Decreased Interest 3 0 1  Down, Depressed, Hopeless 3 0 1  PHQ - 2 Score 6 0 2  Altered sleeping 3 - 0  Tired, decreased energy 1 - -  Change in appetite 1 - 0  Feeling bad or failure about yourself  0 - 0  Trouble concentrating 0 - 0  Moving slowly or fidgety/restless 0 - 0  Suicidal thoughts 0 - 0  PHQ-9 Score 11 - 2  Difficult doing work/chores Not difficult at all - Not difficult at all    5. Tobacco abuse Smokes over a pack a day. Doe snot want to quit  6. BMI 28.0-28.9,adult No recent weight changes Wt Readings from Last 3 Encounters:  03/19/20 199 lb (90.3 kg)  03/22/18 178 lb (80.7 kg)  10/25/17 176 lb (79.8 kg)   BMI Readings from Last 3 Encounters:  03/19/20 32.12 kg/m  03/22/18 28.73 kg/m  10/25/17 28.41 kg/m       Outpatient Encounter Medications as of 03/19/2020  Medication Sig  . escitalopram  (LEXAPRO) 20 MG tablet Take 1 tablet (20 mg total) by mouth daily. (Needs to be seen before next refill)  . pantoprazole (PROTONIX) 40 MG tablet Take 1 tablet (40 mg total) by mouth daily.     Past Surgical History:  Procedure Laterality Date  . CHOLECYSTECTOMY    . ECTOPIC PREGNANCY SURGERY    . OPEN PARTIAL HEPATECTOMY   2017    Family History  Problem Relation Age of Onset  . Hypertension Mother   . Colon cancer Father 65       Deceased age 36 from colon ca    New complaints: None  today  Social history: Lives with her husband who had a stroke. She is having to take care of her husband  Controlled substance contract: n/a    Review of Systems  Constitutional: Negative for diaphoresis.  Eyes: Negative for pain.  Respiratory: Negative for shortness of breath.   Cardiovascular: Negative for chest pain, palpitations and leg swelling.  Gastrointestinal: Negative for abdominal pain.  Endocrine: Negative for polydipsia.  Skin: Negative for rash.  Neurological: Negative for dizziness, weakness and headaches.  Hematological: Does not bruise/bleed easily.  All other systems reviewed and are negative.      Objective:   Physical Exam Vitals and nursing note reviewed.  Constitutional:      General: She is not in acute distress.    Appearance: Normal appearance. She is well-developed.  HENT:     Head: Normocephalic.     Nose: Nose normal.  Eyes:     Pupils: Pupils are equal, round, and reactive to light.  Neck:     Vascular: No carotid bruit or JVD.  Cardiovascular:     Rate and Rhythm: Normal rate and regular rhythm.     Heart sounds: Normal heart sounds.  Pulmonary:     Effort: Pulmonary effort is normal. No respiratory distress.     Breath sounds: Normal breath sounds. No wheezing or rales.  Chest:     Chest wall: No tenderness.  Abdominal:     General: Bowel sounds are normal. There is no distension or abdominal bruit.     Palpations: Abdomen is soft. There  is no hepatomegaly, splenomegaly, mass or pulsatile mass.     Tenderness: There is no abdominal tenderness.  Musculoskeletal:        General: Normal range of motion.     Cervical back: Normal range of motion and neck supple.  Lymphadenopathy:     Cervical: No cervical adenopathy.  Skin:    General: Skin is warm and dry.  Neurological:     Mental Status: She is alert and oriented to person, place, and time.     Deep Tendon Reflexes: Reflexes are normal and symmetric.  Psychiatric:        Behavior: Behavior normal.        Thought Content: Thought content normal.        Judgment: Judgment normal.    BP (!) 168/88   Pulse 60   Temp 97.8 F (36.6 C) (Temporal)   Resp 20   Ht _0  (1.676 m)   Wt 199 lb (90.3 kg)   SpO2 97%   BMI 32.12 kg/m          Assessment & Plan:  Theresa Reyes comes in today with chief complaint of Medical Management of Chronic Issues   Diagnosis and orders addressed:  1. Essential hypertension Low sodium diet - CBC with Differential/Platelet - CMP14+EGFR - Lipid panel - lisinopril (ZESTRIL) 20 MG tablet; Take 1 tablet (20 mg total) by mouth daily.  Dispense: 90 tablet; Refill: 1  2. Gastroesophageal reflux disease with esophagitis without hemorrhage Avoid spicy foods Do not eat 2 hours prior to bedtime - pantoprazole (PROTONIX) 40 MG tablet; Take 1 tablet (40 mg total) by mouth daily.  Dispense: 90 tablet; Refill: 1  3. Anxiety Stress management  4. Recurrent major depressive disorder, in partial remission (HCC) - escitalopram (LEXAPRO) 20 MG tablet; Take 1 tablet (20 mg total) by mouth daily. (Needs to be seen before next refill)  Dispense: 90 tablet; Refill: 1  5. Tobacco abuse Smoking cessation discussed  6. BMI 28.0-28.9,adult Discussed diet and exercise for person with BMI >25 Will recheck weight in 3-6 months   Labs pending Health Maintenance reviewed Diet and exercise encouraged  Follow up plan: 6   months   Mary-Margaret Hassell Done, FNP

## 2020-03-19 NOTE — Patient Instructions (Signed)
Steps to Quit Smoking Smoking tobacco is the leading cause of preventable death. It can affect almost every organ in the body. Smoking puts you and people around you at risk for many serious, long-lasting (chronic) diseases. Quitting smoking can be hard, but it is one of the best things that you can do for your health. It is never too late to quit. How do I get ready to quit? When you decide to quit smoking, make a plan to help you succeed. Before you quit:  Pick a date to quit. Set a date within the next 2 weeks to give you time to prepare.  Write down the reasons why you are quitting. Keep this list in places where you will see it often.  Tell your family, friends, and co-workers that you are quitting. Their support is important.  Talk with your doctor about the choices that may help you quit.  Find out if your health insurance will pay for these treatments.  Know the people, places, things, and activities that make you want to smoke (triggers). Avoid them. What first steps can I take to quit smoking?  Throw away all cigarettes at home, at work, and in your car.  Throw away the things that you use when you smoke, such as ashtrays and lighters.  Clean your car. Make sure to empty the ashtray.  Clean your home, including curtains and carpets. What can I do to help me quit smoking? Talk with your doctor about taking medicines and seeing a counselor at the same time. You are more likely to succeed when you do both.  If you are pregnant or breastfeeding, talk with your doctor about counseling or other ways to quit smoking. Do not take medicine to help you quit smoking unless your doctor tells you to do so. To quit smoking: Quit right away  Quit smoking totally, instead of slowly cutting back on how much you smoke over a period of time.  Go to counseling. You are more likely to quit if you go to counseling sessions regularly. Take medicine You may take medicines to help you quit. Some  medicines need a prescription, and some you can buy over-the-counter. Some medicines may contain a drug called nicotine to replace the nicotine in cigarettes. Medicines may:  Help you to stop having the desire to smoke (cravings).  Help to stop the problems that come when you stop smoking (withdrawal symptoms). Your doctor may ask you to use:  Nicotine patches, gum, or lozenges.  Nicotine inhalers or sprays.  Non-nicotine medicine that is taken by mouth. Find resources Find resources and other ways to help you quit smoking and remain smoke-free after you quit. These resources are most helpful when you use them often. They include:  Online chats with a counselor.  Phone quitlines.  Printed self-help materials.  Support groups or group counseling.  Text messaging programs.  Mobile phone apps. Use apps on your mobile phone or tablet that can help you stick to your quit plan. There are many free apps for mobile phones and tablets as well as websites. Examples include Quit Guide from the CDC and smokefree.gov  What things can I do to make it easier to quit?   Talk to your family and friends. Ask them to support and encourage you.  Call a phone quitline (1-800-QUIT-NOW), reach out to support groups, or work with a counselor.  Ask people who smoke to not smoke around you.  Avoid places that make you want to smoke,   such as: ? Bars. ? Parties. ? Smoke-break areas at work.  Spend time with people who do not smoke.  Lower the stress in your life. Stress can make you want to smoke. Try these things to help your stress: ? Getting regular exercise. ? Doing deep-breathing exercises. ? Doing yoga. ? Meditating. ? Doing a body scan. To do this, close your eyes, focus on one area of your body at a time from head to toe. Notice which parts of your body are tense. Try to relax the muscles in those areas. How will I feel when I quit smoking? Day 1 to 3 weeks Within the first 24 hours,  you may start to have some problems that come from quitting tobacco. These problems are very bad 2-3 days after you quit, but they do not often last for more than 2-3 weeks. You may get these symptoms:  Mood swings.  Feeling restless, nervous, angry, or annoyed.  Trouble concentrating.  Dizziness.  Strong desire for high-sugar foods and nicotine.  Weight gain.  Trouble pooping (constipation).  Feeling like you may vomit (nausea).  Coughing or a sore throat.  Changes in how the medicines that you take for other issues work in your body.  Depression.  Trouble sleeping (insomnia). Week 3 and afterward After the first 2-3 weeks of quitting, you may start to notice more positive results, such as:  Better sense of smell and taste.  Less coughing and sore throat.  Slower heart rate.  Lower blood pressure.  Clearer skin.  Better breathing.  Fewer sick days. Quitting smoking can be hard. Do not give up if you fail the first time. Some people need to try a few times before they succeed. Do your best to stick to your quit plan, and talk with your doctor if you have any questions or concerns. Summary  Smoking tobacco is the leading cause of preventable death. Quitting smoking can be hard, but it is one of the best things that you can do for your health.  When you decide to quit smoking, make a plan to help you succeed.  Quit smoking right away, not slowly over a period of time.  When you start quitting, seek help from your doctor, family, or friends. This information is not intended to replace advice given to you by your health care provider. Make sure you discuss any questions you have with your health care provider. Document Revised: 02/03/2019 Document Reviewed: 07/30/2018 Elsevier Patient Education  2020 Elsevier Inc.  

## 2020-03-20 LAB — LIPID PANEL
Chol/HDL Ratio: 4.1 ratio (ref 0.0–4.4)
Cholesterol, Total: 272 mg/dL — ABNORMAL HIGH (ref 100–199)
HDL: 66 mg/dL (ref >39)
LDL Chol Calc (NIH): 167 mg/dL — ABNORMAL HIGH (ref 0–99)
Triglycerides: 213 mg/dL — ABNORMAL HIGH (ref 0–149)
VLDL Cholesterol Cal: 39 mg/dL (ref 5–40)

## 2020-03-20 LAB — CBC WITH DIFFERENTIAL/PLATELET
Basophils Absolute: 0.1 10*3/uL (ref 0.0–0.2)
Basos: 1 %
EOS (ABSOLUTE): 0.1 10*3/uL (ref 0.0–0.4)
Eos: 1 %
Hematocrit: 40.6 % (ref 34.0–46.6)
Hemoglobin: 13.9 g/dL (ref 11.1–15.9)
Immature Grans (Abs): 0.1 10*3/uL (ref 0.0–0.1)
Immature Granulocytes: 1 %
Lymphocytes Absolute: 3 10*3/uL (ref 0.7–3.1)
Lymphs: 31 %
MCH: 30.8 pg (ref 26.6–33.0)
MCHC: 34.2 g/dL (ref 31.5–35.7)
MCV: 90 fL (ref 79–97)
Monocytes Absolute: 0.7 10*3/uL (ref 0.1–0.9)
Monocytes: 7 %
Neutrophils Absolute: 5.6 10*3/uL (ref 1.4–7.0)
Neutrophils: 59 %
Platelets: 294 10*3/uL (ref 150–450)
RBC: 4.52 x10E6/uL (ref 3.77–5.28)
RDW: 13.1 % (ref 11.7–15.4)
WBC: 9.6 10*3/uL (ref 3.4–10.8)

## 2020-03-20 LAB — CMP14+EGFR
ALT: 11 IU/L (ref 0–32)
AST: 13 IU/L (ref 0–40)
Albumin/Globulin Ratio: 1.5 (ref 1.2–2.2)
Albumin: 4.2 g/dL (ref 3.8–4.8)
Alkaline Phosphatase: 63 IU/L (ref 44–121)
BUN/Creatinine Ratio: 18 (ref 12–28)
BUN: 16 mg/dL (ref 8–27)
Bilirubin Total: 0.2 mg/dL (ref 0.0–1.2)
CO2: 24 mmol/L (ref 20–29)
Calcium: 9.5 mg/dL (ref 8.7–10.3)
Chloride: 104 mmol/L (ref 96–106)
Creatinine, Ser: 0.9 mg/dL (ref 0.57–1.00)
GFR calc Af Amer: 79 mL/min/{1.73_m2} (ref >59)
GFR calc non Af Amer: 68 mL/min/{1.73_m2} (ref >59)
Globulin, Total: 2.8 g/dL (ref 1.5–4.5)
Glucose: 93 mg/dL (ref 65–99)
Potassium: 4.1 mmol/L (ref 3.5–5.2)
Sodium: 138 mmol/L (ref 134–144)
Total Protein: 7 g/dL (ref 6.0–8.5)

## 2020-08-29 DIAGNOSIS — K5732 Diverticulitis of large intestine without perforation or abscess without bleeding: Secondary | ICD-10-CM | POA: Insufficient documentation

## 2020-09-17 ENCOUNTER — Ambulatory Visit: Payer: Self-pay | Admitting: Nurse Practitioner

## 2020-09-18 ENCOUNTER — Encounter: Payer: Self-pay | Admitting: Nurse Practitioner

## 2020-11-12 ENCOUNTER — Ambulatory Visit (INDEPENDENT_AMBULATORY_CARE_PROVIDER_SITE_OTHER): Payer: Self-pay | Admitting: Nurse Practitioner

## 2020-11-12 ENCOUNTER — Other Ambulatory Visit: Payer: Self-pay

## 2020-11-12 ENCOUNTER — Encounter: Payer: Self-pay | Admitting: Nurse Practitioner

## 2020-11-12 VITALS — BP 168/84 | HR 63 | Temp 97.7°F | Resp 20 | Ht 66.0 in | Wt 196.0 lb

## 2020-11-12 DIAGNOSIS — Z720 Tobacco use: Secondary | ICD-10-CM

## 2020-11-12 DIAGNOSIS — Z6828 Body mass index (BMI) 28.0-28.9, adult: Secondary | ICD-10-CM

## 2020-11-12 DIAGNOSIS — I10 Essential (primary) hypertension: Secondary | ICD-10-CM

## 2020-11-12 DIAGNOSIS — F3341 Major depressive disorder, recurrent, in partial remission: Secondary | ICD-10-CM

## 2020-11-12 DIAGNOSIS — K2100 Gastro-esophageal reflux disease with esophagitis, without bleeding: Secondary | ICD-10-CM

## 2020-11-12 DIAGNOSIS — E876 Hypokalemia: Secondary | ICD-10-CM

## 2020-11-12 DIAGNOSIS — F419 Anxiety disorder, unspecified: Secondary | ICD-10-CM

## 2020-11-12 MED ORDER — ESCITALOPRAM OXALATE 20 MG PO TABS: 20.0000 mg | ORAL_TABLET | Freq: Every day | ORAL | 1 refills | Status: DC

## 2020-11-12 MED ORDER — LISINOPRIL 40 MG PO TABS: 40.0000 mg | ORAL_TABLET | Freq: Every day | ORAL | 3 refills | Status: DC

## 2020-11-12 NOTE — Progress Notes (Signed)
Subjective:    Patient ID: Theresa Reyes, female    DOB: 1955-09-07, 65 y.o.   MRN: [id number]   Chief Complaint: Medical Management of Chronic Issues    HPI:  1. Essential hypertension No c/o chest pain, sob or headache. Doe snot check blood pressure at home. BP Readings from Last 3 Encounters:  11/12/20 (!) 171/98  03/19/20 (!) 168/88  03/22/18 121/84     2. Gastroesophageal reflux disease with esophagitis without hemorrhage She use to be on protonix, but has no insurance so she switched to omeprazole OTC.  3. Hypokalemia No problems that aware of.  4. Anxiety Stays anxious. Lexapro helps some. GAD 7 : Generalized Anxiety Score 11/12/2020 03/19/2020 06/20/2019  Nervous, Anxious, on Edge 1 0 0  Control/stop worrying 1 0 0  Worry too much - different things 1 0 0  Trouble relaxing 1 0 0  Restless 1 0 0  Easily annoyed or irritable 1 1 0  Afraid - awful might happen 1 0 0  Total GAD 7 Score 7 1 0  Anxiety Difficulty Somewhat difficult Not difficult at all Not difficult at all       5. Recurrent major depressive disorder, in partial remission ([place]) Is on lexapro and is doing ok. SHe is the caregiver for her husband and that is very difficult. She has no insurance so doe snot want to change meds right now. Depression screen [facility] 2/9 11/12/2020 03/19/2020 06/20/2019  Decreased Interest 2 3 0  Down, Depressed, Hopeless 2 3 0  PHQ - 2 Score 4 6 0  Altered sleeping 3 3 -  Tired, decreased energy 2 1 -  Change in appetite 3 1 -  Feeling bad or failure about yourself  2 0 -  Trouble concentrating 0 0 -  Moving slowly or fidgety/restless 0 0 -  Suicidal thoughts 1 0 -  PHQ-9 Score 15 11 -  Difficult doing work/chores Somewhat difficult Not difficult at all -     6. Tobacco abuse Smokes over a pack a day  7. BMI 28.0-28.9,adult No recent weight changes Wt Readings from Last 3 Encounters:  11/12/20 196 lb (88.9 kg)  03/19/20 199 lb (90.3 kg)  03/22/18 178 lb  (80.7 kg)   BMI Readings from Last 3 Encounters:  11/12/20 31.64 kg/m  03/19/20 32.12 kg/m  03/22/18 28.73 kg/m       Outpatient Encounter Medications as of 11/12/2020  Medication Sig   escitalopram (LEXAPRO) 20 MG tablet Take 1 tablet (20 mg total) by mouth daily. (Needs to be seen before next refill)   lisinopril (ZESTRIL) 20 MG tablet Take 1 tablet (20 mg total) by mouth daily.   [DISCONTINUED] pantoprazole (PROTONIX) 40 MG tablet Take 1 tablet (40 mg total) by mouth daily.   No facility-administered encounter medications on file as of 11/12/2020.    Past Surgical History:  Procedure Laterality Date   CHOLECYSTECTOMY     ECTOPIC PREGNANCY SURGERY     OPEN PARTIAL HEPATECTOMY   2017    Family History  Problem Relation Age of Onset   Hypertension Mother    Colon cancer Father 52       Deceased age 66 from colon ca    New complaints: None  new today  Social history: Lives with her disabled husband. Her son lives their and helps  Controlled substance contract: n/a     Review of Systems  Constitutional:  Negative for diaphoresis.  Eyes:  Negative for pain.  Respiratory:  Negative for shortness of breath.   Cardiovascular:  Negative for chest pain, palpitations and leg swelling.  Gastrointestinal:  Negative for abdominal pain.  Endocrine: Negative for polydipsia.  Skin:  Negative for rash.  Neurological:  Negative for dizziness, weakness and headaches.  Hematological:  Does not bruise/bleed easily.  Psychiatric/Behavioral:  Positive for dysphoric mood. Negative for suicidal ideas. The patient is nervous/anxious.   All other systems reviewed and are negative.     Objective:   Physical Exam Vitals and nursing note reviewed.  Constitutional:      General: She is not in acute distress.    Appearance: Normal appearance. She is well-developed.  HENT:     Head: Normocephalic.     Right Ear: Tympanic membrane normal.     Left Ear: Tympanic membrane normal.      Nose: Nose normal.     Mouth/Throat:     Mouth: Mucous membranes are moist.  Eyes:     Pupils: Pupils are equal, round, and reactive to light.  Neck:     Vascular: No carotid bruit or JVD.  Cardiovascular:     Rate and Rhythm: Normal rate and regular rhythm.     Heart sounds: Normal heart sounds.  Pulmonary:     Effort: Pulmonary effort is normal. No respiratory distress.     Breath sounds: Normal breath sounds. No wheezing or rales.  Chest:     Chest wall: No tenderness.  Abdominal:     General: Bowel sounds are normal. There is no distension or abdominal bruit.     Palpations: Abdomen is soft. There is no hepatomegaly, splenomegaly, mass or pulsatile mass.     Tenderness: There is no abdominal tenderness.  Musculoskeletal:        General: Normal range of motion.     Cervical back: Normal range of motion and neck supple.  Lymphadenopathy:     Cervical: No cervical adenopathy.  Skin:    General: Skin is warm and dry.  Neurological:     Mental Status: She is alert and oriented to person, place, and time.     Deep Tendon Reflexes: Reflexes are normal and symmetric.  Psychiatric:        Behavior: Behavior normal.        Thought Content: Thought content normal.        Judgment: Judgment normal.     Comments: tearful    BP (!) 168/84   Pulse 63   Temp 97.7 F (36.5 C) (Temporal)   Resp 20   Ht 5' 6"  (1.676 m)   Wt 196 lb (88.9 kg)   SpO2 97%   BMI 31.64 kg/m          Assessment & Plan:  Theresa Reyes comes in today with chief complaint of Medical Management of Chronic Issues   Diagnosis and orders addressed:  1. Essential hypertension Low sodium diet - lisinopril (ZESTRIL) 40 MG tablet; Take 1 tablet (40 mg total) by mouth daily.  Dispense: 90 tablet; Refill: 3 - CMP14+EGFR  2. Gastroesophageal reflux disease with esophagitis without hemorrhage Avoid spicy foods Do not eat 2 hours prior to bedtime  3. Hypokalemia Labs pending  4. Anxiety Stress  management  5. Recurrent major depressive disorder, in partial remission ([facility]) - escitalopram (LEXAPRO) 20 MG tablet; Take 1 tablet (20 mg total) by mouth daily. (Needs to be seen before next refill)  Dispense: 90 tablet; Refill: 1  6. Tobacco abuse Smoking cessation  7. BMI 28.0-28.9,adult Discussed diet and  exercise for person with BMI >25 Will recheck weight in 3-6 months    Labs pending Health Maintenance reviewed Diet and exercise encouraged  Follow up plan: 6 months   Mary-Margaret Hassell Done, FNP

## 2020-11-12 NOTE — Patient Instructions (Signed)
Textbook of family medicine (9th ed., pp. 1062-1073). [place], [place]: [employer].">  Stress, Adult Stress is a normal reaction to life events. Stress is what you feel when life demands more than you are used to, or more than you think you can handle. Some stress can be useful, such as studying for a test or meeting a deadline at work. Stress that occurs too often or for too long can cause problems. It can affect your emotional health and interfere with relationships and normal daily activities. Too much stress can weaken your body's defense system (immune system) and increase your risk for physical illness. If you already have a medicalproblem, stress can make it worse. What are the causes? All sorts of life events can cause stress. An event that causes stress for one person may not be stressful for another person. Major life events, whether positive or negative, commonly cause stress. Examples include: Losing a job or starting a new job. Losing a loved one. Moving to a new town or home. Getting married or divorced. Having a baby. Getting injured or sick. Less obvious life events can also cause stress, especially if they occur day after day or in combination with each other. Examples include: Working long hours. Driving in traffic. Caring for children. Being in debt. Being in a difficult relationship. What are the signs or symptoms? Stress can cause emotional symptoms, including: Anxiety. This is feeling worried, afraid, on edge, overwhelmed, or out of control. Anger, including irritation or impatience. Depression. This is feeling sad, down, helpless, or guilty. Trouble focusing, remembering, or making decisions. Stress can cause physical symptoms, including: Aches and pains. These may affect your head, neck, back, stomach, or other areas of your body. Tight muscles or a clenched jaw. Low energy. Trouble sleeping. Stress can cause unhealthy behaviors, including: Eating to feel better  (overeating) or skipping meals. Working too much or putting off tasks. Smoking, drinking alcohol, or using drugs to feel better. How is this diagnosed? Stress is diagnosed through an assessment by your health care provider. He or she may diagnose this condition based on: Your symptoms and any stressful life events. Your medical history. Tests to rule out other causes of your symptoms. Depending on your condition, your health care provider may refer you to aspecialist for further evaluation. How is this treated?  Stress management techniques are the recommended treatment for stress. Medicineis not typically recommended for the treatment of stress. Techniques to reduce your reaction to stressful life events include: Stress identification. Monitor yourself for symptoms of stress and identify what causes stress for you. These skills may help you to avoid or prepare for stressful events. Time management. Set your priorities, keep a calendar of events, and learn to say no. Taking these actions can help you avoid making too many commitments. Techniques for coping with stress include: Rethinking the problem. Try to think realistically about stressful events rather than ignoring them or overreacting. Try to find the positives in a stressful situation rather than focusing on the negatives. Exercise. Physical exercise can release both physical and emotional tension. The key is to find a form of exercise that you enjoy and do it regularly. Relaxation techniques. These relax the body and mind. The key is to find one or more that you enjoy and use the techniques regularly. Examples include: Meditation, deep breathing, or progressive relaxation techniques. Yoga or tai chi. Biofeedback, mindfulness techniques, or journaling. Listening to music, being out in nature, or participating in other hobbies. Practicing a healthy lifestyle.   Eat a balanced diet, drink plenty of water, limit or avoid caffeine, and get  plenty of sleep. Having a strong support network. Spend time with family, friends, or other people you enjoy being around. Express your feelings and talk things over with someone you trust. Counseling or talk therapy with a mental health professional may be helpful if you are havingtrouble managing stress on your own. Follow these instructions at home: Lifestyle  Avoid drugs. Do not use any products that contain nicotine or tobacco, such as cigarettes, e-cigarettes, and chewing tobacco. If you need help quitting, ask your health care provider. Limit alcohol intake to no more than 1 drink a day for nonpregnant women and 2 drinks a day for men. One drink equals 12 oz of beer, 5 oz of wine, or 1 oz of hard liquor Do not use alcohol or drugs to relax. Eat a balanced diet that includes fresh fruits and vegetables, whole grains, lean meats, fish, eggs, and beans, and low-fat dairy. Avoid processed foods and foods high in added fat, sugar, and salt. Exercise at least 30 minutes on 5 or more days each week. Get 7-8 hours of sleep each night.  General instructions  Practice stress management techniques as discussed with your health care provider. Drink enough fluid to keep your urine clear or pale yellow. Take over-the-counter and prescription medicines only as told by your health care provider. Keep all follow-up visits as told by your health care provider. This is important.  Contact a health care provider if: Your symptoms get worse. You have new symptoms. You feel overwhelmed by your problems and can no longer manage them on your own. Get help right away if: You have thoughts of hurting yourself or others. If you ever feel like you may hurt yourself or others, or have thoughts about taking your own life, get help right away. You can go to your nearest emergency department or call: Your local emergency services (911 in the [place] A suicide crisis helpline, such as the [facility] at 4253100524. This is open 24 hours a day. Summary Stress is a normal reaction to life events. It can cause problems if it happens too often or for too long. Practicing stress management techniques is the best way to treat stress. Counseling or talk therapy with a mental health professional may be helpful if you are having trouble managing stress on your own. This information is not intended to replace advice given to you by your health care provider. Make sure you discuss any questions you have with your healthcare provider. Document Revised: 01/26/2020 Document Reviewed: 01/26/2020 Elsevier Patient Education  2022 [employer].

## 2020-11-13 LAB — CMP14+EGFR
ALT: 13 IU/L (ref 0–32)
AST: 15 IU/L (ref 0–40)
Albumin/Globulin Ratio: 1.9 (ref 1.2–2.2)
Albumin: 4.5 g/dL (ref 3.8–4.8)
Alkaline Phosphatase: 66 IU/L (ref 44–121)
BUN/Creatinine Ratio: 23 (ref 12–28)
BUN: 19 mg/dL (ref 8–27)
Bilirubin Total: <0.2 mg/dL (ref 0.0–1.2)
CO2: 22 mmol/L (ref 20–29)
Calcium: 9.7 mg/dL (ref 8.7–10.3)
Chloride: 101 mmol/L (ref 96–106)
Creatinine, Ser: 0.84 mg/dL (ref 0.57–1.00)
Globulin, Total: 2.4 g/dL (ref 1.5–4.5)
Glucose: 108 mg/dL — ABNORMAL HIGH (ref 65–99)
Potassium: 4.6 mmol/L (ref 3.5–5.2)
Sodium: 143 mmol/L (ref 134–144)
Total Protein: 6.9 g/dL (ref 6.0–8.5)
eGFR: 78 mL/min/{1.73_m2} (ref >59)

## 2021-05-07 ENCOUNTER — Other Ambulatory Visit: Payer: Self-pay | Admitting: Nurse Practitioner

## 2021-05-07 DIAGNOSIS — F3341 Major depressive disorder, recurrent, in partial remission: Secondary | ICD-10-CM

## 2021-05-17 ENCOUNTER — Other Ambulatory Visit: Payer: Self-pay | Admitting: Nurse Practitioner

## 2021-05-17 DIAGNOSIS — K2100 Gastro-esophageal reflux disease with esophagitis, without bleeding: Secondary | ICD-10-CM

## 2021-05-23 ENCOUNTER — Other Ambulatory Visit: Payer: Self-pay | Admitting: Family

## 2021-05-23 DIAGNOSIS — K2100 Gastro-esophageal reflux disease with esophagitis, without bleeding: Secondary | ICD-10-CM

## 2021-05-23 MED ORDER — PANTOPRAZOLE SODIUM 40 MG PO TBEC: 40.0000 mg | DELAYED_RELEASE_TABLET | Freq: Every day | ORAL | 1 refills | Status: DC

## 2021-06-09 ENCOUNTER — Encounter: Payer: Self-pay | Admitting: Nurse Practitioner

## 2021-06-09 ENCOUNTER — Ambulatory Visit (INDEPENDENT_AMBULATORY_CARE_PROVIDER_SITE_OTHER): Payer: Medicare HMO | Admitting: Nurse Practitioner

## 2021-06-09 DIAGNOSIS — R197 Diarrhea, unspecified: Secondary | ICD-10-CM

## 2021-06-09 DIAGNOSIS — R1110 Vomiting, unspecified: Secondary | ICD-10-CM

## 2021-06-09 NOTE — Patient Instructions (Signed)
Vomiting, Adult Vomiting is when stomach contents forcefully come out of the mouth. Many people notice nausea before vomiting. Vomiting can make you feel weak and cause you to become dehydrated. Dehydration can make you feel tired and thirsty, cause you to have a dry mouth, and decrease how often you urinate. Older adults and people who have other diseases or a weak body defense system (immune system) are at higher risk for dehydration. It is important to treat vomiting as told by your health care provider. Follow these instructions at home: Watch your symptoms for any changes. Tell your health care provider about them. Eating and drinking   Follow these recommendations as told by your health care provider: Take an oral rehydration solution (ORS). This is a drink that is sold at pharmacies and retail stores. Eat bland, easy-to-digest foods in small amounts as you are able. These foods include bananas, applesauce, rice, lean meats, toast, and crackers. Drink clear fluids slowly and in small amounts as you are able. Clear fluids include water, ice chips, low-calorie sports drinks, and fruit juice that has water added (diluted fruit juice). Avoid drinking fluids that contain a lot of sugar or caffeine, such as energy drinks, sports drinks, and soda. Avoid alcohol. Avoid spicy or fatty foods.  General instructions Wash your hands often using soap and water for at least 20 seconds. If soap and water are not available, use hand sanitizer. Make sure that everyone in your household washes their hands frequently. Take over-the-counter and prescription medicines only as told by your health care provider. Rest at home while you recover. Watch your condition for any changes. Keep all follow-up visits. This is important. Contact a health care provider if: Your vomiting gets worse. You have new symptoms. You have a fever. You cannot drink fluids without vomiting. You feel light-headed or dizzy. You  have a headache. You have muscle cramps. You have a rash. You have pain while urinating. Get help right away if: You have pain in your chest, neck, arm, or jaw. Your heart is beating very quickly. You have trouble breathing or you are breathing very quickly. You feel extremely weak or you faint. Your skin feels cold and clammy. You feel confused. You have persistent vomiting. You have vomit that is bright red or looks like black coffee grounds. You have stools (feces) that are bloody or black, or stools that look like tar. You have a severe headache, a stiff neck, or both. You have severe pain, cramping, or bloating in your abdomen. You have signs of dehydration, such as: Dark urine, very little urine, or no urine. Cracked lips. Dry mouth. Sunken eyes. Sleepiness. Weakness. These symptoms may be an emergency. Get help right away. Call 911. Do not wait to see if the symptoms will go away. Do not drive yourself to the hospital. Summary Vomiting is when stomach contents forcefully come out of the mouth. Vomiting can cause you to become dehydrated. It is important to treat vomiting as told by your health care provider. Follow your health care provider's instructions about eating and drinking. Wash your hands often using soap and water for at least 20 seconds. If soap and water are not available, use hand sanitizer. Watch your condition for any changes and for signs of dehydration. Keep all follow-up visits. This is important. This information is not intended to replace advice given to you by your health care provider. Make sure you discuss any questions you have with your health care provider. Document Revised: 11/15/2020  Document Reviewed: 11/15/2020 Elsevier Patient Education  [employer].

## 2021-06-09 NOTE — Progress Notes (Signed)
° °  Virtual Visit  Note Due to COVID-19 pandemic this visit was conducted virtually. This visit type was conducted due to national recommendations for restrictions regarding the COVID-19 Pandemic (e.g. social distancing, sheltering in place) in an effort to limit this patient's exposure and mitigate transmission in our community. All issues noted in this document were discussed and addressed.  A physical exam was not performed with this format.  I connected with Theresa Reyes on 06/09/21 at 7:59 by telephone and verified that I am speaking with the correct person using two identifiers. Theresa Reyes is currently located at home and her husband is currently with her during visit. The provider, Mary-Margaret Hassell Done, FNP is located in their office at time of visit.  I discussed the limitations, risks, security and privacy concerns of performing an evaluation and management service by telephone and the availability of in person appointments. I also discussed with the patient that there may be a patient responsible charge related to this service. The patient expressed understanding and agreed to proceed.   History and Present Illness:  Patient was actually scheduled for an annual physical today but is sick.  Emesis  This is a new problem. The current episode started in the past 7 days. The problem occurs 5 to 10 times per day. The problem has been gradually worsening. The emesis has an appearance of stomach contents. There has been no fever. Associated symptoms include diarrhea. She has tried increased fluids for the symptoms. The treatment provided mild relief. Took some of her husbands zofran this morning    Review of Systems  Gastrointestinal:  Positive for diarrhea and vomiting.    Observations/Objective: Alert and oriented- answers all questions appropriately No distress Heavy breathing because feels like she is going to vomit  Assessment and Plan: Theresa Reyes in today with chief  complaint of vomiting and diarrhea  1. Vomiting and diarrhea Force fluids  Bland diet when can tolerate Zofran as previously prescribed Call if not improving If not voiding- need to go to the ED   Follow Up Instructions: prn    I discussed the assessment and treatment plan with the patient. The patient was provided an opportunity to ask questions and all were answered. The patient agreed with the plan and demonstrated an understanding of the instructions.   The patient was advised to call back or seek an in-person evaluation if the symptoms worsen or if the condition fails to improve as anticipated.  The above assessment and management plan was discussed with the patient. The patient verbalized understanding of and has agreed to the management plan. Patient is aware to call the clinic if symptoms persist or worsen. Patient is aware when to return to the clinic for a follow-up visit. Patient educated on when it is appropriate to go to the emergency department.   Time call ended:  8:12  I provided 13 minutes of  non face-to-face time during this encounter.    Mary-Margaret Hassell Done, FNP

## 2021-06-30 ENCOUNTER — Ambulatory Visit (INDEPENDENT_AMBULATORY_CARE_PROVIDER_SITE_OTHER): Payer: Medicare HMO | Admitting: Nurse Practitioner

## 2021-06-30 ENCOUNTER — Encounter: Payer: Self-pay | Admitting: Nurse Practitioner

## 2021-06-30 VITALS — BP 184/86 | HR 52 | Temp 97.7°F | Resp 20 | Ht 66.0 in | Wt 194.0 lb

## 2021-06-30 DIAGNOSIS — K2100 Gastro-esophageal reflux disease with esophagitis, without bleeding: Secondary | ICD-10-CM | POA: Diagnosis not present

## 2021-06-30 DIAGNOSIS — F419 Anxiety disorder, unspecified: Secondary | ICD-10-CM

## 2021-06-30 DIAGNOSIS — F3341 Major depressive disorder, recurrent, in partial remission: Secondary | ICD-10-CM | POA: Diagnosis not present

## 2021-06-30 DIAGNOSIS — Z6828 Body mass index (BMI) 28.0-28.9, adult: Secondary | ICD-10-CM

## 2021-06-30 DIAGNOSIS — E876 Hypokalemia: Secondary | ICD-10-CM

## 2021-06-30 DIAGNOSIS — K625 Hemorrhage of anus and rectum: Secondary | ICD-10-CM

## 2021-06-30 DIAGNOSIS — I10 Essential (primary) hypertension: Secondary | ICD-10-CM

## 2021-06-30 DIAGNOSIS — Z720 Tobacco use: Secondary | ICD-10-CM

## 2021-06-30 DIAGNOSIS — R42 Dizziness and giddiness: Secondary | ICD-10-CM

## 2021-06-30 MED ORDER — PANTOPRAZOLE SODIUM 40 MG PO TBEC: 40.0000 mg | DELAYED_RELEASE_TABLET | Freq: Every day | ORAL | 1 refills | Status: DC

## 2021-06-30 MED ORDER — AMLODIPINE BESYLATE 5 MG PO TABS: 5.0000 mg | ORAL_TABLET | Freq: Every day | ORAL | 1 refills | Status: DC

## 2021-06-30 MED ORDER — HYDROXYZINE HCL 10 MG PO TABS: 10.0000 mg | ORAL_TABLET | Freq: Three times a day (TID) | ORAL | 0 refills | Status: DC | PRN

## 2021-06-30 MED ORDER — ESCITALOPRAM OXALATE 20 MG PO TABS: 20.0000 mg | ORAL_TABLET | Freq: Every day | ORAL | 1 refills | Status: DC

## 2021-06-30 NOTE — Progress Notes (Signed)
Subjective:    Patient ID: Theresa Reyes, female    DOB: Sep 23, 1955, 66 y.o.   MRN: [id number]  Chief Complaint: medical management of chronic issues     HPI:  Theresa Reyes is a 66 y.o. who identifies as a female who was assigned female at birth.   Social history: Lives with: husband Work history: retired   Scientist, forensic in today for follow up of the following chronic medical issues:  1. Essential hypertension No c/o chest pain, sob or headaches. Does check blood pressure at home 948'A systolic. She use to be on lisinoril but that causes vertigo so she stopped taking it. BP Readings from Last 3 Encounters:  06/30/21 (!) 184/86  11/12/20 (!) 168/84  03/19/20 (!) 168/88     2. Hypokalemia No c/o cramps Lab Results  Component Value Date   K 4.6 11/12/2020     3. Gastroesophageal reflux disease with esophagitis without hemorrhage Is on protonix and she takes it daily. Has symptoms f does not take.  4. Recurrent major depressive disorder, in partial remission ([place]) Is on lexapro an dis doing well. Depression screen [facility] 2/9 06/30/2021 11/12/2020 03/19/2020  Decreased Interest 1 2 3   Down, Depressed, Hopeless 1 2 3   PHQ - 2 Score 2 4 6   Altered sleeping 2 3 3   Tired, decreased energy 3 2 1   Change in appetite 3 3 1   Feeling bad or failure about yourself  1 2 0  Trouble concentrating 0 0 0  Moving slowly or fidgety/restless 0 0 0  Suicidal thoughts 0 1 0  PHQ-9 Score 11 15 11   Difficult doing work/chores Somewhat difficult Somewhat difficult Not difficult at all  Some recent data might be hidden     5. Anxiety Stays anxious. Worries a lot. GAD 7 : Generalized Anxiety Score 06/30/2021 11/12/2020 03/19/2020 06/20/2019  Nervous, Anxious, on Edge 0 1 0 0  Control/stop worrying 0 1 0 0  Worry too much - different things 0 1 0 0  Trouble relaxing 0 1 0 0  Restless 0 1 0 0  Easily annoyed or irritable 2 1 1  0  Afraid - awful might happen 0 1 0 0  Total GAD 7 Score 2 7 1  0   Anxiety Difficulty Somewhat difficult Somewhat difficult Not difficult at all Not difficult at all      6. Tobacco abuse Smokes over a pack a day  7. BMI 28.0-28.9,adult No recent weight changes Wt Readings from Last 3 Encounters:  06/30/21 194 lb (88 kg)  11/12/20 196 lb (88.9 kg)  03/19/20 199 lb (90.3 kg)   BMI Readings from Last 3 Encounters:  06/30/21 31.31 kg/m  11/12/20 31.64 kg/m  03/19/20 32.12 kg/m      New complaints: - has vertigo - moving her head in certain directions causes it  to be worse. - needs colonoscopy  No Known Allergies Outpatient Encounter Medications as of 06/30/2021  Medication Sig   escitalopram (LEXAPRO) 20 MG tablet Take 1 tablet (20 mg total) by mouth daily.   lisinopril (ZESTRIL) 40 MG tablet Take 1 tablet (40 mg total) by mouth daily.   pantoprazole (PROTONIX) 40 MG tablet Take 1 tablet (40 mg total) by mouth daily.   No facility-administered encounter medications on file as of 06/30/2021.    Past Surgical History:  Procedure Laterality Date   CHOLECYSTECTOMY     ECTOPIC PREGNANCY SURGERY     OPEN PARTIAL HEPATECTOMY   2017    Family History  Problem Relation Age of Onset   Hypertension Mother    Colon cancer Father 74       Deceased age 33 from colon ca      Controlled substance contract: n/a     Review of Systems  Constitutional:  Negative for diaphoresis.  Eyes:  Negative for pain.  Respiratory:  Negative for shortness of breath.   Cardiovascular:  Negative for chest pain, palpitations and leg swelling.  Gastrointestinal:  Negative for abdominal pain.  Endocrine: Negative for polydipsia.  Skin:  Negative for rash.  Neurological:  Negative for dizziness, weakness and headaches.  Hematological:  Does not bruise/bleed easily.  All other systems reviewed and are negative.     Objective:   Physical Exam Vitals and nursing note reviewed.  Constitutional:      General: She is not in acute distress.     Appearance: Normal appearance. She is well-developed.  HENT:     Head: Normocephalic.     Right Ear: Tympanic membrane normal.     Left Ear: Tympanic membrane normal.     Nose: Nose normal.     Mouth/Throat:     Mouth: Mucous membranes are moist.  Eyes:     Pupils: Pupils are equal, round, and reactive to light.  Neck:     Vascular: No carotid bruit or JVD.  Cardiovascular:     Rate and Rhythm: Normal rate and regular rhythm.     Heart sounds: Normal heart sounds.  Pulmonary:     Effort: Pulmonary effort is normal. No respiratory distress.     Breath sounds: Normal breath sounds. No wheezing or rales.  Chest:     Chest wall: No tenderness.  Abdominal:     General: Bowel sounds are normal. There is no distension or abdominal bruit.     Palpations: Abdomen is soft. There is no hepatomegaly, splenomegaly, mass or pulsatile mass.     Tenderness: There is no abdominal tenderness.  Musculoskeletal:        General: Normal range of motion.     Cervical back: Normal range of motion and neck supple.  Lymphadenopathy:     Cervical: No cervical adenopathy.  Skin:    General: Skin is warm and dry.  Neurological:     Mental Status: She is alert and oriented to person, place, and time.     Deep Tendon Reflexes: Reflexes are normal and symmetric.  Psychiatric:        Behavior: Behavior normal.        Thought Content: Thought content normal.        Judgment: Judgment normal.    BP (!) 184/86    Pulse (!) 52    Temp 97.7 F (36.5 C) (Temporal)    Resp 20    Ht 5' 6"  (1.676 m)    Wt 194 lb (88 kg)    SpO2 100%    BMI 31.31 kg/m        Assessment & Plan:  Theresa Reyes comes in today with chief complaint of Medical Management of Chronic Issues   Diagnosis and orders addressed:  1. Essential hypertension Low sodium diet - amLODipine (NORVASC) 5 MG tablet; Take 1 tablet (5 mg total) by mouth daily.  Dispense: 90 tablet; Refill: 1 - CBC with Differential/Platelet - CMP14+EGFR -  Lipid panel  2. Hypokalemia Labs pending  3. Gastroesophageal reflux disease with esophagitis without hemorrhage Avoid spicy foods Do not eat 2 hours prior to bedtime  4. Recurrent major depressive disorder,  in partial remission [facility]) Stress management Continue lexapro as prescribed  5. Anxiety - hydrOXYzine (ATARAX) 10 MG tablet; Take 1 tablet (10 mg total) by mouth 3 (three) times daily as needed.  Dispense: 30 tablet; Refill: 0  6. Tobacco abuse Smoking cessation encouraged  7. BMI 28.0-28.9,adult Discussed diet and exercise for person with BMI >25 Will recheck weight in 3-6 months   8. Rectal bleeding - Ambulatory referral to Gastroenterology  9. Vertigo Force fluids - Ambulatory referral to Neurology - Thyroid Panel With TSH   Labs pending Health Maintenance reviewed Diet and exercise encouraged  Follow up plan: 6 months   Mary-Margaret Hassell Done, FNP

## 2021-06-30 NOTE — Patient Instructions (Signed)
Vertigo Vertigo is the feeling that you or the things around you are moving when they are not. This feeling can come and go at any time. Vertigo often goes away on its own. This condition can be dangerous if it happens when you are doingactivities like driving or working with machines. Your doctor will do tests to find the cause of your vertigo. These tests willalso help your doctor decide on the best treatment for you. Follow these instructions at home: Eating and drinking     Drink enough fluid to keep your pee (urine) pale yellow. Do not drink alcohol. Activity Return to your normal activities when your doctor says that it is safe. In the morning, first sit up on the side of the bed. When you feel okay, stand slowly while you hold onto something until you know that your balance is fine. Move slowly. Avoid sudden body or head movements or certain positions, as told by your doctor. Use a cane if you have trouble standing or walking. Sit down right away if you feel dizzy. Avoid doing any tasks or activities that can cause danger to you or others if you get dizzy. Avoid bending down if you feel dizzy. Place items in your home so that they are easy for you to reach without bending or leaning over. Do not drive or use machinery if you feel dizzy. General instructions Take over-the-counter and prescription medicines only as told by your doctor. Keep all follow-up visits. Contact a doctor if: Your medicine does not help your vertigo. Your problems get worse or you have new symptoms. You have a fever. You feel like you may vomit (nauseous), or this feeling gets worse. You start to vomit. Your family or friends see changes in how you act. You lose feeling (have numbness) in part of your body. You feel prickling and tingling in a part of your body. Get help right away if: You are always dizzy. You faint. You get very bad headaches. You get a stiff neck. Bright light starts to bother  you. You have trouble moving or talking. You feel weak in your hands, arms, or legs. You have changes in your hearing or in how you see (vision). These symptoms may be an emergency. Get help right away. Call your local emergency services (911 in the [place] Do not wait to see if the symptoms will go away. Do not drive yourself to the hospital. Summary Vertigo is the feeling that you or the things around you are moving when they are not. Your doctor will do tests to find the cause of your vertigo. You may be told to avoid some tasks, positions, or movements. Contact a doctor if your medicine is not helping, or if you have a fever, new symptoms, or a change in how you act. Get help right away if you get very bad headaches, or if you have changes in how you speak, hear, or see. This information is not intended to replace advice given to you by your health care provider. Make sure you discuss any questions you have with your healthcare provider. Document Revised: 04/10/2020 Document Reviewed: 04/10/2020 Elsevier Patient Education  2022 [employer] Inc[employer]  

## 2021-07-01 ENCOUNTER — Other Ambulatory Visit: Payer: Self-pay | Admitting: Nurse Practitioner

## 2021-07-01 ENCOUNTER — Other Ambulatory Visit: Payer: Self-pay

## 2021-07-01 DIAGNOSIS — Z1231 Encounter for screening mammogram for malignant neoplasm of breast: Secondary | ICD-10-CM

## 2021-07-01 LAB — CBC WITH DIFFERENTIAL/PLATELET
Basophils Absolute: 0.1 10*3/uL (ref 0.0–0.2)
Basos: 1 %
EOS (ABSOLUTE): 0.1 10*3/uL (ref 0.0–0.4)
Eos: 2 %
Hematocrit: 45.5 % (ref 34.0–46.6)
Hemoglobin: 15 g/dL (ref 11.1–15.9)
Immature Grans (Abs): 0 10*3/uL (ref 0.0–0.1)
Immature Granulocytes: 0 %
Lymphocytes Absolute: 2.9 10*3/uL (ref 0.7–3.1)
Lymphs: 34 %
MCH: 29.7 pg (ref 26.6–33.0)
MCHC: 33 g/dL (ref 31.5–35.7)
MCV: 90 fL (ref 79–97)
Monocytes Absolute: 0.6 10*3/uL (ref 0.1–0.9)
Monocytes: 7 %
Neutrophils Absolute: 4.7 10*3/uL (ref 1.4–7.0)
Neutrophils: 56 %
Platelets: 301 10*3/uL (ref 150–450)
RBC: 5.05 x10E6/uL (ref 3.77–5.28)
RDW: 13 % (ref 11.7–15.4)
WBC: 8.3 10*3/uL (ref 3.4–10.8)

## 2021-07-01 LAB — CMP14+EGFR
ALT: 12 IU/L (ref 0–32)
AST: 17 IU/L (ref 0–40)
Albumin/Globulin Ratio: 1.8 (ref 1.2–2.2)
Albumin: 4.6 g/dL (ref 3.8–4.8)
Alkaline Phosphatase: 70 IU/L (ref 44–121)
BUN/Creatinine Ratio: 15 (ref 12–28)
BUN: 13 mg/dL (ref 8–27)
Bilirubin Total: 0.3 mg/dL (ref 0.0–1.2)
CO2: 22 mmol/L (ref 20–29)
Calcium: 9.7 mg/dL (ref 8.7–10.3)
Chloride: 103 mmol/L (ref 96–106)
Creatinine, Ser: 0.89 mg/dL (ref 0.57–1.00)
Globulin, Total: 2.5 g/dL (ref 1.5–4.5)
Glucose: 88 mg/dL (ref 70–99)
Potassium: 5.1 mmol/L (ref 3.5–5.2)
Sodium: 138 mmol/L (ref 134–144)
Total Protein: 7.1 g/dL (ref 6.0–8.5)
eGFR: 72 mL/min/{1.73_m2} (ref >59)

## 2021-07-01 LAB — LIPID PANEL
Chol/HDL Ratio: 4.5 ratio — ABNORMAL HIGH (ref 0.0–4.4)
Cholesterol, Total: 283 mg/dL — ABNORMAL HIGH (ref 100–199)
HDL: 63 mg/dL (ref >39)
LDL Chol Calc (NIH): 188 mg/dL — ABNORMAL HIGH (ref 0–99)
Triglycerides: 172 mg/dL — ABNORMAL HIGH (ref 0–149)
VLDL Cholesterol Cal: 32 mg/dL (ref 5–40)

## 2021-07-01 LAB — THYROID PANEL WITH TSH
Free Thyroxine Index: 1.5 (ref 1.2–4.9)
T3 Uptake Ratio: 24 % (ref 24–39)
T4, Total: 6.3 ug/dL (ref 4.5–12.0)
TSH: 1.68 u[IU]/mL (ref 0.450–4.500)

## 2021-07-02 ENCOUNTER — Other Ambulatory Visit: Payer: Self-pay | Admitting: Nurse Practitioner

## 2021-07-02 MED ORDER — ROSUVASTATIN CALCIUM 10 MG PO TABS: 10.0000 mg | ORAL_TABLET | Freq: Every day | ORAL | 1 refills | Status: DC

## 2021-07-02 NOTE — Progress Notes (Signed)
Crestor 10

## 2021-07-15 ENCOUNTER — Ambulatory Visit (INDEPENDENT_AMBULATORY_CARE_PROVIDER_SITE_OTHER): Payer: Medicare HMO | Admitting: Nurse Practitioner

## 2021-07-15 ENCOUNTER — Encounter: Payer: Self-pay | Admitting: Nurse Practitioner

## 2021-07-15 VITALS — BP 135/76 | HR 56 | Temp 97.5°F | Ht 66.0 in | Wt 192.2 lb

## 2021-07-15 DIAGNOSIS — Z0000 Encounter for general adult medical examination without abnormal findings: Secondary | ICD-10-CM

## 2021-07-15 NOTE — Patient Instructions (Signed)

## 2021-07-15 NOTE — Progress Notes (Signed)
Subjective:    Theresa Reyes is a 66 y.o. female who presents for a Welcome to Medicare exam.   Review of Systems Review of Systems  Constitutional:  Negative for diaphoresis and weight loss.  Eyes:  Negative for blurred vision, double vision and pain.  Respiratory:  Negative for shortness of breath.   Cardiovascular:  Negative for chest pain, palpitations, orthopnea and leg swelling.  Gastrointestinal:  Negative for abdominal pain.  Skin:  Negative for rash.  Neurological:  Negative for dizziness, sensory change, loss of consciousness, weakness and headaches.  Endo/Heme/Allergies:  Negative for polydipsia. Does not bruise/bleed easily.  Psychiatric/Behavioral:  Positive for depression. Negative for memory loss. The patient does not have insomnia.   All other systems reviewed and are negative.  Cardiac Risk Factors include: advanced age (>51men, >65 women);smoking/ tobacco exposure;sedentary lifestyle;obesity (BMI >30kg/m2)      Objective:    Today's Vitals   07/15/21 1107  BP: 135/76  Pulse: (!) 56  Temp: (!) 97.5 F (36.4 C)  TempSrc: Temporal  Weight: 192 lb 4 oz (87.2 kg)  Height: 5\' 6"  (1.676 m)  PainSc: 4   Body mass index is 31.03 kg/m.  Medications Outpatient Encounter Medications as of 07/15/2021  Medication Sig   amLODipine (NORVASC) 5 MG tablet Take 1 tablet (5 mg total) by mouth daily.   escitalopram (LEXAPRO) 20 MG tablet Take 1 tablet (20 mg total) by mouth daily.   pantoprazole (PROTONIX) 40 MG tablet Take 1 tablet (40 mg total) by mouth daily.   rosuvastatin (CRESTOR) 10 MG tablet Take 1 tablet (10 mg total) by mouth daily.   hydrOXYzine (ATARAX) 10 MG tablet Take 1 tablet (10 mg total) by mouth 3 (three) times daily as needed. (Patient not taking: Reported on 07/15/2021)   No facility-administered encounter medications on file as of 07/15/2021.     History: Past Medical History:  Diagnosis Date   ADD (attention deficit disorder)    adult    Depression    Liver mass    S/P resection and partial hepatioma 2017 at [facility]   Past Surgical History:  Procedure Laterality Date   CHOLECYSTECTOMY     ECTOPIC PREGNANCY SURGERY     OPEN PARTIAL HEPATECTOMY   2017    Family History  Problem Relation Age of Onset   Hypertension Mother    Colon cancer Father 92       Deceased age 76 from colon ca   Social History   Occupational History   Not on file  Tobacco Use   Smoking status: Every Day    Packs/day: 1.00    Years: 50.00    Pack years: 50.00    Types: Cigarettes   Smokeless tobacco: Never  Vaping Use   Vaping Use: Never used  Substance and Sexual Activity   Alcohol use: Yes    Alcohol/week: 14.0 standard drinks    Types: 14 Glasses of wine per week    Comment: Occasional/socially   Drug use: Yes    Frequency: 7.0 times per week    Types: Marijuana   Sexual activity: Not Currently    Tobacco Counseling Ready to quit: Not Answered Counseling given: Not Answered   Immunizations and Health Maintenance Immunization History  Administered Date(s) Administered   Influenza Split 07/04/2015   Influenza,inj,Quad PF,6+ Mos 03/23/2018   Moderna Sars-Covid-2 Vaccination 08/08/2019, 09/05/2019, 05/17/2020   PPD Test 07/15/2017   Health Maintenance Due  Topic Date Due   COLONOSCOPY (Pts 45-32yrs Insurance coverage will  need to be confirmed)  Never done   DEXA SCAN  Never done    Activities of Daily Living In your present state of health, do you have any difficulty performing the following activities: 07/15/2021  Hearing? N  Vision? N  Difficulty concentrating or making decisions? N  Walking or climbing stairs? N  Dressing or bathing? N  Doing errands, shopping? N  Preparing Food and eating ? N  Using the Toilet? N  In the past six months, have you accidently leaked urine? N  Do you have problems with loss of bowel control? N  Managing your Medications? N  Managing your Finances? N  Housekeeping or managing  your Housekeeping? N  Some recent data might be hidden    Physical Exam EKG- NSR (optional), or other factors deemed appropriate based on the beneficiary's medical and social history and current clinical standards.  Advanced Directives: Does Patient Have a Medical Advance Directive?: No Would patient like information on creating a medical advance directive?: No - Patient declined    Assessment:    This is a routine wellness examination for this patient .   Vision/Hearing screen No results found.  Dietary issues and exercise activities discussed:  Current Exercise Habits: The patient does not participate in regular exercise at present, Exercise limited by: None identified   Goals      DIET - INCREASE WATER INTAKE       Depression Screen [facility] 2/9 Scores 07/15/2021 06/30/2021 11/12/2020 03/19/2020  PHQ - 2 Score 0 2 4 6   PHQ- 9 Score 5 11 15 11      Fall Risk Fall Risk  07/15/2021  Falls in the past year? 0    Cognitive Function: no issues        Patient Care Team: Chevis Pretty, FNP as PCP - General (Nurse Practitioner) Daneil Dolin, MD as Consulting Physician (Gastroenterology)     Plan:   Welcome to medicare  I have personally reviewed and noted the following in the patients chart:   Medical and social history Use of alcohol, tobacco or illicit drugs  Current medications and supplements Functional ability and status Nutritional status Physical activity Advanced directives List of other physicians Hospitalizations, surgeries, and ER visits in previous 12 months Vitals Screenings to include cognitive, depression, and falls Referrals and appointments  In addition, I have reviewed and discussed with patient certain preventive protocols, quality metrics, and best practice recommendations. A written personalized care plan for preventive services as well as general preventive health recommendations were provided to patient.     Hodgkins,  [place] 07/15/2021

## 2021-07-15 NOTE — Addendum Note (Signed)
Addended by: Chevis Pretty on: 07/15/2021 12:32 PM   Modules accepted: Orders

## 2021-07-16 ENCOUNTER — Ambulatory Visit
Admission: RE | Admit: 2021-07-16 | Discharge: 2021-07-16 | Disposition: A | Discharge status: Home / Self Care | Payer: Medicare HMO | Source: Ambulatory Visit | Attending: Nurse Practitioner | Admitting: Nurse Practitioner

## 2021-07-16 ENCOUNTER — Other Ambulatory Visit: Payer: Self-pay

## 2021-07-16 DIAGNOSIS — Z1231 Encounter for screening mammogram for malignant neoplasm of breast: Secondary | ICD-10-CM

## 2021-07-18 ENCOUNTER — Other Ambulatory Visit: Payer: Self-pay | Admitting: Nurse Practitioner

## 2021-07-18 DIAGNOSIS — R928 Other abnormal and inconclusive findings on diagnostic imaging of breast: Secondary | ICD-10-CM

## 2021-07-31 ENCOUNTER — Other Ambulatory Visit: Payer: Medicare HMO

## 2021-08-02 DIAGNOSIS — K5792 Diverticulitis of intestine, part unspecified, without perforation or abscess without bleeding: Secondary | ICD-10-CM | POA: Insufficient documentation

## 2021-08-06 ENCOUNTER — Telehealth: Payer: Self-pay

## 2021-08-06 NOTE — Telephone Encounter (Signed)
Transition Care Management Unsuccessful Follow-up Telephone Call ? ?Date of discharge and from where:  08/05/21 - [facility] - diverticulitis ? ?Attempts:  1st Attempt ? ?Reason for unsuccessful TCM follow-up call:  Left voice message ? ? ?Transition Care Management Unsuccessful Follow-up Telephone Call ? ?Date of discharge and from where:  08/05/21 - [facility] - diverticulitis  ? ?Attempts:  2nd Attempt ? ?Reason for unsuccessful TCM follow-up call:  Left voice message ? ?  ? ?  ?

## 2021-08-07 NOTE — Telephone Encounter (Signed)
Transition Care Management Follow-up Telephone Call ?Date of discharge and from where: Theresa Reyes - 08/05/21 - Diverticulitis ?How have you been since you were released from the hospital? better ?Any questions or concerns? No ? ?Items Reviewed: ?Did the pt receive and understand the discharge instructions provided? Yes  ?Medications obtained and verified? Yes  ?Other? No  ?Any new allergies since your discharge? No  ?Dietary orders reviewed? Yes ?Do you have support at home? Yes  ? ?Home Care and Equipment/Supplies: ?Were home health services ordered? no ? ?Were any new equipment or medical supplies ordered?  No ? ?Functional Questionnaire: (I = Independent and D = Dependent) ?ADLs: I ? ?Bathing/Dressing- I ? ?Meal Prep- I ? ?Eating- I ? ?Maintaining continence- I ? ?Transferring/Ambulation- I ? ?Managing Meds- I ? ?Follow up appointments reviewed: ? ?PCP Hospital f/u appt confirmed? Yes  Scheduled to see Chevis Pretty on 08/18/21 @ 12:15. ?[facility] f/u appt confirmed? Yes  Scheduled to see Hochrein on 08/13/21 @ 3. ?Are transportation arrangements needed? No  ?If their condition worsens, is the pt aware to call PCP or go to the Emergency Dept.? Yes ?Was the patient provided with contact information for the PCP's office or ED? Yes ?Was to pt encouraged to call back with questions or concerns? Yes  ?

## 2021-08-11 NOTE — Progress Notes (Deleted)
?  ?Cardiology Office Note ? ? ?Date:  08/11/2021  ? ?ID:  Theresa Reyes, DOB 06-17-1955, MRN [id number] ? ?PCP:  Chevis Pretty, FNP  ?Cardiologist:   None ?Referring:  *** ? ?No chief complaint on file. ? ? ?  ?History of Present Illness: ?Theresa Reyes is a 66 y.o. female who is referred by *** for evaluation of bradycardia.  ***  ? ? ?Past Medical History:  ?Diagnosis Date  ? ADD (attention deficit disorder)   ? adult  ? Depression   ? Liver mass   ? S/P resection and partial hepatioma 2017 at [facility]  ? ? ?Past Surgical History:  ?Procedure Laterality Date  ? CHOLECYSTECTOMY    ? ECTOPIC PREGNANCY SURGERY    ? OPEN PARTIAL HEPATECTOMY   2017  ? ? ? ?Current Outpatient Medications  ?Medication Sig Dispense Refill  ? acetaminophen (TYLENOL) 500 MG tablet Take by mouth.    ? amLODipine (NORVASC) 5 MG tablet Take 1 tablet (5 mg total) by mouth daily. 90 tablet 1  ? amoxicillin-clavulanate (AUGMENTIN) 875-125 MG tablet Take 1 tablet by mouth 2 (two) times daily.    ? escitalopram (LEXAPRO) 20 MG tablet Take 1 tablet (20 mg total) by mouth daily. 90 tablet 1  ? hydrOXYzine (ATARAX) 10 MG tablet Take 1 tablet (10 mg total) by mouth 3 (three) times daily as needed. (Patient not taking: Reported on 07/15/2021) 30 tablet 0  ? losartan (COZAAR) 25 MG tablet Take 25 mg by mouth daily.    ? ondansetron (ZOFRAN-ODT) 4 MG disintegrating tablet Take 4 mg by mouth every 8 (eight) hours as needed.    ? oxyCODONE (OXY IR/ROXICODONE) 5 MG immediate release tablet Take 5 mg by mouth every 6 (six) hours as needed.    ? pantoprazole (PROTONIX) 40 MG tablet Take 1 tablet (40 mg total) by mouth daily. 90 tablet 1  ? rosuvastatin (CRESTOR) 10 MG tablet Take 1 tablet (10 mg total) by mouth daily. 90 tablet 1  ? ?No current facility-administered medications for this visit.  ? ? ?Allergies:   Lisinopril  ? ? ?Social History:  The patient  reports that she has been smoking cigarettes. She has a 50.00 pack-year smoking history. She  has never used smokeless tobacco. She reports current alcohol use of about 14.0 standard drinks per week. She reports current drug use. Frequency: 7.00 times per week. Drug: Marijuana.  ? ?Family History:  The patient's ***family history includes Colon cancer (age of onset: 92) in her father; Hypertension in her mother.  ? ? ?ROS:  Please see the history of present illness.   Otherwise, review of systems are positive for {NONE DEFAULTED:18576}.   All other systems are reviewed and negative.  ? ? ?PHYSICAL EXAM: ?VS:  There were no vitals taken for this visit. , BMI There is no height or weight on file to calculate BMI. ?GENERAL:  Well appearing ?HEENT:  Pupils equal round and reactive, fundi not visualized, oral mucosa unremarkable ?NECK:  No jugular venous distention, waveform within normal limits, carotid upstroke brisk and symmetric, no bruits, no thyromegaly ?LYMPHATICS:  No cervical, inguinal adenopathy ?LUNGS:  Clear to auscultation bilaterally ?BACK:  No CVA tenderness ?CHEST:  Unremarkable ?HEART:  PMI not displaced or sustained,S1 and S2 within normal limits, no S3, no S4, no clicks, no rubs, *** murmurs ?ABD:  Flat, positive bowel sounds normal in frequency in pitch, no bruits, no rebound, no guarding, no midline pulsatile mass, no hepatomegaly, no splenomegaly ?EXT:  2  plus pulses throughout, no edema, no cyanosis no clubbing ?SKIN:  No rashes no nodules ?NEURO:  Cranial nerves II through XII grossly intact, motor grossly intact throughout ?PSYCH:  Cognitively intact, oriented to person place and time ? ? ? ?EKG:  EKG {ACTION; IS/IS JQB:[id number]} ordered today. ?The ekg ordered today demonstrates *** ? ? ?Recent Labs: ?06/30/2021: ALT 12; BUN 13; Creatinine, Ser 0.89; Hemoglobin 15.0; Platelets 301; Potassium 5.1; Sodium 138; TSH 1.680  ? ? ?Lipid Panel ?   ?Component Value Date/Time  ? CHOL 283 (H) 06/30/2021 1520  ? TRIG 172 (H) 06/30/2021 1520  ? TRIG 128 09/28/2014 1655  ? HDL 63 06/30/2021 1520  ? HDL  73 09/28/2014 1655  ? CHOLHDL 4.5 (H) 06/30/2021 1520  ? LDLCALC 188 (H) 06/30/2021 1520  ? ?  ? ?Wt Readings from Last 3 Encounters:  ?07/15/21 192 lb 4 oz (87.2 kg)  ?06/30/21 194 lb (88 kg)  ?11/12/20 196 lb (88.9 kg)  ?  ? ? ?Other studies Reviewed: ?Additional studies/ records that were reviewed today include: ***. ?Review of the above records demonstrates:  Please see elsewhere in the note.  *** ? ? ?ASSESSMENT AND PLAN: ? ?Bradycardia:  *** ? ? ?Current medicines are reviewed at length with the patient today.  The patient {ACTIONS; HAS/DOES NOT HAVE:19233} concerns regarding medicines. ? ?The following changes have been made:  {PLAN; NO CHANGE:13088:s} ? ?Labs/ tests ordered today include: *** ?No orders of the defined types were placed in this encounter. ? ? ? ?Disposition:   FU with ***  ? ? ?Signed, ?Minus Breeding, MD  ?08/11/2021 10:17 PM    ?[place] ? ? ? ?

## 2021-08-13 ENCOUNTER — Ambulatory Visit: Payer: Medicare HMO | Admitting: Cardiology

## 2021-08-18 ENCOUNTER — Ambulatory Visit (INDEPENDENT_AMBULATORY_CARE_PROVIDER_SITE_OTHER): Payer: Medicare HMO | Admitting: Nurse Practitioner

## 2021-08-18 ENCOUNTER — Encounter: Payer: Self-pay | Admitting: Nurse Practitioner

## 2021-08-18 VITALS — BP 152/88 | HR 64 | Temp 97.6°F | Resp 20 | Ht 66.0 in | Wt 194.0 lb

## 2021-08-18 DIAGNOSIS — K5792 Diverticulitis of intestine, part unspecified, without perforation or abscess without bleeding: Secondary | ICD-10-CM | POA: Diagnosis not present

## 2021-08-18 DIAGNOSIS — I10 Essential (primary) hypertension: Secondary | ICD-10-CM

## 2021-08-18 DIAGNOSIS — Z7689 Persons encountering health services in other specified circumstances: Secondary | ICD-10-CM

## 2021-08-18 NOTE — Progress Notes (Signed)
? ?Subjective:  ? ? Patient ID: Theresa Reyes, female    DOB: 1956/01/14, 66 y.o.   MRN: [id number] ? ? ?Chief Complaint: Transitions Of Care ? ? ?Today's visit was for Transitional Care Management. ? ?The patient was discharged from [facility]  on 08/05/21 with a primary diagnosis of diverticulitis.  ? ?Contact with the patient and/or caregiver, by a clinical staff member, was made on 08/06/21 and was documented as a telephone encounter within the EMR. ? ?Through chart review and discussion with the patient I have determined that management of their condition is of moderate complexity.  ? ? ? ?She is doing better. Still has slight abdominal pain. She has been going from constipation and diarrhea. She has an appointment with GI on 08/26/21. WHile she was in hospital she was put on losartan for blood pressure which has made her feel terrible. We had her on norvasc '5mg'$  daily and she was dong well. Blood pressure at home has been around 117/72 with no medication. She had stopped her all blood pressure meds for a few days. She only took norvasc yesterday and it stayed around 408' systolic. She ha snot taken any  blood pressure meds today. ? ?BP Readings from Last 3 Encounters:  ?08/18/21 (!) 158/83  ?07/15/21 135/76  ?06/30/21 (!) 184/86  ? ? ? ?We started her on crestor for her cholesterol and she can only tolerate every 3 days due  to muscle aches. They gav eit to her dialy in hospital and that has fared up her muscle aches ? ? ? ? ?Review of Systems  ?Constitutional:  Negative for diaphoresis.  ?Eyes:  Negative for pain.  ?Respiratory:  Negative for shortness of breath.   ?Cardiovascular:  Negative for chest pain, palpitations and leg swelling.  ?Gastrointestinal:  Negative for abdominal pain.  ?Endocrine: Negative for polydipsia.  ?Skin:  Negative for rash.  ?Neurological:  Negative for dizziness, weakness and headaches.  ?Hematological:  Does not bruise/bleed easily.  ?All other systems reviewed and are negative. ? ?    ?Objective:  ? Physical Exam ?Constitutional:   ?   Appearance: Normal appearance. She is obese.  ?Cardiovascular:  ?   Rate and Rhythm: Normal rate and regular rhythm.  ?   Heart sounds: Normal heart sounds.  ?Pulmonary:  ?   Effort: Pulmonary effort is normal.  ?   Breath sounds: Normal breath sounds.  ?Skin: ?   General: Skin is warm.  ?Neurological:  ?   General: No focal deficit present.  ?   Mental Status: She is alert and oriented to person, place, and time.  ?Psychiatric:     ?   Mood and Affect: Mood normal.     ?   Behavior: Behavior normal.  ? ? ?BP (!) 152/88   Pulse 64   Temp 97.6 ?F (36.4 ?C) (Temporal)   Resp 20   Ht '5\' 6"'$  (1.676 m)   Wt 194 lb (88 kg)   SpO2 96%   BMI 31.31 kg/m?  ? ? ? ? ?   ?Assessment & Plan:  ? ?Theresa Reyes in today with chief complaint of Transitions Of Care ? ? ?1. Encounter for support and coordination of transition of care ?Hospital records reviewed ? ?2. Primary hypertension ?Hold losartan ?Continue norvasc '5mg'$  dialy ?Keep daily checks of blood pressure- let me know if systolic is staying above 140 on norvasc only ? ?3. Diverticulitis ?Watch diet ?Keep appointment with GI ? 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 ? ?The above assessment and management plan was discussed with the patient. The patient verbalized understanding of and has agreed to the management plan. Patient is aware to call the clinic if symptoms persist or worsen. Patient is aware when to return to the clinic for a follow-up visit. Patient educated on when it is appropriate to go to the emergency department.  ? ?Mary-Margaret Hassell Done, FNP ? ? ?

## 2021-08-18 NOTE — Patient Instructions (Signed)

## 2021-08-20 ENCOUNTER — Ambulatory Visit: Payer: Medicare HMO | Admitting: Cardiology

## 2021-08-23 HISTORY — PX: COLONOSCOPY WITH ESOPHAGOGASTRODUODENOSCOPY (EGD): SHX5779

## 2021-09-16 DIAGNOSIS — R001 Bradycardia, unspecified: Secondary | ICD-10-CM | POA: Insufficient documentation

## 2021-09-16 NOTE — Progress Notes (Deleted)
Cardiology Office Note   Date:  09/16/2021   ID:  Conny, Situ 1955/08/13, MRN [id number]  PCP:  Chevis Pretty, FNP  Cardiologist:   None Referring:  ***  No chief complaint on file.     History of Present Illness: Theresa Reyes is a 66 y.o. female who is referred by *** for evaluation of bradycardia.  ***    Past Medical History:  Diagnosis Date   ADD (attention deficit disorder)    adult   Depression    Liver mass    S/P resection and partial hepatioma 2017 at [facility]    Past Surgical History:  Procedure Laterality Date   CHOLECYSTECTOMY     ECTOPIC PREGNANCY SURGERY     OPEN PARTIAL HEPATECTOMY   2017     Current Outpatient Medications  Medication Sig Dispense Refill   acetaminophen (TYLENOL) 500 MG tablet Take by mouth.     amLODipine (NORVASC) 5 MG tablet Take 1 tablet (5 mg total) by mouth daily. 90 tablet 1   escitalopram (LEXAPRO) 20 MG tablet Take 1 tablet (20 mg total) by mouth daily. 90 tablet 1   hydrOXYzine (ATARAX) 10 MG tablet Take 1 tablet (10 mg total) by mouth 3 (three) times daily as needed. 30 tablet 0   losartan (COZAAR) 25 MG tablet Take 25 mg by mouth daily.     ondansetron (ZOFRAN-ODT) 4 MG disintegrating tablet Take 4 mg by mouth every 8 (eight) hours as needed.     pantoprazole (PROTONIX) 40 MG tablet Take 1 tablet (40 mg total) by mouth daily. 90 tablet 1   No current facility-administered medications for this visit.    Allergies:   Lisinopril    Social History:  The patient  reports that she has been smoking cigarettes. She has a 50.00 pack-year smoking history. She has never used smokeless tobacco. She reports current alcohol use of about 14.0 standard drinks per week. She reports current drug use. Frequency: 7.00 times per week. Drug: Marijuana.   Family History:  The patient's ***family history includes Colon cancer (age of onset: 36) in her father; Hypertension in her mother.    ROS:  Please see the history of  present illness.   Otherwise, review of systems are positive for {NONE DEFAULTED:18576}.   All other systems are reviewed and negative.    PHYSICAL EXAM: VS:  There were no vitals taken for this visit. , BMI There is no height or weight on file to calculate BMI. GENERAL:  Well appearing HEENT:  Pupils equal round and reactive, fundi not visualized, oral mucosa unremarkable NECK:  No jugular venous distention, waveform within normal limits, carotid upstroke brisk and symmetric, no bruits, no thyromegaly LYMPHATICS:  No cervical, inguinal adenopathy LUNGS:  Clear to auscultation bilaterally BACK:  No CVA tenderness CHEST:  Unremarkable HEART:  PMI not displaced or sustained,S1 and S2 within normal limits, no S3, no S4, no clicks, no rubs, *** murmurs ABD:  Flat, positive bowel sounds normal in frequency in pitch, no bruits, no rebound, no guarding, no midline pulsatile mass, no hepatomegaly, no splenomegaly EXT:  2 plus pulses throughout, no edema, no cyanosis no clubbing SKIN:  No rashes no nodules NEURO:  Cranial nerves II through XII grossly intact, motor grossly intact throughout PSYCH:  Cognitively intact, oriented to person place and time    EKG:  EKG {ACTION; IS/IS DTO:[id number]} ordered today. The ekg ordered today demonstrates ***   Recent Labs: 06/30/2021: ALT 12; BUN 13; Creatinine, Ser  0.89; Hemoglobin 15.0; Platelets 301; Potassium 5.1; Sodium 138; TSH 1.680    Lipid Panel    Component Value Date/Time   CHOL 283 (H) 06/30/2021 1520   TRIG 172 (H) 06/30/2021 1520   TRIG 128 09/28/2014 1655   HDL 63 06/30/2021 1520   HDL 73 09/28/2014 1655   CHOLHDL 4.5 (H) 06/30/2021 1520   LDLCALC 188 (H) 06/30/2021 1520      Wt Readings from Last 3 Encounters:  08/18/21 194 lb (88 kg)  07/15/21 192 lb 4 oz (87.2 kg)  06/30/21 194 lb (88 kg)      Other studies Reviewed: Additional studies/ records that were reviewed today include: ***. Review of the above records  demonstrates:  Please see elsewhere in the note.  ***   ASSESSMENT AND PLAN:  Bradycardia :  *** ***   Current medicines are reviewed at length with the patient today.  The patient {ACTIONS; HAS/DOES NOT HAVE:19233} concerns regarding medicines.  The following changes have been made:  {PLAN; NO CHANGE:13088:s}  Labs/ tests ordered today include: *** No orders of the defined types were placed in this encounter.    Disposition:   FU with ***    Signed, Minus Breeding, MD  09/16/2021 8:04 PM    [facility]

## 2021-09-17 ENCOUNTER — Ambulatory Visit: Payer: Medicare HMO | Admitting: Cardiology

## 2021-09-17 DIAGNOSIS — R001 Bradycardia, unspecified: Secondary | ICD-10-CM

## 2021-10-23 DIAGNOSIS — C801 Malignant (primary) neoplasm, unspecified: Secondary | ICD-10-CM

## 2021-10-23 HISTORY — DX: Malignant (primary) neoplasm, unspecified: C80.1

## 2021-11-05 ENCOUNTER — Encounter: Payer: Self-pay | Admitting: Emergency Medicine

## 2021-11-05 ENCOUNTER — Other Ambulatory Visit: Payer: Self-pay | Admitting: *Deleted

## 2021-11-05 ENCOUNTER — Other Ambulatory Visit: Payer: Self-pay

## 2021-11-05 DIAGNOSIS — K2100 Gastro-esophageal reflux disease with esophagitis, without bleeding: Secondary | ICD-10-CM

## 2021-11-05 MED ORDER — PANTOPRAZOLE SODIUM 40 MG PO TBEC
40.0000 mg | DELAYED_RELEASE_TABLET | Freq: Every day | ORAL | 1 refills | Status: DC
  Filled 2021-11-05 (×2): qty 90, 90d supply, fill #0

## 2021-11-05 MED ORDER — ONDANSETRON 4 MG PO TBDP
4.0000 mg | ORAL_TABLET | Freq: Three times a day (TID) | ORAL | 0 refills | Status: DC | PRN
  Filled 2021-11-05: qty 20, 7d supply, fill #0

## 2021-11-05 NOTE — Addendum Note (Signed)
Addended bySigurd Sos on: 11/05/2021 04:13 PM   Modules accepted: Orders

## 2021-11-05 NOTE — Telephone Encounter (Signed)
Prescription for Protonix sent to pharmacy

## 2021-11-06 ENCOUNTER — Encounter: Payer: Self-pay | Admitting: Family Medicine

## 2021-11-06 ENCOUNTER — Ambulatory Visit (INDEPENDENT_AMBULATORY_CARE_PROVIDER_SITE_OTHER): Payer: Medicare HMO | Admitting: Family Medicine

## 2021-11-06 VITALS — BP 122/74 | HR 60 | Temp 98.3°F | Ht 66.0 in | Wt 194.0 lb

## 2021-11-06 DIAGNOSIS — H6123 Impacted cerumen, bilateral: Secondary | ICD-10-CM | POA: Diagnosis not present

## 2021-11-06 DIAGNOSIS — H60501 Unspecified acute noninfective otitis externa, right ear: Secondary | ICD-10-CM | POA: Diagnosis not present

## 2021-11-06 MED ORDER — OFLOXACIN 0.3 % OT SOLN: 10.0000 [drp] | Freq: Every day | OTIC | 0 refills | Status: AC

## 2021-11-06 NOTE — Patient Instructions (Signed)
Otitis Externa  Otitis externa is an infection of the outer ear canal. The outer ear canal is the area between the outside of the ear and the eardrum. Otitis externa is sometimes called swimmer's ear. What are the causes? Common causes of this condition include: Swimming in dirty water. Moisture in the ear. An injury to the inside of the ear. An object stuck in the ear. A cut or scrape on the outside of the ear or in the ear canal. What increases the risk? You are more likely to develop this condition if you go swimming often. What are the signs or symptoms? The first symptom of this condition is often itching in the ear. Later symptoms of the condition include: Swelling of the ear. Redness in the ear. Ear pain. The pain may get worse when you pull on your ear. Pus coming from the ear. How is this diagnosed? This condition may be diagnosed by examining the ear and testing fluid from the ear for bacteria and funguses. How is this treated? This condition may be treated with: Antibiotic ear drops. These are often given for 10-14 days. Medicines to reduce itching and swelling. Follow these instructions at home: If you were prescribed antibiotic ear drops, use them as told by your health care provider. Do not stop using the antibiotic even if you start to feel better. Take over-the-counter and prescription medicines only as told by your health care provider. Avoid getting water in your ears as told by your health care provider. This may include avoiding swimming or water sports for a few days. Keep all follow-up visits. This is important. How is this prevented? Keep your ears dry. Use the corner of a towel to dry your ears after you swim or bathe. Avoid scratching or putting things in your ear. Doing these things can damage the ear canal or remove the protective wax that lines it, which makes it easier for bacteria and funguses to grow. Avoid swimming in lakes, polluted water, or swimming  pools that may not have enough chlorine. Contact a health care provider if: You have a fever. Your ear is still red, swollen, painful, or draining pus after 3 days. Your redness, swelling, or pain gets worse. You have a severe headache. Get help right away if: You have redness, swelling, and pain or tenderness in the area behind your ear. Summary Otitis externa is an infection of the outer ear canal. Common causes include swimming in dirty water, moisture in the ear, or a cut or scrape in the ear. Symptoms include pain, redness, and swelling of the ear canal. If you were prescribed antibiotic ear drops, use them as told by your health care provider. Do not stop using the antibiotic even if you start to feel better. This information is not intended to replace advice given to you by your health care provider. Make sure you discuss any questions you have with your health care provider. Document Revised: 07/24/2020 Document Reviewed: 07/24/2020 Elsevier Patient Education  2023 [employer] Inc[employer]  

## 2021-11-06 NOTE — Progress Notes (Signed)
   Acute Office Visit  Subjective:     Patient ID: Theresa Reyes, female    DOB: 06-Jan-1956, 66 y.o.   MRN: [id number]  Chief Complaint  Patient presents with   Otalgia    HPI Patient is in today for right ear pain for the last 4 days. It is tender to the touch. She has been taking baths lately and putting her head underwater. She has also been using qtips frequently. She denies cough, congestion, sore throat, drainage, or fever.   ROS As per HPI.      Objective:    BP 122/74   Pulse 60   Temp 98.3 F (36.8 C) (Temporal)   Ht '5\' 6"'$  (1.676 m)   Wt 194 lb (88 kg)   SpO2 95%   BMI 31.31 kg/m    Physical Exam Vitals and nursing note reviewed.  Constitutional:      General: She is not in acute distress.    Appearance: She is not ill-appearing, toxic-appearing or diaphoretic.  HENT:     Right Ear: Drainage (yellow), swelling (canal) and tenderness (canal, external) present. There is impacted cerumen.     Left Ear: Ear canal and external ear normal. There is impacted cerumen.  Pulmonary:     Effort: Pulmonary effort is normal. No respiratory distress.  Neurological:     Mental Status: She is alert and oriented to person, place, and time.  Psychiatric:        Mood and Affect: Mood normal.        Behavior: Behavior normal.     No results found for any visits on 11/06/21.      Assessment & Plan:   Theresa Reyes was seen today for otalgia.  Diagnoses and all orders for this visit:  Acute otitis externa of right ear, unspecified type -     ofloxacin (FLOXIN OTIC) 0.3 % OTIC solution; Place 10 drops into both ears daily for 7 days.  Bilateral impacted cerumen Discussed prevention and treatment with debrox after completing abx drops.    Return if symptoms worsen or fail to improve.  The patient indicates understanding of these issues and agrees with the plan.  Gwenlyn Perking, FNP

## 2021-11-10 ENCOUNTER — Other Ambulatory Visit: Payer: Self-pay

## 2021-11-17 ENCOUNTER — Telehealth: Payer: Self-pay | Admitting: *Deleted

## 2021-11-20 ENCOUNTER — Encounter: Payer: Self-pay | Admitting: Gynecologic Oncology

## 2021-11-21 ENCOUNTER — Other Ambulatory Visit: Payer: Self-pay

## 2021-11-23 NOTE — H&P (View-Only) (Signed)
GYNECOLOGIC ONCOLOGY NEW PATIENT CONSULTATION   Patient Name: Theresa Reyes  Patient Age: 66 y.o. Date of Service: 11/24/21 Referring Provider: Dr. Arlan Organ  Primary Care Provider: Chevis Pretty, [place] Consulting Provider: Jeral Pinch, MD   Assessment/Plan:  Postmenopausal patient with simple appearing adnexal mass.  Discussed with patient most recent CT scan with findings of a 9 centimeter simple appearing adnexal mass.  While no features on recent ultrasound suggesting complexity to the mass, which is overall reassuring for a benign process, given its size and its growth, I recommend proceeding with surgery for excision.  The patient has some lower abdominal symptoms which I suspect are related to the increasing size of this adnexal cyst.  Given its appearance on imaging, I do not think that preoperative tumor markers are necessary.  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  I discussed with her option of returning to her OB/GYN for follow-up, which would include colposcopy with possible biopsies versus performing this exam and work-up here today.  There may be a significant cost difference given that I work in a hospital-based clinic.  After discussion, the patient's preference was to do the exam today.  Colposcopy was performed with overall findings supporting possible low-grade dysplasia.  Several cervical biopsies as well as a vaginal biopsy and ECC were performed today.  I will call the patient with these results, hopefully later this week.  Tentatively discussed plan for surgery which would include robotic BSO.  I would send the adnexal mass for frozen section.  If benign, then depending on biopsies from today, no additional procedures such as a hysterectomy may be indicated.  If her cervical biopsies indicate that she needs additional surgery for cervical dysplasia (which would likely mean  either a LEEP procedure or cone), then we will have a conversation about doing both of these procedures at the same time or delaying surgery for her adnexal mass until after we have performed her cervical excision procedure.  If a borderline tumor was found at the time of surgery, then additional procedures would include possible total hysterectomy, omentectomy, and peritoneal biopsies.  If malignancy identified, then lymph node sampling would also be performed.  If hysterectomy not performed at the time of surgery, then I recommended endometrial biopsy given what appeared to be some cervical stenosis on her exam and thickened endometrial lining.  The patient has some increased surgical morbidity given her prior liver resection surgery.  We also discussed the increased morbidity associated with tobacco use around the time of surgery.  I encouraged her to decrease her tobacco use and what ever way she is able to before surgery.  A copy of this note was sent to the patient's referring provider.   65 minutes of total time was spent for this patient encounter, including preparation, face-to-face counseling with the patient and coordination of care, and documentation of the encounter.   Jeral Pinch, MD  Division of Gynecologic Oncology  Department of Obstetrics and Gynecology  [facility]  ___________________________________________  Chief Complaint: Chief Complaint  Patient presents with   Ovarian mass    History of Present Illness:  Theresa Reyes is a 66 y.o. y.o. female who is seen in consultation at the request of Dr. Tresa Res Abdul-Mbacke for an evaluation of a simple appearing adnexal cyst.  Patient has had multiple admissions in the last several years for a variety of symptoms including abdominal pain,  nausea, and emesis.  She has been treated for diverticulitis on multiple occasions, most recently in March of this year.  CT scan in April 2022 showed a 2.2  simple appearing right adnexal mass.  Repeat imaging in March of this year showed increase in size of the cystic mass now measuring 8.2 cm.  Most recently, during an emergency department visit for upper abdominal pain, CT scan showed increase size of the large right ovarian cyst which now measures 8.9 cm.  Colonic diverticulosis without findings of acute diverticulitis was also noted.  Pelvic ultrasound on 10/29/21 showed a Uterus 5.7 x 2.8 x 3.6 cm, endometrial lining 9.4 mm. 10.1 x 9.5 x 9.3 cm right adnexa with 9.1 cm simple cyst. Left ovary normal in appearance.   Patient was seen by her OB/GYN on 11/03/2021.  At that time, vaginal swabs were collected which showed no chlamydia, trichomonas, bacterial vaginosis, or gonorrhea.  Candida was noted.  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  High risk HPV by mRNA E6/A7 was not detected.  The patient has not yet been contacted about returning for any additional work-up given her abnormal Pap smear.  Patient endorses about a year of progressive discomfort in her lower abdomen, especially when her bladder gets full.  She endorses normal bowel and bladder function.  She endorses a good appetite without nausea and emesis.  She denies any vaginal bleeding or discharge.  Patient presents with her husband, [name].  PAST MEDICAL HISTORY:  Past Medical History:  Diagnosis Date   ADD (attention deficit disorder)    adult   Depression    Liver mass    S/P resection and partial hepatioma 2017 at [facility]     PAST SURGICAL HISTORY:  Past Surgical History:  Procedure Laterality Date   CHOLECYSTECTOMY     ECTOPIC PREGNANCY SURGERY     x2, both tubes removed   OPEN PARTIAL HEPATECTOMY   2017    OB/GYN HISTORY:  OB History  Gravida Para Term Preterm AB Living  '2 2       2  '$ SAB IAB Ectopic Multiple Live Births               # Outcome Date GA Lbr Len/2nd Weight Sex Delivery Anes PTL Lv  2 Para           1 Para             No LMP  recorded. Patient is postmenopausal.  Age at menarche: 28 Age at menopause: 53 Hx of HRT: Not applicable Hx of STDs: No Last pap: 10/2021 -H SIL, HPV mRNA E6/7 not detected History of abnormal pap smears: Patient thinks she had an abnormal Pap smear separate years ago.  Did not require further biopsies or procedures.  SCREENING STUDIES:  Last mammogram: 2023  Last colonoscopy: 2023  MEDICATIONS: Outpatient Encounter Medications as of 11/24/2021  Medication Sig   acetaminophen (TYLENOL) 500 MG tablet Take by mouth.   amLODipine (NORVASC) 5 MG tablet Take 1 tablet (5 mg total) by mouth daily.   escitalopram (LEXAPRO) 20 MG tablet Take 1 tablet (20 mg total) by mouth daily.   hydrOXYzine (ATARAX) 10 MG tablet Take 1 tablet (10 mg total) by mouth 3 (three) times daily as needed.   ondansetron (ZOFRAN-ODT) 4 MG disintegrating tablet Take 1 tablet (4 mg total) by mouth every 8 (eight) hours as needed.   pantoprazole (PROTONIX) 40 MG tablet Take 1 tablet (40 mg total) by mouth  daily.   [DISCONTINUED] losartan (COZAAR) 25 MG tablet Take 25 mg by mouth daily. (Patient not taking: Reported on 11/06/2021)   No facility-administered encounter medications on file as of 11/24/2021.    ALLERGIES:  Allergies  Allergen Reactions   Lisinopril     Vertigo     FAMILY HISTORY:  Family History  Problem Relation Age of Onset   Hypertension Mother    Colon cancer Father 58       Deceased age 90 from colon ca     SOCIAL HISTORY:  Social Connections: Moderately Isolated (07/15/2021)   Social Connection and Isolation Panel [NHANES]    Frequency of Communication with Friends and Family: More than three times a week    Frequency of Social Gatherings with Friends and Family: More than three times a week    Attends Religious Services: Never    Marine scientist or Organizations: No    Attends Music therapist: Never    Marital Status: Married    REVIEW OF SYSTEMS:  + Abdominal  pain Denies appetite changes, fevers, chills, fatigue, unexplained weight changes. Denies hearing loss, neck lumps or masses, mouth sores, ringing in ears or voice changes. Denies cough or wheezing.  Denies shortness of breath. Denies chest pain or palpitations. Denies leg swelling. Denies abdominal distention, blood in stools, constipation, diarrhea, nausea, vomiting, or early satiety. Denies pain with intercourse, dysuria, frequency, hematuria or incontinence. Denies hot flashes, pelvic pain, vaginal bleeding or vaginal discharge.   Denies joint pain, back pain or muscle pain/cramps. Denies itching, rash, or wounds. Denies dizziness, headaches, numbness or seizures. Denies swollen lymph nodes or glands, denies easy bruising or bleeding. Denies anxiety, depression, confusion, or decreased concentration.  Physical Exam:  Vital Signs for this encounter:  Blood pressure (!) 155/77, pulse 63, temperature 98.7 F (37.1 C), temperature source Tympanic, resp. rate 18, height '5\' 6"'$  (1.676 m), weight 190 lb (86.2 kg), SpO2 98 %. Body mass index is 30.67 kg/m. General: Alert, oriented, no acute distress.  HEENT: Normocephalic, atraumatic. Sclera anicteric.  Chest: Clear to auscultation bilaterally. No wheezes, rhonchi, or rales. Cardiovascular: Regular rate and rhythm, no murmurs, rubs, or gallops.  Abdomen: Normoactive bowel sounds. Soft, nondistended, mild tenderness with palpation in the right lower quadrant. No masses or hepatosplenomegaly appreciated. No palpable fluid wave.  Extremities: Grossly normal range of motion. Warm, well perfused. No edema bilaterally.  Skin: No rashes or lesions.  Lymphatics: No cervical, supraclavicular, or inguinal adenopathy.  GU:  Normal external female genitalia.  No lesions. No discharge or bleeding.             Bladder/urethra:  No lesions or masses, well supported bladder             Vagina: Mildly atrophic, no lesions or masses noted.              Cervix: Normal appearing, no lesions.  Atrophic.             Uterus: Small, mobile, no parametrial involvement or nodularity.             Adnexa: Approximately 10 cm mass moves in conjunction with the uterus, smooth.  Rectal: Deferred.  Colposcopy Preoperative diagnosis: H SIL Pap, high-risk HPV negative Postoperative diagnosis: Same as above Procedure: Colposcopy, cervical biopsies, vaginal biopsy, endocervical curettage Estimated blood loss: Less than 10 cc Specimens: Cervical biopsy at 11:00, cervical biopsy at 7:00, vaginal biopsy, posterior at 7:00, endocervical curettage Procedure: After the procedure was discussed  with the patient including risks and benefits, she signed informed consent.  She was then placed in dorsolithotomy position and a speculum was placed in the vagina.  5% acetic acid was then applied to the cervix and upper vagina.  Mild acetowhite changes were noted at 11:00 and from 6-8:00 on the face of the cervix, both areas with minimal punctations.  Also some mild acetowhite along the posterior vagina, just inferior to the cervix.  Transformation zone not definitively seen.  Cervix was then cleansed with Betadine x3.  Cervical biopsies were taken with Tischler forceps and placed in formalin.  Vaginal biopsy was also taken with Tischler forceps and placed in formalin.  Endocervical curettage was then performed with Kevorkian curette and Cytobrush, both of which were placed in formalin together.  Minimal bleeding was noted.  All instruments removed from the vagina.  Overall the patient tolerated the procedure well.  LABORATORY AND RADIOLOGIC DATA:  Outside medical records were reviewed to synthesize the above history, along with the history and physical obtained during the visit.   Lab Results  Component Value Date   WBC 8.3 06/30/2021   HGB 15.0 06/30/2021   HCT 45.5 06/30/2021   PLT 301 06/30/2021   GLUCOSE 88 06/30/2021   CHOL 283 (H) 06/30/2021   TRIG 172 (H) 06/30/2021    HDL 63 06/30/2021   LDLCALC 188 (H) 06/30/2021   ALT 12 06/30/2021   AST 17 06/30/2021   NA 138 06/30/2021   K 5.1 06/30/2021   CL 103 06/30/2021   CREATININE 0.89 06/30/2021   BUN 13 06/30/2021   CO2 22 06/30/2021   TSH 1.680 06/30/2021

## 2021-11-23 NOTE — Progress Notes (Signed)
GYNECOLOGIC ONCOLOGY NEW PATIENT CONSULTATION   Patient Name: Theresa Reyes  Patient Age: 66 y.o. Date of Service: 11/24/21 Referring Provider: Dr. Arlan Organ  Primary Care Provider: Chevis Pretty, [place] Consulting Provider: Jeral Pinch, MD   Assessment/Plan:  Postmenopausal patient with simple appearing adnexal mass.  Discussed with patient most recent CT scan with findings of a 9 centimeter simple appearing adnexal mass.  While no features on recent ultrasound suggesting complexity to the mass, which is overall reassuring for a benign process, given its size and its growth, I recommend proceeding with surgery for excision.  The patient has some lower abdominal symptoms which I suspect are related to the increasing size of this adnexal cyst.  Given its appearance on imaging, I do not think that preoperative tumor markers are necessary.  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  I discussed with her option of returning to her OB/GYN for follow-up, which would include colposcopy with possible biopsies versus performing this exam and work-up here today.  There may be a significant cost difference given that I work in a hospital-based clinic.  After discussion, the patient's preference was to do the exam today.  Colposcopy was performed with overall findings supporting possible low-grade dysplasia.  Several cervical biopsies as well as a vaginal biopsy and ECC were performed today.  I will call the patient with these results, hopefully later this week.  Tentatively discussed plan for surgery which would include robotic BSO.  I would send the adnexal mass for frozen section.  If benign, then depending on biopsies from today, no additional procedures such as a hysterectomy may be indicated.  If her cervical biopsies indicate that she needs additional surgery for cervical dysplasia (which would likely mean  either a LEEP procedure or cone), then we will have a conversation about doing both of these procedures at the same time or delaying surgery for her adnexal mass until after we have performed her cervical excision procedure.  If a borderline tumor was found at the time of surgery, then additional procedures would include possible total hysterectomy, omentectomy, and peritoneal biopsies.  If malignancy identified, then lymph node sampling would also be performed.  If hysterectomy not performed at the time of surgery, then I recommended endometrial biopsy given what appeared to be some cervical stenosis on her exam and thickened endometrial lining.  The patient has some increased surgical morbidity given her prior liver resection surgery.  We also discussed the increased morbidity associated with tobacco use around the time of surgery.  I encouraged her to decrease her tobacco use and what ever way she is able to before surgery.  A copy of this note was sent to the patient's referring provider.   65 minutes of total time was spent for this patient encounter, including preparation, face-to-face counseling with the patient and coordination of care, and documentation of the encounter.   Jeral Pinch, MD  Division of Gynecologic Oncology  Department of Obstetrics and Gynecology  [facility]  ___________________________________________  Chief Complaint: Chief Complaint  Patient presents with   Ovarian mass    History of Present Illness:  Theresa Reyes is a 66 y.o. y.o. female who is seen in consultation at the request of Dr. Tresa Res Abdul-Mbacke for an evaluation of a simple appearing adnexal cyst.  Patient has had multiple admissions in the last several years for a variety of symptoms including abdominal pain,  nausea, and emesis.  She has been treated for diverticulitis on multiple occasions, most recently in March of this year.  CT scan in April 2022 showed a 2.2  simple appearing right adnexal mass.  Repeat imaging in March of this year showed increase in size of the cystic mass now measuring 8.2 cm.  Most recently, during an emergency department visit for upper abdominal pain, CT scan showed increase size of the large right ovarian cyst which now measures 8.9 cm.  Colonic diverticulosis without findings of acute diverticulitis was also noted.  Pelvic ultrasound on 10/29/21 showed a Uterus 5.7 x 2.8 x 3.6 cm, endometrial lining 9.4 mm. 10.1 x 9.5 x 9.3 cm right adnexa with 9.1 cm simple cyst. Left ovary normal in appearance.   Patient was seen by her OB/GYN on 11/03/2021.  At that time, vaginal swabs were collected which showed no chlamydia, trichomonas, bacterial vaginosis, or gonorrhea.  Candida was noted.  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  High risk HPV by mRNA E6/A7 was not detected.  The patient has not yet been contacted about returning for any additional work-up given her abnormal Pap smear.  Patient endorses about a year of progressive discomfort in her lower abdomen, especially when her bladder gets full.  She endorses normal bowel and bladder function.  She endorses a good appetite without nausea and emesis.  She denies any vaginal bleeding or discharge.  Patient presents with her husband, [name].  PAST MEDICAL HISTORY:  Past Medical History:  Diagnosis Date   ADD (attention deficit disorder)    adult   Depression    Liver mass    S/P resection and partial hepatioma 2017 at [facility]     PAST SURGICAL HISTORY:  Past Surgical History:  Procedure Laterality Date   CHOLECYSTECTOMY     ECTOPIC PREGNANCY SURGERY     x2, both tubes removed   OPEN PARTIAL HEPATECTOMY   2017    OB/GYN HISTORY:  OB History  Gravida Para Term Preterm AB Living  '2 2       2  '$ SAB IAB Ectopic Multiple Live Births               # Outcome Date GA Lbr Len/2nd Weight Sex Delivery Anes PTL Lv  2 Para           1 Para             No LMP  recorded. Patient is postmenopausal.  Age at menarche: 50 Age at menopause: 64 Hx of HRT: Not applicable Hx of STDs: No Last pap: 10/2021 -H SIL, HPV mRNA E6/7 not detected History of abnormal pap smears: Patient thinks she had an abnormal Pap smear separate years ago.  Did not require further biopsies or procedures.  SCREENING STUDIES:  Last mammogram: 2023  Last colonoscopy: 2023  MEDICATIONS: Outpatient Encounter Medications as of 11/24/2021  Medication Sig   acetaminophen (TYLENOL) 500 MG tablet Take by mouth.   amLODipine (NORVASC) 5 MG tablet Take 1 tablet (5 mg total) by mouth daily.   escitalopram (LEXAPRO) 20 MG tablet Take 1 tablet (20 mg total) by mouth daily.   hydrOXYzine (ATARAX) 10 MG tablet Take 1 tablet (10 mg total) by mouth 3 (three) times daily as needed.   ondansetron (ZOFRAN-ODT) 4 MG disintegrating tablet Take 1 tablet (4 mg total) by mouth every 8 (eight) hours as needed.   pantoprazole (PROTONIX) 40 MG tablet Take 1 tablet (40 mg total) by mouth  daily.   [DISCONTINUED] losartan (COZAAR) 25 MG tablet Take 25 mg by mouth daily. (Patient not taking: Reported on 11/06/2021)   No facility-administered encounter medications on file as of 11/24/2021.    ALLERGIES:  Allergies  Allergen Reactions   Lisinopril     Vertigo     FAMILY HISTORY:  Family History  Problem Relation Age of Onset   Hypertension Mother    Colon cancer Father 82       Deceased age 64 from colon ca     SOCIAL HISTORY:  Social Connections: Moderately Isolated (07/15/2021)   Social Connection and Isolation Panel [NHANES]    Frequency of Communication with Friends and Family: More than three times a week    Frequency of Social Gatherings with Friends and Family: More than three times a week    Attends Religious Services: Never    Marine scientist or Organizations: No    Attends Music therapist: Never    Marital Status: Married    REVIEW OF SYSTEMS:  + Abdominal  pain Denies appetite changes, fevers, chills, fatigue, unexplained weight changes. Denies hearing loss, neck lumps or masses, mouth sores, ringing in ears or voice changes. Denies cough or wheezing.  Denies shortness of breath. Denies chest pain or palpitations. Denies leg swelling. Denies abdominal distention, blood in stools, constipation, diarrhea, nausea, vomiting, or early satiety. Denies pain with intercourse, dysuria, frequency, hematuria or incontinence. Denies hot flashes, pelvic pain, vaginal bleeding or vaginal discharge.   Denies joint pain, back pain or muscle pain/cramps. Denies itching, rash, or wounds. Denies dizziness, headaches, numbness or seizures. Denies swollen lymph nodes or glands, denies easy bruising or bleeding. Denies anxiety, depression, confusion, or decreased concentration.  Physical Exam:  Vital Signs for this encounter:  Blood pressure (!) 155/77, pulse 63, temperature 98.7 F (37.1 C), temperature source Tympanic, resp. rate 18, height '5\' 6"'$  (1.676 m), weight 190 lb (86.2 kg), SpO2 98 %. Body mass index is 30.67 kg/m. General: Alert, oriented, no acute distress.  HEENT: Normocephalic, atraumatic. Sclera anicteric.  Chest: Clear to auscultation bilaterally. No wheezes, rhonchi, or rales. Cardiovascular: Regular rate and rhythm, no murmurs, rubs, or gallops.  Abdomen: Normoactive bowel sounds. Soft, nondistended, mild tenderness with palpation in the right lower quadrant. No masses or hepatosplenomegaly appreciated. No palpable fluid wave.  Extremities: Grossly normal range of motion. Warm, well perfused. No edema bilaterally.  Skin: No rashes or lesions.  Lymphatics: No cervical, supraclavicular, or inguinal adenopathy.  GU:  Normal external female genitalia.  No lesions. No discharge or bleeding.             Bladder/urethra:  No lesions or masses, well supported bladder             Vagina: Mildly atrophic, no lesions or masses noted.              Cervix: Normal appearing, no lesions.  Atrophic.             Uterus: Small, mobile, no parametrial involvement or nodularity.             Adnexa: Approximately 10 cm mass moves in conjunction with the uterus, smooth.  Rectal: Deferred.  Colposcopy Preoperative diagnosis: H SIL Pap, high-risk HPV negative Postoperative diagnosis: Same as above Procedure: Colposcopy, cervical biopsies, vaginal biopsy, endocervical curettage Estimated blood loss: Less than 10 cc Specimens: Cervical biopsy at 11:00, cervical biopsy at 7:00, vaginal biopsy, posterior at 7:00, endocervical curettage Procedure: After the procedure was discussed  with the patient including risks and benefits, she signed informed consent.  She was then placed in dorsolithotomy position and a speculum was placed in the vagina.  5% acetic acid was then applied to the cervix and upper vagina.  Mild acetowhite changes were noted at 11:00 and from 6-8:00 on the face of the cervix, both areas with minimal punctations.  Also some mild acetowhite along the posterior vagina, just inferior to the cervix.  Transformation zone not definitively seen.  Cervix was then cleansed with Betadine x3.  Cervical biopsies were taken with Tischler forceps and placed in formalin.  Vaginal biopsy was also taken with Tischler forceps and placed in formalin.  Endocervical curettage was then performed with Kevorkian curette and Cytobrush, both of which were placed in formalin together.  Minimal bleeding was noted.  All instruments removed from the vagina.  Overall the patient tolerated the procedure well.  LABORATORY AND RADIOLOGIC DATA:  Outside medical records were reviewed to synthesize the above history, along with the history and physical obtained during the visit.   Lab Results  Component Value Date   WBC 8.3 06/30/2021   HGB 15.0 06/30/2021   HCT 45.5 06/30/2021   PLT 301 06/30/2021   GLUCOSE 88 06/30/2021   CHOL 283 (H) 06/30/2021   TRIG 172 (H) 06/30/2021    HDL 63 06/30/2021   LDLCALC 188 (H) 06/30/2021   ALT 12 06/30/2021   AST 17 06/30/2021   NA 138 06/30/2021   K 5.1 06/30/2021   CL 103 06/30/2021   CREATININE 0.89 06/30/2021   BUN 13 06/30/2021   CO2 22 06/30/2021   TSH 1.680 06/30/2021

## 2021-11-24 ENCOUNTER — Other Ambulatory Visit: Payer: Self-pay

## 2021-11-24 ENCOUNTER — Inpatient Hospital Stay: Payer: Medicare HMO | Attending: Gynecologic Oncology | Admitting: Gynecologic Oncology

## 2021-11-24 ENCOUNTER — Encounter: Payer: Self-pay | Admitting: Gynecologic Oncology

## 2021-11-24 VITALS — BP 155/77 | HR 63 | Temp 98.7°F | Resp 18 | Ht 66.0 in | Wt 190.0 lb

## 2021-11-24 DIAGNOSIS — F17210 Nicotine dependence, cigarettes, uncomplicated: Secondary | ICD-10-CM | POA: Insufficient documentation

## 2021-11-24 DIAGNOSIS — N83201 Unspecified ovarian cyst, right side: Secondary | ICD-10-CM | POA: Diagnosis not present

## 2021-11-24 DIAGNOSIS — K5792 Diverticulitis of intestine, part unspecified, without perforation or abscess without bleeding: Secondary | ICD-10-CM

## 2021-11-24 DIAGNOSIS — N72 Inflammatory disease of cervix uteri: Secondary | ICD-10-CM | POA: Diagnosis not present

## 2021-11-24 DIAGNOSIS — F988 Other specified behavioral and emotional disorders with onset usually occurring in childhood and adolescence: Secondary | ICD-10-CM | POA: Diagnosis not present

## 2021-11-24 DIAGNOSIS — R87613 High grade squamous intraepithelial lesion on cytologic smear of cervix (HGSIL): Secondary | ICD-10-CM | POA: Diagnosis not present

## 2021-11-24 DIAGNOSIS — Z780 Asymptomatic menopausal state: Secondary | ICD-10-CM | POA: Insufficient documentation

## 2021-11-24 DIAGNOSIS — Z79899 Other long term (current) drug therapy: Secondary | ICD-10-CM | POA: Diagnosis not present

## 2021-11-24 DIAGNOSIS — Z6830 Body mass index (BMI) 30.0-30.9, adult: Secondary | ICD-10-CM | POA: Insufficient documentation

## 2021-11-24 DIAGNOSIS — B977 Papillomavirus as the cause of diseases classified elsewhere: Secondary | ICD-10-CM | POA: Diagnosis not present

## 2021-11-24 DIAGNOSIS — R109 Unspecified abdominal pain: Secondary | ICD-10-CM | POA: Diagnosis not present

## 2021-11-24 DIAGNOSIS — D060 Carcinoma in situ of endocervix: Secondary | ICD-10-CM | POA: Diagnosis not present

## 2021-11-24 DIAGNOSIS — N83209 Unspecified ovarian cyst, unspecified side: Secondary | ICD-10-CM | POA: Insufficient documentation

## 2021-11-24 DIAGNOSIS — Z720 Tobacco use: Secondary | ICD-10-CM

## 2021-11-24 DIAGNOSIS — F32A Depression, unspecified: Secondary | ICD-10-CM | POA: Insufficient documentation

## 2021-11-24 NOTE — Patient Instructions (Signed)
Dr. Berline Lopes will contact you with the results of your cervical biopsies from today. We will tentatively select a surgery date with the knowledge the plan may change based on the biopsy results.   We will tentatively hold surgery time on December 03, 2021 with Dr. Jeral Pinch at [facility]. We will plan to bring you back for a pre-op visit with Joylene John NP on the same day as your preop appointment at the hospital.   You may also receive a phone call from the hospital to arrange for a pre-op appointment there as well.

## 2021-11-26 ENCOUNTER — Encounter (HOSPITAL_COMMUNITY): Payer: Self-pay

## 2021-11-26 ENCOUNTER — Other Ambulatory Visit: Payer: Self-pay

## 2021-11-26 ENCOUNTER — Inpatient Hospital Stay (HOSPITAL_BASED_OUTPATIENT_CLINIC_OR_DEPARTMENT_OTHER): Payer: Medicare HMO | Admitting: Gynecologic Oncology

## 2021-11-26 VITALS — BP 135/81 | HR 68 | Temp 98.1°F | Resp 18 | Ht 66.0 in | Wt 189.9 lb

## 2021-11-26 DIAGNOSIS — N83209 Unspecified ovarian cyst, unspecified side: Secondary | ICD-10-CM

## 2021-11-26 DIAGNOSIS — R87613 High grade squamous intraepithelial lesion on cytologic smear of cervix (HGSIL): Secondary | ICD-10-CM

## 2021-11-26 LAB — SURGICAL PATHOLOGY

## 2021-11-26 MED ORDER — SENNOSIDES-DOCUSATE SODIUM 8.6-50 MG PO TABS: 2.0000 | ORAL_TABLET | Freq: Every day | ORAL | 0 refills | Status: DC

## 2021-11-26 MED ORDER — TRAMADOL HCL 50 MG PO TABS: 50.0000 mg | ORAL_TABLET | Freq: Four times a day (QID) | ORAL | 0 refills | Status: DC | PRN

## 2021-11-26 NOTE — Progress Notes (Addendum)
PCP -  Ronnald Collum , MD  Marjorie Smolder, FNP [facility] 11-06-21 epic Cardiologist - no  PPM/ICD -  Device Orders -  Rep Notified -   Chest x-ray -  EKG - 07-15-21 epic Stress Test -  ECHO -  Cardiac Cath -   Sleep Study -  CPAP -   Fasting Blood Sugar -  Checks Blood Sugar _____ times a day  Blood Thinner Instructions: Aspirin Instructions:  ERAS Protcol - PRE-SURGERY    COVID vaccine -x3 moderna  Activity--Able to walk a flight of stairs without SOB Anesthesia review: HTN  Patient denies shortness of breath, fever, cough and chest pain at PAT appointment   All instructions explained to the patient, with a verbal understanding of the material. Patient agrees to go over the instructions while at home for a better understanding. Patient also instructed to self quarantine after being tested for COVID-19. The opportunity to ask questions was provided.

## 2021-11-26 NOTE — Patient Instructions (Addendum)
DUE TO COVID-19 ONLY TWO VISITORS  (aged 66 and older)  ARE ALLOWED TO COME WITH YOU AND STAY IN THE WAITING ROOM ONLY DURING PRE OP AND PROCEDURE.   **NO VISITORS ARE ALLOWED IN THE SHORT STAY AREA OR RECOVERY ROOM!!**    Your procedure is scheduled on: 12-03-21   Report to [facility] Main Entrance    Report to admitting at      1045  AM   Call this number if you have problems the morning of surgery [phone]   Eat a light diet the day before surgery.  Examples including soups, broths, toast, yogurt, mashed potatoes.  Things to avoid include carbonated beverages (fizzy beverages), raw fruits and raw vegetables, or beans.   If your bowels are filled with gas, your surgeon will have difficulty visualizing your pelvic organs which increases your surgical risks.    Do not eat food :After Midnight.   After Midnight you may have the following liquids until _0945_____ AM/ DAY OF SURGERY  then nothing by mouth  Water Black Coffee (sugar ok, NO MILK/CREAM OR CREAMERS)  Tea (sugar ok, NO MILK/CREAM OR CREAMERS) regular and decaf                             Plain Jell-O (NO RED)                                           Fruit ices (not with fruit pulp, NO RED)                                     Popsicles (NO RED)                                                                  Juice: apple, WHITE grape, WHITE cranberry Sports drinks like Gatorade (NO RED) Clear broth(vegetable,chicken,beef)                            If you have questions, please contact your surgeon's office.   FOLLOW ANY ADDITIONAL PRE OP INSTRUCTIONS YOU RECEIVED FROM YOUR SURGEON'S OFFICE!!!     Oral Hygiene is also important to reduce your risk of infection.                                    Remember - BRUSH YOUR TEETH THE MORNING OF SURGERY WITH YOUR REGULAR TOOTHPASTE   Do NOT smoke after Midnight   Take these medicines the morning of surgery with A SIP OF WATER: pantoprazole, escitalopram,  amlodipine                                You may not have any metal on your body including hair pins, jewelry, and body piercing             Do not wear  make-up, lotions, powders, perfumes/cologne, or deodorant  Do not wear nail polish including gel and S&S, artificial/acrylic nails, or any other type of covering on natural nails including finger and toenails. If you have artificial nails, gel coating, etc. that needs to be removed by a nail salon please have this removed prior to surgery or surgery may need to be canceled/ delayed if the surgeon/ anesthesia feels like they are unable to be safely monitored.   Do not shave  48 hours prior to surgery.              Do not bring valuables to the hospital. [place].   Contacts, dentures or bridgework may not be worn into surgery.   Bring small overnight bag day of surgery.   DO NOT [place]. PHARMACY WILL DISPENSE MEDICATIONS LISTED ON YOUR MEDICATION LIST TO YOU DURING YOUR ADMISSION [place]!    Patients discharged on the day of surgery will not be allowed to drive home.  Someone NEEDS to stay with you for the first 24 hours after anesthesia.   Special Instructions: Bring a copy of your healthcare power of attorney and living will documents         the day of surgery if you haven't scanned them before.              Please read over the following fact sheets you were given: IF YOU HAVE QUESTIONS ABOUT YOUR PRE-OP INSTRUCTIONS PLEASE CALL [phone]     [facility] - Preparing for Surgery Before surgery, you can play an important role.  Because skin is not sterile, your skin needs to be as free of germs as possible.  You can reduce the number of germs on your skin by washing with CHG (chlorahexidine gluconate) soap before surgery.  CHG is an antiseptic cleaner which kills germs and bonds with the skin to continue killing germs even after  washing. Please DO NOT use if you have an allergy to CHG or antibacterial soaps.  If your skin becomes reddened/irritated stop using the CHG and inform your nurse when you arrive at Short Stay. Do not shave (including legs and underarms) for at least 48 hours prior to the first CHG shower.  You may shave your face/neck. Please follow these instructions carefully:  1.  Shower with CHG Soap the night before surgery and the  morning of Surgery.  2.  If you choose to wash your hair, wash your hair first as usual with your  normal  shampoo.  3.  After you shampoo, rinse your hair and body thoroughly to remove the  shampoo.                           4.  Use CHG as you would any other liquid soap.  You can apply chg directly  to the skin and wash                       Gently with a scrungie or clean washcloth.  5.  Apply the CHG Soap to your body ONLY FROM THE NECK DOWN.   Do not use on face/ open  Wound or open sores. Avoid contact with eyes, ears mouth and genitals (private parts).                       Wash face,  Genitals (private parts) with your normal soap.             6.  Wash thoroughly, paying special attention to the area where your surgery  will be performed.  7.  Thoroughly rinse your body with warm water from the neck down.  8.  DO NOT shower/wash with your normal soap after using and rinsing off  the CHG Soap.                9.  Pat yourself dry with a clean towel.            10.  Wear clean pajamas.            11.  Place clean sheets on your bed the night of your first shower and do not  sleep with pets. Day of Surgery : Do not apply any lotions/deodorants the morning of surgery.  Please wear clean clothes to the hospital/surgery center.  FAILURE TO FOLLOW THESE INSTRUCTIONS MAY RESULT IN THE CANCELLATION OF YOUR SURGERY PATIENT SIGNATURE_________________________________  NURSE  SIGNATURE__________________________________  ________________________________________________________________________

## 2021-11-26 NOTE — Patient Instructions (Addendum)
Preparing for your Surgery  Plan for surgery on December 03, 2021 with Dr. Jeral Pinch at [place] will be scheduled for robotic assisted laparoscopic bilateral salpingo-oophorectomy (removal of the ovaries and fallopian tubes through small incisions), possible robotic assisted total hysterectomy (removal of the uterus and cervix), possible staging if a cancer is identified, possible cold knife conization of the cervix (cone shaped biopsy of the cervix), possible laparotomy (larger incision on the abdomen if needed).   The biopsies taken in the office returned as at least pre-cancerous changes within the inner canal of the cervix. At the time of the above surgery, Dr. Berline Lopes will take a cone shaped biopsy from the cervix to further evaluate the cervix. If there is an early cervical cancer present, she may recommend having a hysterectomy in the near future to treat this.   Pre-operative Testing -You will receive a phone call from presurgical testing at [facility] to arrange for a pre-operative appointment and lab work.  -Bring your insurance card, copy of an advanced directive if applicable, medication list  -At that visit, you will be asked to sign a consent for a possible blood transfusion in case a transfusion becomes necessary during surgery.  The need for a blood transfusion is rare but having consent is a necessary part of your care.     -You should not be taking blood thinners or aspirin at least ten days prior to surgery unless instructed by your surgeon.  -Do not take supplements such as fish oil (omega 3), red yeast rice, turmeric before your surgery. You want to avoid medications with aspirin in them including headache powders such as BC or Goody's), Excedrin migraine.  Day Before Surgery at [place] will be asked to take in a light diet the day before surgery. You will be advised you can have clear liquids up until 3 hours before your surgery.    Eat a  light diet the day before surgery.  Examples including soups, broths, toast, yogurt, mashed potatoes.  AVOID GAS PRODUCING FOODS. Things to avoid include carbonated beverages (fizzy beverages, sodas), raw fruits and raw vegetables (uncooked), or beans.   If your bowels are filled with gas, your surgeon will have difficulty visualizing your pelvic organs which increases your surgical risks.  Your role in recovery Your role is to become active as soon as directed by your doctor, while still giving yourself time to heal.  Rest when you feel tired. You will be asked to do the following in order to speed your recovery:  - Cough and breathe deeply. This helps to clear and expand your lungs and can prevent pneumonia after surgery.  - [place]. Do mild physical activity. Walking or moving your legs help your circulation and body functions return to normal. Do not try to get up or walk alone the first time after surgery.   -If you develop swelling on one leg or the other, pain in the back of your leg, redness/warmth in one of your legs, please call the office or go to the Emergency Room to have a doppler to rule out a blood clot. For shortness of breath, chest pain-seek care in the Emergency Room as soon as possible. - Actively manage your pain. Managing your pain lets you move in comfort. We will ask you to rate your pain on a scale of zero to 10. It is your responsibility to tell your doctor or nurse where and how much  you hurt so your pain can be treated.  Special Considerations -If you are diabetic, you may be placed on insulin after surgery to have closer control over your blood sugars to promote healing and recovery.  This does not mean that you will be discharged on insulin.  If applicable, your oral antidiabetics will be resumed when you are tolerating a solid diet.  -Your final pathology results from surgery should be available around one week after surgery and the results will be  relayed to you when available.  -FMLA forms can be faxed to [phone] and please allow 5-7 business days for completion.  Pain Management After Surgery -You have been prescribed your pain medication and bowel regimen medications before surgery so that you can have these available when you are discharged from the hospital. The pain medication is for use ONLY AFTER surgery and a new prescription will not be given.   -Make sure that you have Tylenol and Ibuprofen IF YOU ARE ABLE TO TAKE THESE MEDICATIONS at home to use on a regular basis after surgery for pain control. We recommend alternating the medications every hour to six hours since they work differently and are processed in the body differently for pain relief.  -Review the attached handout on narcotic use and their risks and side effects.   Bowel Regimen -You have been prescribed Sennakot-S to take nightly to prevent constipation especially if you are taking the narcotic pain medication intermittently.  It is important to prevent constipation and drink adequate amounts of liquids. You can stop taking this medication when you are not taking pain medication and you are back on your normal bowel routine.  Risks of Surgery Risks of surgery are low but include bleeding, infection, damage to surrounding structures, re-operation, blood clots, and very rarely death.   Blood Transfusion Information (For the consent to be signed before surgery)  We will be checking your blood type before surgery so in case of emergencies, we will know what type of blood you would need.                                            WHAT IS A BLOOD TRANSFUSION?  A transfusion is the replacement of blood or some of its parts. Blood is made up of multiple cells which provide different functions. Red blood cells carry oxygen and are used for blood loss replacement. White blood cells fight against infection. Platelets control bleeding. Plasma helps clot  blood. Other blood products are available for specialized needs, such as hemophilia or other clotting disorders. BEFORE THE TRANSFUSION  Who gives blood for transfusions?  You may be able to donate blood to be used at a later date on yourself (autologous donation). Relatives can be asked to donate blood. This is generally not any safer than if you have received blood from a stranger. The same precautions are taken to ensure safety when a relative's blood is donated. Healthy volunteers who are fully evaluated to make sure their blood is safe. This is blood bank blood. Transfusion therapy is the safest it has ever been in the practice of medicine. Before blood is taken from a donor, a complete history is taken to make sure that person has no history of diseases nor engages in risky social behavior (examples are intravenous drug use or sexual activity with multiple partners). The donor's travel history is screened  to minimize risk of transmitting infections, such as malaria. The donated blood is tested for signs of infectious diseases, such as HIV and hepatitis. The blood is then tested to be sure it is compatible with you in order to minimize the chance of a transfusion reaction. If you or a relative donates blood, this is often done in anticipation of surgery and is not appropriate for emergency situations. It takes many days to process the donated blood. RISKS AND COMPLICATIONS Although transfusion therapy is very safe and saves many lives, the main dangers of transfusion include:  Getting an infectious disease. Developing a transfusion reaction. This is an allergic reaction to something in the blood you were given. Every precaution is taken to prevent this. The decision to have a blood transfusion has been considered carefully by your caregiver before blood is given. Blood is not given unless the benefits outweigh the risks.  AFTER SURGERY INSTRUCTIONS  Return to work: 4-6 weeks if  applicable  Activity: 1. Be up and out of the bed during the day.  Take a nap if needed.  You may walk up steps but be careful and use the hand rail.  Stair climbing will tire you more than you think, you may need to stop part way and rest.   2. No lifting or straining for 6 weeks over 10 pounds. No pushing, pulling, straining for 6 weeks.  3. No driving for around 1 week(s).  Do not drive if you are taking narcotic pain medicine and make sure that your reaction time has returned.   4. You can shower as soon as the next day after surgery. Shower daily.  Use your regular soap and water (not directly on the incision) and pat your incision(s) dry afterwards; don't rub.  No tub baths or submerging your body in water until cleared by your surgeon. If you have the soap that was given to you by pre-surgical testing that was used before surgery, you do not need to use it afterwards because this can irritate your incisions.   5. No sexual activity and nothing in the vagina for 4 weeks, 8 weeks if you have a hysterectomy.  6. You may experience a small amount of clear drainage from your incisions, which is normal.  If the drainage persists, increases, or changes color please call the office.  7. Do not use creams, lotions, or ointments such as neosporin on your incisions after surgery until advised by your surgeon because they can cause removal of the dermabond glue on your incisions.    8. You may experience vaginal spotting after surgery or around the 6-8 week mark from surgery when the stitches at the top of the vagina begin to dissolve IF YOU HAVE A HYSTERECTOMY.  The spotting is normal but if you experience heavy bleeding, call our office.  9. Take Tylenol or ibuprofen first for pain if you are able to take these medications and only use narcotic pain medication for severe pain not relieved by the Tylenol or Ibuprofen.  Monitor your Tylenol intake to a max of 4,000 mg in a 24 hour period. You can  alternate these medications after surgery.  Diet: 1. Low sodium Heart Healthy Diet is recommended but you are cleared to resume your normal (before surgery) diet after your procedure.  2. It is safe to use a laxative, such as Miralax or Colace, if you have difficulty moving your bowels. You have been prescribed Sennakot-S to take at bedtime every evening after surgery  to keep bowel movements regular and to prevent constipation.    Wound Care: 1. Keep clean and dry.  Shower daily.  Reasons to call the Doctor: Fever - Oral temperature greater than 100.4 degrees Fahrenheit Foul-smelling vaginal discharge Difficulty urinating Nausea and vomiting Increased pain at the site of the incision that is unrelieved with pain medicine. Difficulty breathing with or without chest pain New calf pain especially if only on one side Sudden, continuing increased vaginal bleeding with or without clots.   Contacts: For questions or concerns you should contact:  Dr. Jeral Pinch at [phone]  Joylene John, NP at [phone]  After Hours: call [phone] and have the GYN Oncologist paged/contacted (after 5 pm or on the weekends).  Messages sent via mychart are for non-urgent matters and are not responded to after hours so for urgent needs, please call the after hours number.

## 2021-11-26 NOTE — Progress Notes (Signed)
Patient here for a pre-operative appointment prior to her scheduled surgery on December 03, 2021. She is scheduled for robotic assisted laparoscopic bilateral salpingo-oophorectomy, possible robotic assisted total hysterectomy, possible staging if a cancer is identified, possible cold knife conization of the cervix, possible laparotomy. She has her pre-admission testing appointment on 11/28/21 at [facility].  The surgery was discussed in detail.    Recent cervical biopsy results discussed with patient. 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 Discussed the options of proceeding with surgery as planned above on December 03, 2021 with the knowledge that based on the conization path results, she may be advised to undergo a hysterectomy at a separate surgery date. We also discussed proceeding with the cone biopsy of the cervix alone, evaluate the results, then proceed with surgery 6 weeks after the cone including +/- hysterectomy, BSO, possible staging. After discussion, the patient would like to proceed with the scheduled surgery above. She has been having pelvic cramping and she would like to proceed with ovarian cyst removal as listed above. She is aware she may need to have additional procedures based on the conization of the cervix results.    See after visit summary for additional details. Visual aids used to discuss items related to surgery including sequential compression stockings, foley catheter, IV pump, multi-modal pain regimen including tylenol, photo of the surgical robot, female reproductive system to discuss surgery in detail.      Discussed post-op pain management in detail including the aspects of the enhanced recovery pathway.  Advised her that a new prescription would be sent in for tramadol and it is only to be used for after her upcoming surgery.  We discussed the use of tylenol post-op and to monitor for a maximum of 4,000 mg in a 24  hour period.  Also prescribed sennakot to be used after surgery and to hold if having loose stools.  Discussed bowel regimen in detail.     Discussed the use of SCDs and measures to take at home to prevent DVT including frequent mobility.  Reportable signs and symptoms of DVT discussed. Post-operative instructions discussed and expectations for after surgery. Incisional care discussed as well including reportable signs and symptoms including erythema, drainage, wound separation.     10 minutes spent with the patient.  Verbalizing understanding of material discussed. No needs or concerns voiced at the end of the visit.   Advised patient and family to call for any needs.  Advised that her post-operative medications had been prescribed and could be picked up at any time.    This appointment is included in the global surgical bundle as pre-operative teaching and has no charge.

## 2021-11-28 ENCOUNTER — Encounter (HOSPITAL_COMMUNITY): Payer: Self-pay

## 2021-11-28 ENCOUNTER — Other Ambulatory Visit: Payer: Self-pay

## 2021-11-28 ENCOUNTER — Encounter (HOSPITAL_COMMUNITY)
Admission: RE | Admit: 2021-11-28 | Discharge: 2021-11-28 | Disposition: A | Discharge status: Home / Self Care | Payer: Medicare HMO | Source: Ambulatory Visit | Attending: Gynecologic Oncology | Admitting: Gynecologic Oncology

## 2021-11-28 DIAGNOSIS — Z01812 Encounter for preprocedural laboratory examination: Secondary | ICD-10-CM | POA: Diagnosis not present

## 2021-11-28 DIAGNOSIS — N9489 Other specified conditions associated with female genital organs and menstrual cycle: Secondary | ICD-10-CM | POA: Diagnosis not present

## 2021-11-28 HISTORY — DX: Pneumonia, unspecified organism: J18.9

## 2021-11-28 HISTORY — DX: Essential (primary) hypertension: I10

## 2021-11-28 HISTORY — DX: Gastro-esophageal reflux disease without esophagitis: K21.9

## 2021-11-28 HISTORY — DX: Anxiety disorder, unspecified: F41.9

## 2021-11-28 HISTORY — DX: Personal history of urinary calculi: Z87.442

## 2021-11-28 HISTORY — DX: Unspecified osteoarthritis, unspecified site: M19.90

## 2021-11-28 HISTORY — DX: Personal history of other diseases of the digestive system: Z87.19

## 2021-11-28 LAB — CBC
HCT: 42.8 % (ref 36.0–46.0)
Hemoglobin: 13.9 g/dL (ref 12.0–15.0)
MCH: 29.8 pg (ref 26.0–34.0)
MCHC: 32.5 g/dL (ref 30.0–36.0)
MCV: 91.6 fL (ref 80.0–100.0)
Platelets: 366 10*3/uL (ref 150–400)
RBC: 4.67 MIL/uL (ref 3.87–5.11)
RDW: 14.3 % (ref 11.5–15.5)
WBC: 9.6 10*3/uL (ref 4.0–10.5)
nRBC: 0 % (ref 0.0–0.2)

## 2021-11-28 LAB — COMPREHENSIVE METABOLIC PANEL
ALT: 14 U/L (ref 0–44)
AST: 14 U/L — ABNORMAL LOW (ref 15–41)
Albumin: 4 g/dL (ref 3.5–5.0)
Alkaline Phosphatase: 55 U/L (ref 38–126)
Anion gap: 7 (ref 5–15)
BUN: 15 mg/dL (ref 8–23)
CO2: 24 mmol/L (ref 22–32)
Calcium: 9.8 mg/dL (ref 8.9–10.3)
Chloride: 110 mmol/L (ref 98–111)
Creatinine, Ser: 0.77 mg/dL (ref 0.44–1.00)
GFR, Estimated: >60 mL/min (ref >60)
Glucose, Bld: 97 mg/dL (ref 70–99)
Potassium: 5.1 mmol/L (ref 3.5–5.1)
Sodium: 141 mmol/L (ref 135–145)
Total Bilirubin: 0.5 mg/dL (ref 0.3–1.2)
Total Protein: 7.5 g/dL (ref 6.5–8.1)

## 2021-12-02 ENCOUNTER — Telehealth: Payer: Self-pay | Admitting: Gynecologic Oncology

## 2021-12-02 ENCOUNTER — Telehealth: Payer: Self-pay

## 2021-12-02 NOTE — Telephone Encounter (Signed)
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 Discussed recommendation for CKC, ECC and possible D&C. Reviewed risks and benefits of doing robotic BSO at the same time. Given chance patient may need additional surgery for cervical pathology, I recommend we defer BSO tomorrow to hopefully avoid needing two intra-abdominal procedures within a short time period. Patient amenable.  Jeral Pinch MD Gynecologic Oncology

## 2021-12-02 NOTE — Anesthesia Preprocedure Evaluation (Signed)
Anesthesia Evaluation  Patient identified by MRN, date of birth, ID band Patient awake    Reviewed: Allergy & Precautions, NPO status , Patient's Chart, lab work & pertinent test results  Airway Mallampati: II  TM Distance: >3 FB Neck ROM: Full    Dental no notable dental hx. (+) Teeth Intact, Dental Advisory Given   Pulmonary Current Smoker and Patient abstained from smoking.,    Pulmonary exam normal breath sounds clear to auscultation       Cardiovascular hypertension, Pt. on medications Normal cardiovascular exam Rhythm:Regular Rate:Normal     Neuro/Psych Anxiety Depression negative neurological ROS     GI/Hepatic Neg liver ROS, hiatal hernia, GERD  ,  Endo/Other  negative endocrine ROS  Renal/GU Lab Results      Component                Value               Date                      CREATININE               0.77                11/28/2021                K                        5.1                 11/28/2021                     Musculoskeletal  (+) Arthritis ,   Abdominal   Peds  Hematology Lab Results      Component                Value               Date                      WBC                      9.6                 11/28/2021                HGB                      13.9                11/28/2021                HCT                      42.8                11/28/2021                MCV                      91.6                11/28/2021                PLT  366                 11/28/2021              Anesthesia Other Findings   Reproductive/Obstetrics                            Anesthesia Physical Anesthesia Plan  ASA: 3  Anesthesia Plan: General   Post-op Pain Management:    Induction: Intravenous  PONV Risk Score and Plan: 3 and Treatment may vary due to age or medical condition and Ondansetron  Airway Management Planned: LMA  Additional  Equipment: None  Intra-op Plan:   Post-operative Plan:   Informed Consent: I have reviewed the patients History and Physical, chart, labs and discussed the procedure including the risks, benefits and alternatives for the proposed anesthesia with the patient or authorized representative who has indicated his/her understanding and acceptance.     Dental advisory given  Plan Discussed with:   Anesthesia Plan Comments:        Anesthesia Quick Evaluation

## 2021-12-02 NOTE — Telephone Encounter (Signed)
Telephone call to check on pre-operative status.  Patient compliant with pre-operative instructions.  Reinforced nothing to eat after midnight. Clear liquids until 10:15. Patient to arrive at 11:00.  After speaking with Dr. Berline Lopes patient is inquiring if the cyst will be removed tomorrow. Advised patient that she will have a CKC (cold knife conization of the cervix), ECC (endocervical curettage) and possible D&C (Dilation and curettage). This will all be done vaginally and she will not have any incisions on her abdomen, the cyst will not be removed tomorrow since she may need additional surgery. No other questions or concerns voiced.  Instructed to call with any needs.

## 2021-12-03 ENCOUNTER — Encounter (HOSPITAL_COMMUNITY): Payer: Self-pay | Admitting: Gynecologic Oncology

## 2021-12-03 ENCOUNTER — Encounter (HOSPITAL_COMMUNITY)
Admission: RE | Disposition: A | Discharge status: Home / Self Care | Payer: Self-pay | Source: Ambulatory Visit | Attending: Gynecologic Oncology

## 2021-12-03 ENCOUNTER — Other Ambulatory Visit: Payer: Self-pay

## 2021-12-03 ENCOUNTER — Ambulatory Visit (HOSPITAL_COMMUNITY): Payer: Medicare HMO | Admitting: Anesthesiology

## 2021-12-03 ENCOUNTER — Telehealth: Payer: Self-pay

## 2021-12-03 ENCOUNTER — Ambulatory Visit (HOSPITAL_COMMUNITY)
Admission: RE | Admit: 2021-12-03 | Discharge: 2021-12-03 | Disposition: A | Discharge status: Home / Self Care | Payer: Medicare HMO | Source: Ambulatory Visit | Attending: Gynecologic Oncology | Admitting: Gynecologic Oncology

## 2021-12-03 ENCOUNTER — Ambulatory Visit (HOSPITAL_BASED_OUTPATIENT_CLINIC_OR_DEPARTMENT_OTHER): Payer: Medicare HMO | Admitting: Anesthesiology

## 2021-12-03 DIAGNOSIS — D060 Carcinoma in situ of endocervix: Secondary | ICD-10-CM | POA: Diagnosis not present

## 2021-12-03 DIAGNOSIS — R87613 High grade squamous intraepithelial lesion on cytologic smear of cervix (HGSIL): Secondary | ICD-10-CM | POA: Diagnosis not present

## 2021-12-03 DIAGNOSIS — N840 Polyp of corpus uteri: Secondary | ICD-10-CM | POA: Diagnosis not present

## 2021-12-03 DIAGNOSIS — I10 Essential (primary) hypertension: Secondary | ICD-10-CM

## 2021-12-03 DIAGNOSIS — D067 Carcinoma in situ of other parts of cervix: Secondary | ICD-10-CM | POA: Diagnosis not present

## 2021-12-03 DIAGNOSIS — M1990 Unspecified osteoarthritis, unspecified site: Secondary | ICD-10-CM | POA: Diagnosis not present

## 2021-12-03 DIAGNOSIS — N859 Noninflammatory disorder of uterus, unspecified: Secondary | ICD-10-CM | POA: Diagnosis present

## 2021-12-03 DIAGNOSIS — F17210 Nicotine dependence, cigarettes, uncomplicated: Secondary | ICD-10-CM

## 2021-12-03 DIAGNOSIS — F17200 Nicotine dependence, unspecified, uncomplicated: Secondary | ICD-10-CM | POA: Diagnosis not present

## 2021-12-03 DIAGNOSIS — F419 Anxiety disorder, unspecified: Secondary | ICD-10-CM | POA: Diagnosis not present

## 2021-12-03 DIAGNOSIS — F32A Depression, unspecified: Secondary | ICD-10-CM | POA: Insufficient documentation

## 2021-12-03 DIAGNOSIS — N880 Leukoplakia of cervix uteri: Secondary | ICD-10-CM | POA: Diagnosis not present

## 2021-12-03 DIAGNOSIS — N888 Other specified noninflammatory disorders of cervix uteri: Secondary | ICD-10-CM | POA: Diagnosis not present

## 2021-12-03 DIAGNOSIS — K219 Gastro-esophageal reflux disease without esophagitis: Secondary | ICD-10-CM | POA: Diagnosis not present

## 2021-12-03 DIAGNOSIS — N9489 Other specified conditions associated with female genital organs and menstrual cycle: Secondary | ICD-10-CM

## 2021-12-03 DIAGNOSIS — F418 Other specified anxiety disorders: Secondary | ICD-10-CM | POA: Diagnosis not present

## 2021-12-03 DIAGNOSIS — K449 Diaphragmatic hernia without obstruction or gangrene: Secondary | ICD-10-CM | POA: Insufficient documentation

## 2021-12-03 DIAGNOSIS — D069 Carcinoma in situ of cervix, unspecified: Secondary | ICD-10-CM

## 2021-12-03 DIAGNOSIS — Z79899 Other long term (current) drug therapy: Secondary | ICD-10-CM | POA: Diagnosis not present

## 2021-12-03 HISTORY — PX: CERVICAL CONIZATION W/BX: SHX1330

## 2021-12-03 LAB — ABO/RH: ABO/RH(D): A POS

## 2021-12-03 LAB — TYPE AND SCREEN
ABO/RH(D): A POS
Antibody Screen: NEGATIVE

## 2021-12-03 SURGERY — CONE BIOPSY, CERVIX
Anesthesia: General

## 2021-12-03 MED ORDER — LIDOCAINE HCL (PF) 1 % IJ SOLN
INTRAMUSCULAR | Status: AC
  Filled 2021-12-03: qty 30

## 2021-12-03 MED ORDER — MIDAZOLAM HCL 2 MG/2ML IJ SOLN
INTRAMUSCULAR | Status: AC
Start: 2021-12-03 — End: ?
  Filled 2021-12-03: qty 2

## 2021-12-03 MED ORDER — BUPIVACAINE HCL 0.25 % IJ SOLN
INTRAMUSCULAR | Status: DC | PRN
  Administered 2021-12-03: 10 mL

## 2021-12-03 MED ORDER — HYDRALAZINE HCL 20 MG/ML IJ SOLN
INTRAMUSCULAR | Status: AC
  Filled 2021-12-03: qty 1

## 2021-12-03 MED ORDER — ONDANSETRON HCL 4 MG/2ML IJ SOLN
INTRAMUSCULAR | Status: DC | PRN
  Administered 2021-12-03: 4 mg via INTRAVENOUS

## 2021-12-03 MED ORDER — ONDANSETRON HCL 4 MG/2ML IJ SOLN
INTRAMUSCULAR | Status: AC
  Filled 2021-12-03: qty 2

## 2021-12-03 MED ORDER — HYDROMORPHONE HCL 1 MG/ML IJ SOLN
0.2500 mg | INTRAMUSCULAR | Status: DC | PRN
  Administered 2021-12-03 (×4): 0.5 mg via INTRAVENOUS

## 2021-12-03 MED ORDER — MIDAZOLAM HCL 5 MG/5ML IJ SOLN
INTRAMUSCULAR | Status: DC | PRN
  Administered 2021-12-03: 2 mg via INTRAVENOUS

## 2021-12-03 MED ORDER — CHLORHEXIDINE GLUCONATE 0.12 % MT SOLN
15.0000 mL | Freq: Once | OROMUCOSAL | Status: AC
  Administered 2021-12-03: 15 mL via OROMUCOSAL

## 2021-12-03 MED ORDER — OXYCODONE HCL 5 MG PO TABS: 5.0000 mg | ORAL_TABLET | Freq: Once | ORAL | Status: DC | PRN

## 2021-12-03 MED ORDER — SILVER NITRATE-POT NITRATE 75-25 % EX MISC
CUTANEOUS | Status: AC
  Filled 2021-12-03: qty 10

## 2021-12-03 MED ORDER — ONDANSETRON HCL 4 MG/2ML IJ SOLN
4.0000 mg | Freq: Once | INTRAMUSCULAR | Status: AC | PRN
Start: 2021-12-03 — End: 2021-12-03
  Administered 2021-12-03: 4 mg via INTRAVENOUS

## 2021-12-03 MED ORDER — DEXAMETHASONE SODIUM PHOSPHATE 10 MG/ML IJ SOLN
INTRAMUSCULAR | Status: DC | PRN
  Administered 2021-12-03: 10 mg via INTRAVENOUS

## 2021-12-03 MED ORDER — HYDRALAZINE HCL 20 MG/ML IJ SOLN
5.0000 mg | Freq: Once | INTRAMUSCULAR | Status: AC
  Administered 2021-12-03: 5 mg via INTRAVENOUS

## 2021-12-03 MED ORDER — PROPOFOL 10 MG/ML IV BOLUS
INTRAVENOUS | Status: DC | PRN
  Administered 2021-12-03: 200 mg via INTRAVENOUS

## 2021-12-03 MED ORDER — FENTANYL CITRATE (PF) 100 MCG/2ML IJ SOLN
INTRAMUSCULAR | Status: AC
  Filled 2021-12-03: qty 2

## 2021-12-03 MED ORDER — OXYCODONE HCL 5 MG/5ML PO SOLN: 5.0000 mg | Freq: Once | ORAL | Status: DC | PRN

## 2021-12-03 MED ORDER — ACETAMINOPHEN 500 MG PO TABS
1000.0000 mg | ORAL_TABLET | ORAL | Status: AC
  Administered 2021-12-03: 1000 mg via ORAL
  Filled 2021-12-03: qty 2

## 2021-12-03 MED ORDER — HYDROMORPHONE HCL 1 MG/ML IJ SOLN
INTRAMUSCULAR | Status: AC
  Filled 2021-12-03: qty 1

## 2021-12-03 MED ORDER — ACETIC ACID 5 % SOLN
Status: AC
  Filled 2021-12-03: qty 50

## 2021-12-03 MED ORDER — PROPOFOL 10 MG/ML IV BOLUS
INTRAVENOUS | Status: AC
  Filled 2021-12-03: qty 20

## 2021-12-03 MED ORDER — FERRIC SUBSULFATE 259 MG/GM EX SOLN
CUTANEOUS | Status: AC
  Filled 2021-12-03: qty 8

## 2021-12-03 MED ORDER — DEXAMETHASONE SODIUM PHOSPHATE 4 MG/ML IJ SOLN: 4.0000 mg | INTRAMUSCULAR | Status: DC

## 2021-12-03 MED ORDER — ORAL CARE MOUTH RINSE: 15.0000 mL | Freq: Once | OROMUCOSAL | Status: AC

## 2021-12-03 MED ORDER — LACTATED RINGERS IV SOLN: INTRAVENOUS | Status: DC

## 2021-12-03 MED ORDER — MIDAZOLAM HCL 2 MG/2ML IJ SOLN
INTRAMUSCULAR | Status: AC
  Administered 2021-12-03: 1 mg via INTRAVENOUS
  Filled 2021-12-03: qty 2

## 2021-12-03 MED ORDER — SODIUM CHLORIDE 0.9 % IR SOLN
Status: DC | PRN
  Administered 2021-12-03: 1000 mL

## 2021-12-03 MED ORDER — KETOROLAC TROMETHAMINE 30 MG/ML IJ SOLN
INTRAMUSCULAR | Status: AC
  Filled 2021-12-03: qty 1

## 2021-12-03 MED ORDER — BUPIVACAINE HCL 0.25 % IJ SOLN
INTRAMUSCULAR | Status: AC
  Filled 2021-12-03: qty 1

## 2021-12-03 MED ORDER — FENTANYL CITRATE (PF) 100 MCG/2ML IJ SOLN
INTRAMUSCULAR | Status: DC | PRN
  Administered 2021-12-03 (×3): 50 ug via INTRAVENOUS

## 2021-12-03 MED ORDER — KETOROLAC TROMETHAMINE 30 MG/ML IJ SOLN
30.0000 mg | Freq: Once | INTRAMUSCULAR | Status: AC | PRN
Start: 2021-12-03 — End: 2021-12-03
  Administered 2021-12-03: 30 mg via INTRAVENOUS

## 2021-12-03 MED ORDER — MIDAZOLAM HCL 2 MG/2ML IJ SOLN: 1.0000 mg | INTRAMUSCULAR | Status: DC

## 2021-12-03 MED ORDER — LIDOCAINE 2% (20 MG/ML) 5 ML SYRINGE
INTRAMUSCULAR | Status: DC | PRN
  Administered 2021-12-03: 80 mg via INTRAVENOUS

## 2021-12-03 SURGICAL SUPPLY — 42 items
BAG COUNTER SPONGE SURGICOUNT (BAG) ×4 IMPLANT
BAG SPNG CNTER NS LX DISP (BAG) ×2
BIPOLAR CUTTING LOOP 21FR (ELECTRODE)
BLADE SURG SZ11 CARB STEEL (BLADE) ×3 IMPLANT
CATH ROBINSON RED A/P 16FR (CATHETERS) ×2 IMPLANT
DEVICE MYOSURE LITE (MISCELLANEOUS) IMPLANT
DEVICE MYOSURE REACH (MISCELLANEOUS) IMPLANT
DILATOR CANAL MILEX (MISCELLANEOUS) ×3 IMPLANT
ELECT LLETZ BALL 5MM DISP (ELECTRODE) IMPLANT
GAUZE 4X4 16PLY ~~LOC~~+RFID DBL (SPONGE) ×2 IMPLANT
GLOVE BIO SURGEON STRL SZ 6 (GLOVE) ×8 IMPLANT
GLOVE BIO SURGEON STRL SZ 6.5 (GLOVE) IMPLANT
GOWN STRL REUS W/ TWL LRG LVL3 (GOWN DISPOSABLE) ×3 IMPLANT
GOWN STRL REUS W/TWL LRG LVL3 (GOWN DISPOSABLE) ×6
HEMOSTAT SURGICEL 2X14 (HEMOSTASIS) ×2 IMPLANT
IV NS IRRIG 3000ML ARTHROMATIC (IV SOLUTION) ×3 IMPLANT
KIT PROCEDURE FLUENT (KITS) IMPLANT
KIT TURNOVER KIT A (KITS) IMPLANT
LOOP CUTTING BIPOLAR 21FR (ELECTRODE) IMPLANT
MYOSURE XL FIBROID (MISCELLANEOUS)
NDL SPNL 22GX3.5 QUINCKE BK (NEEDLE) ×1 IMPLANT
NEEDLE SPNL 22GX3.5 QUINCKE BK (NEEDLE) ×3 IMPLANT
PACK VAGINAL WOMENS (CUSTOM PROCEDURE TRAY) ×3 IMPLANT
PAD OB MATERNITY 4.3X12.25 (PERSONAL CARE ITEMS) ×1 IMPLANT
PAD PREP 24X48 CUFFED NSTRL (MISCELLANEOUS) ×1 IMPLANT
PIPET BIOPSY ENDOMETRIAL 3MM (SUCTIONS) ×2 IMPLANT
SCOPETTES 8  STERILE (MISCELLANEOUS)
SCOPETTES 8 STERILE (MISCELLANEOUS) IMPLANT
SEAL ROD LENS SCOPE MYOSURE (ABLATOR) IMPLANT
SOL PREP POV-IOD 4OZ 10% (MISCELLANEOUS) ×3 IMPLANT
SPONGE SURGIFOAM ABS GEL 12-7 (HEMOSTASIS) IMPLANT
SUT VIC AB 0 CT1 27 (SUTURE) ×6
SUT VIC AB 0 CT1 27XBRD ANTBC (SUTURE) ×4 IMPLANT
SUT VIC AB 2-0 SH 27 (SUTURE) ×3
SUT VIC AB 2-0 SH 27X BRD (SUTURE) ×2 IMPLANT
SUT VIC AB 2-0 UR5 27 (SUTURE) IMPLANT
SUT VICRYL 0 UR6 27IN ABS (SUTURE) IMPLANT
SYR BULB IRRIG 60ML STRL (SYRINGE) ×2 IMPLANT
SYSTEM TISS REMOVAL MYOSURE XL (MISCELLANEOUS) IMPLANT
TOWEL OR 17X26 10 PK STRL BLUE (TOWEL DISPOSABLE) ×3 IMPLANT
TOWEL OR NON WOVEN STRL DISP B (DISPOSABLE) ×1 IMPLANT
WATER STERILE IRR 500ML POUR (IV SOLUTION) ×1 IMPLANT

## 2021-12-03 NOTE — Interval H&P Note (Signed)
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  The various methods of treatment have been discussed with the patient and family. After consideration of risks, benefits and other options for treatment, the patient has consented to  Procedure(s): DILATATION & CURETTAGE/HYSTEROSCOPY WITH MYOSURE/COLD KNIFE CONE (Bilateral) CONIZATION CERVIX WITH BIOPSY (N/A) as a surgical intervention.  The patient's history has been reviewed, patient examined, no change in status, stable for surgery.  I have reviewed the patient's chart and labs.  Questions were answered to the patient's satisfaction.     Lafonda Mosses

## 2021-12-03 NOTE — Telephone Encounter (Signed)
Please see if you can reach patient about this

## 2021-12-03 NOTE — Transfer of Care (Signed)
Immediate Anesthesia Transfer of Care Note  Patient: Theresa Reyes  Procedure(s) Performed: CONIZATION CERVIX WITH BIOPSY; ENDOCERVICAL BIOPSY  Patient Location: PACU  Anesthesia Type:General  Level of Consciousness: sedated, patient cooperative and responds to stimulation  Airway & Oxygen Therapy: Patient Spontanous Breathing and Patient connected to face mask oxygen  Post-op Assessment: Report given to RN and Post -op Vital signs reviewed and stable  Post vital signs: Reviewed and stable  Last Vitals:  Vitals Value Taken Time  BP 163/88 12/03/21 1634  Temp    Pulse    Resp 16 12/03/21 1636  SpO2    Vitals shown include unvalidated device data.  Last Pain:  Vitals:   12/03/21 1215  PainSc: 0-No pain         Complications: No notable events documented.

## 2021-12-03 NOTE — Discharge Instructions (Addendum)
AFTER SURGERY INSTRUCTIONS  Return to work: 2-3 weeks if applicable  You may notice a piece of gauze like-material come out of the vagina over the next several days. This is NORMAL and is used during surgery to help stop bleeding at the cervix.   Activity: 1. Be up and out of the bed during the day.  Take a nap if needed.  You may walk up steps but be careful and use the hand rail.  Stair climbing will tire you more than you think, you may need to stop part way and rest.   2. No lifting or straining for 4 weeks over 10 pounds. No pushing, pulling, straining for 4 weeks.  3. No driving for at least 24 hours after receiving anesthesia.  Do not drive if you are taking narcotic pain medicine and make sure that your reaction time has returned.   4. You can shower as soon as the next day after surgery. Shower daily.  Use your regular soap and water (not directly on the incision) and pat your incision(s) dry afterwards; don't rub.  No tub baths or submerging your body in water until cleared by your surgeon. If you have the soap that was given to you by pre-surgical testing that was used before surgery, you do not need to use it afterwards because this can irritate your incisions.   5. No sexual activity and nothing in the vagina for 4 weeks minimum.  6. You may experience vaginal spotting after surgery.  The spotting is normal but if you experience heavy bleeding, call our office.  7. Take Tylenol or ibuprofen first for pain if you are able to take these medications and only use narcotic pain medication for severe pain not relieved by the Tylenol or Ibuprofen.  Monitor your Tylenol intake to a max of 4,000 mg in a 24 hour period. You can alternate these medications after surgery.  Diet: 1. Low sodium Heart Healthy Diet is recommended but you are cleared to resume your normal (before surgery) diet after your procedure.  2. It is safe to use a laxative, such as Miralax or Colace, if you have  difficulty moving your bowels. You have been prescribed Sennakot-S to take at bedtime every evening after surgery to keep bowel movements regular and to prevent constipation.    Wound Care: 1. Keep clean and dry.  Shower daily.  Reasons to call the Doctor: Fever - Oral temperature greater than 100.4 degrees Fahrenheit Foul-smelling vaginal discharge Difficulty urinating Nausea and vomiting Increased pain at the site of the incision that is unrelieved with pain medicine. Difficulty breathing with or without chest pain New calf pain especially if only on one side Sudden, continuing increased vaginal bleeding with or without clots.   Contacts: For questions or concerns you should contact:  Dr. Jeral Pinch at [phone]  Joylene John, NP at [phone]  After Hours: call [phone] and have the GYN Oncologist paged/contacted (after 5 pm or on the weekends).  Messages sent via mychart are for non-urgent matters and are not responded to after hours so for urgent needs, please call the after hours number.

## 2021-12-03 NOTE — Anesthesia Procedure Notes (Signed)
Procedure Name: LMA Insertion Date/Time: 12/03/2021 4:00 PM  Performed by: Gean Maidens, CRNAPre-anesthesia Checklist: Patient identified, Emergency Drugs available, Suction available, Patient being monitored and Timeout performed Patient Re-evaluated:Patient Re-evaluated prior to induction Oxygen Delivery Method: Circle system utilized Preoxygenation: Pre-oxygenation with 100% oxygen Induction Type: IV induction Ventilation: Mask ventilation without difficulty LMA: LMA inserted LMA Size: 4.0 Number of attempts: 1 Placement Confirmation: positive ETCO2 and breath sounds checked- equal and bilateral Tube secured with: Tape Dental Injury: Teeth and Oropharynx as per pre-operative assessment

## 2021-12-03 NOTE — Telephone Encounter (Signed)
Patient has screening mammogram done on 07/16/21: findings were that in the left breast, 3 asymmetries warrant further evaluation - diagnostic mammo and ultrasound recommended. As of today patient has not had done. Mult attempts have been made by the imaging center and myself to contact patient (phone, mychart, mail)  in regards to making an appointment. MyChart message was sent to the patient 12/01/21 and says that it has been read.

## 2021-12-03 NOTE — Op Note (Signed)
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 Cervix with endocervical polypoid tissue (biopsied as endocervical biopsy). Scant tissue noted on EMB. On CKC, cervical tissue very firm and difficult to incise.   Procedure: The patient was identified in the preoperative holding area. Informed consent was signed on the chart. Patient was seen history was reviewed and exam was performed.   The patient was then taken to the operating room and placed in the supine position with SCD hose on. General anesthesia was then induced without difficulty. She was then placed in the dorsolithotomy position. The perineum was prepped with Betadine. The vagina was prepped with Betadine. The patient was then draped after the prep was dried. An in and out catheterization to empty the bladder was performed under sterile conditions.  Timeout was performed the patient, procedure, antibiotic, allergy, and length of procedure.   The weighted speculum was placed in the posterior vagina. The right angle retractor was placed anteriorally to visualize the cervix. A 0-vicryl suture on a UR-6 needle was used to place stay sutures (which were tagged) at 3 and 9 o'clock of the cervicovaginal  junction. 10cc of 1% lidocaine was infiltrated into 4 and 8 o'clock of the cervicovaginal junction. The uterine sound was placed in the cervix to delineate the course of the endocervical canal.   The endometrial biopsy was performed and placed in formalin. Tischler forceps were used to take multiple biopsies from endocervical polypoid tissue. Two cervical biopsies were also taken.  An 11 blade scalpel was used to make an incision around the face of the cervix, inside of the stay sutures, circumferentially. Given small cervix, this was a narrow cone. An Donne Hazel was used to grasp the specimen to manipulate it. The incision was then angled towards the endocervix to amputate the specimen. It was removed, oriented with a marking stitch at the 12 o'clock ectocervix and sent to pathology.   A post-cone ECC was collected from the endocervical canal using a kavorkian currette.  The bovie was used at 78 coag to create hemostasis at the surgical bed. Surgical was placed within the cone bed and stay sutures were tied loosely over to hold surgicel in place.  The vagina was irrigated.  All instrument, suture, laparotomy, Ray-Tec, and needle counts were correct x2. The patient tolerated the procedure well and was taken recovery room in stable condition.  Lafonda Mosses, MD

## 2021-12-04 ENCOUNTER — Telehealth: Payer: Self-pay

## 2021-12-04 ENCOUNTER — Encounter (HOSPITAL_COMMUNITY): Payer: Self-pay | Admitting: Gynecologic Oncology

## 2021-12-04 NOTE — Anesthesia Postprocedure Evaluation (Signed)
Anesthesia Post Note  Patient: Theresa Reyes  Procedure(s) Performed: CONIZATION CERVIX WITH BIOPSY; ENDOCERVICAL BIOPSY     Patient location during evaluation: PACU Anesthesia Type: General Level of consciousness: awake and alert Pain management: pain level controlled Vital Signs Assessment: post-procedure vital signs reviewed and stable Respiratory status: spontaneous breathing, nonlabored ventilation, respiratory function stable and patient connected to nasal cannula oxygen Cardiovascular status: blood pressure returned to baseline and stable Postop Assessment: no apparent nausea or vomiting Anesthetic complications: no   No notable events documented.  Last Vitals:  Vitals:   12/03/21 1813 12/03/21 1826  BP: (!) 177/68 (!) 163/84  Pulse:  75  Resp:  18  Temp:  (!) 36.4 C  SpO2:  98%    Last Pain:  Vitals:   12/03/21 1826  PainSc: 5                  Barnet Glasgow

## 2021-12-04 NOTE — Telephone Encounter (Signed)
Attempted to reach patient to check in with her post-operatively. Unable to reach patient. Left message requesting return call.

## 2021-12-05 ENCOUNTER — Telehealth: Payer: Self-pay | Admitting: Gynecologic Oncology

## 2021-12-05 LAB — SURGICAL PATHOLOGY

## 2021-12-05 NOTE — Telephone Encounter (Signed)
Called patient.  Discussed results from surgery.  This showed high-grade dysplasia of the cervix.  Cone biopsy with negative margins.  Endometrial biopsy without hyperplasia or malignancy.  We will plan for surgery in approximately 6 weeks for robotic BSO.  Discussed risks and benefits of concurrent hysterectomy.  From a dysplasia standpoint.  She has had adequate treatment with her cone biopsy given negative margins.  She will require additional follow-up in the setting of high-grade dysplasia.  Jeral Pinch MD Gynecologic Oncology

## 2021-12-05 NOTE — Telephone Encounter (Signed)
Spoke with Theresa Reyes this morning. She states she is eating, drinking and urinating well. She endorses some urinary urgency yesterday but this is improving today. Instructed to monitor and notify the office if this persists, worsens or she develops any burning, pain, odor, fever or chills. She has had a BM and is taking senokot as prescribed. Encouraged her to drink plenty of water. She denies fever or chills.  She rates her pain 2/10. Her pain is controlled with Tylenol and Ibuprofen as needed.     Instructed to call office with any fever, chills, purulent drainage, uncontrolled pain or any other questions or concerns. Patient verbalizes understanding.   Pt aware of post op appointments as well as the office number [phone] and after hours number [phone] to call if she has any questions or concerns

## 2021-12-09 ENCOUNTER — Encounter: Payer: Self-pay | Admitting: Gynecologic Oncology

## 2021-12-09 ENCOUNTER — Telehealth: Payer: Self-pay

## 2021-12-09 DIAGNOSIS — N9489 Other specified conditions associated with female genital organs and menstrual cycle: Secondary | ICD-10-CM | POA: Insufficient documentation

## 2021-12-09 NOTE — Telephone Encounter (Signed)
Spoke with patient and offered her surgical dates of 01/14/22, 01/20/22, or 01/21/22. Patient would like to proceed with surgery on 01/14/22. Advised that the hospital may reach out to her to arrange a pre-admissions appointment. We will see her in the office on 01/01/22 for a post-op appointment as well as pre-op with Joylene John, NP. Patient verbalized understanding.  Patient reports she had heavier bleeding on Saturday and Sunday as well as increased pain and cramping. She endorses needing to take a tramadol for the pain. She reports the bleeding has slowed today and the discomfort is gone. Patient unsure if she did any heavy lifting or increased activity on Saturday. Advised to monitor bleeding and contact our office for increased pain or bleeding. Patient verbalized understanding.

## 2021-12-09 NOTE — Telephone Encounter (Signed)
Attempted to contact patient regarding surgery scheduling. Unable to contact patient, left message requesting return call.  Patient has the following options for her upcoming surgery: 01/14/22, 01/20/22, 01/21/22

## 2021-12-10 DIAGNOSIS — I10 Essential (primary) hypertension: Secondary | ICD-10-CM

## 2021-12-11 ENCOUNTER — Telehealth: Payer: Self-pay

## 2021-12-11 MED ORDER — AMLODIPINE BESYLATE 5 MG PO TABS: 5.0000 mg | ORAL_TABLET | Freq: Every day | ORAL | 1 refills | Status: DC

## 2021-12-11 NOTE — Telephone Encounter (Signed)
Following up with patient, advised per NP that the decision on the hysterectomy is up to her. If she elects for this we will add this to her procedure. Patient verbalized understanding and would like proceed with the hysterectomy.   Provided patient with recommendations for gynecologists: Edwinna Areola or Bobbye Charleston. Patient requests this information be sent via mychart. Mychart has been sent. Instructed to call with any needs.

## 2021-12-11 NOTE — Telephone Encounter (Signed)
Spoke with patient to cancel phone visit with Dr. Berline Lopes on 12/15/21 as pathology from surgery has already been reviewed with patient. Patient is in agreement of cancelling appointment.  Patient inquiring:  1.) If she will be having a hysterectomy on 01/14/22 as it is only listed as possible. Patient preference is for a hysterectomy.  2.) If our office has a recommendation for a gynecologist in the [place] area for future follow up. She would like to transition from her current gynecologist.   Advised patient that RN will seek clarification for her upcoming surgery and obtain gyn recommendations and follow up with her. Patient verbalized understanding.

## 2021-12-15 ENCOUNTER — Inpatient Hospital Stay: Payer: Medicare HMO | Admitting: Gynecologic Oncology

## 2021-12-19 DIAGNOSIS — K5730 Diverticulosis of large intestine without perforation or abscess without bleeding: Secondary | ICD-10-CM | POA: Diagnosis not present

## 2021-12-19 DIAGNOSIS — N83201 Unspecified ovarian cyst, right side: Secondary | ICD-10-CM | POA: Diagnosis not present

## 2021-12-19 DIAGNOSIS — R1013 Epigastric pain: Secondary | ICD-10-CM | POA: Diagnosis not present

## 2021-12-19 DIAGNOSIS — K3189 Other diseases of stomach and duodenum: Secondary | ICD-10-CM | POA: Diagnosis not present

## 2021-12-19 DIAGNOSIS — R0789 Other chest pain: Secondary | ICD-10-CM | POA: Diagnosis not present

## 2021-12-19 DIAGNOSIS — K2900 Acute gastritis without bleeding: Secondary | ICD-10-CM | POA: Diagnosis not present

## 2021-12-19 DIAGNOSIS — Z79899 Other long term (current) drug therapy: Secondary | ICD-10-CM | POA: Diagnosis not present

## 2021-12-19 DIAGNOSIS — R112 Nausea with vomiting, unspecified: Secondary | ICD-10-CM | POA: Diagnosis not present

## 2021-12-19 DIAGNOSIS — I10 Essential (primary) hypertension: Secondary | ICD-10-CM | POA: Diagnosis not present

## 2021-12-19 DIAGNOSIS — F17210 Nicotine dependence, cigarettes, uncomplicated: Secondary | ICD-10-CM | POA: Diagnosis not present

## 2021-12-24 ENCOUNTER — Telehealth: Payer: Self-pay | Admitting: Gynecologic Oncology

## 2021-12-24 NOTE — Telephone Encounter (Signed)
Received additional records from [facility]  On 11/03/2021: Vaginal swabs negative for vaginosis, trichomonas, chlamydia, and gonorrhea.  Candida species identified. Postmenopausal Roma, 1.53, normal CA-125, 14 HE4, 71, normal  Creatinine 0.81 LFTs normal WBC 11.6, hemoglobin 14, hematocrit 41.4, platelets 363 Hemoglobin A1c 5.1%  Pap test: H SIL, high risk HPV not detected

## 2021-12-24 NOTE — Patient Instructions (Signed)
Your recent breast imaging examination performed on 07/16/21 shows the need for diagnostic imaging.  If you do not already have an appointment for follow-up, please call [facility] at [phone]. When you return for your evaluation, you will meet with a Radiologist to review your results.

## 2021-12-29 ENCOUNTER — Ambulatory Visit (INDEPENDENT_AMBULATORY_CARE_PROVIDER_SITE_OTHER): Payer: Medicare HMO | Admitting: Nurse Practitioner

## 2021-12-29 ENCOUNTER — Encounter: Payer: Self-pay | Admitting: Nurse Practitioner

## 2021-12-29 VITALS — BP 141/83 | HR 63 | Temp 96.9°F | Resp 20 | Ht 66.0 in | Wt 187.0 lb

## 2021-12-29 DIAGNOSIS — E876 Hypokalemia: Secondary | ICD-10-CM

## 2021-12-29 DIAGNOSIS — F1210 Cannabis abuse, uncomplicated: Secondary | ICD-10-CM | POA: Diagnosis not present

## 2021-12-29 DIAGNOSIS — I10 Essential (primary) hypertension: Secondary | ICD-10-CM

## 2021-12-29 DIAGNOSIS — D72829 Elevated white blood cell count, unspecified: Secondary | ICD-10-CM | POA: Diagnosis not present

## 2021-12-29 DIAGNOSIS — K5792 Diverticulitis of intestine, part unspecified, without perforation or abscess without bleeding: Secondary | ICD-10-CM | POA: Diagnosis not present

## 2021-12-29 DIAGNOSIS — F3341 Major depressive disorder, recurrent, in partial remission: Secondary | ICD-10-CM

## 2021-12-29 DIAGNOSIS — K2100 Gastro-esophageal reflux disease with esophagitis, without bleeding: Secondary | ICD-10-CM | POA: Diagnosis not present

## 2021-12-29 DIAGNOSIS — F419 Anxiety disorder, unspecified: Secondary | ICD-10-CM | POA: Diagnosis not present

## 2021-12-29 DIAGNOSIS — K5732 Diverticulitis of large intestine without perforation or abscess without bleeding: Secondary | ICD-10-CM

## 2021-12-29 DIAGNOSIS — Z720 Tobacco use: Secondary | ICD-10-CM

## 2021-12-29 DIAGNOSIS — C55 Malignant neoplasm of uterus, part unspecified: Secondary | ICD-10-CM | POA: Diagnosis not present

## 2021-12-29 DIAGNOSIS — Z6828 Body mass index (BMI) 28.0-28.9, adult: Secondary | ICD-10-CM

## 2021-12-29 MED ORDER — AMLODIPINE BESYLATE 5 MG PO TABS: 5.0000 mg | ORAL_TABLET | Freq: Every day | ORAL | 1 refills | Status: DC

## 2021-12-29 MED ORDER — ESCITALOPRAM OXALATE 20 MG PO TABS: 20.0000 mg | ORAL_TABLET | Freq: Every day | ORAL | 1 refills | Status: DC

## 2021-12-29 MED ORDER — ATORVASTATIN CALCIUM 10 MG PO TABS: 10.0000 mg | ORAL_TABLET | Freq: Every day | ORAL | 1 refills | Status: DC

## 2021-12-29 MED ORDER — HYDROXYZINE HCL 10 MG PO TABS: 10.0000 mg | ORAL_TABLET | Freq: Three times a day (TID) | ORAL | 3 refills | Status: DC | PRN

## 2021-12-29 MED ORDER — PANTOPRAZOLE SODIUM 40 MG PO TBEC: 40.0000 mg | DELAYED_RELEASE_TABLET | Freq: Every day | ORAL | 1 refills | Status: DC

## 2021-12-29 MED ORDER — BUPROPION HCL ER (XL) 150 MG PO TB24: 150.0000 mg | ORAL_TABLET | Freq: Every day | ORAL | 1 refills | Status: DC

## 2021-12-29 NOTE — Progress Notes (Addendum)
Subjective:    Patient ID: Theresa Reyes, female    DOB: 27-Oct-1955, 66 y.o.   MRN: [id number]   Chief Complaint: medical management of chronic issues     HPI:  Theresa Reyes is a 66 y.o. who identifies as a female who was assigned female at birth.   Social history: Lives with: husband and son Work history: work in [place] in today for follow up of the following chronic medical issues:  1. Essential hypertension No c/o chest [pain, sob or headache. Doe snot check blod pressure at home. BP Readings from Last 3 Encounters:  12/03/21 (!) 163/84  11/28/21 (!) 141/82  11/26/21 135/81     2. Hypokalemia No c/o lower ext cramping Lab Results  Component Value Date   K 5.1 11/28/2021     3. Gastroesophageal reflux disease with esophagitis without hemorrhage Is on protonix and is work in fine  4. Diverticulitis large intestine w/o perforation or abscess w/o bleeding Has bentyl to take when needed  5. Marijuana abuse She has recently quit her marijuana  6. Leukocytosis, unspecified type Lab Results  Component Value Date   WBC 9.6 11/28/2021   HGB 13.9 11/28/2021   HCT 42.8 11/28/2021   MCV 91.6 11/28/2021   PLT 366 11/28/2021     7. Diverticulitis Again no recent flare up  8. Recurrent major depressive disorder, in partial remission ([place]) Is on lexapro and is doing well. She says it  is mainly because of her current medical condition.    12/29/2021    2:29 PM 08/18/2021   12:22 PM 07/15/2021   11:13 AM  Depression screen PHQ 2/9  Decreased Interest 2 3 0  Down, Depressed, Hopeless 2 2 0  PHQ - 2 Score 4 5 0  Altered sleeping 2 3 2   Tired, decreased energy 2 3 2   Change in appetite 2 3 1   Feeling bad or failure about yourself  1 1 0  Trouble concentrating 2 0 0  Moving slowly or fidgety/restless 1 0 0  Suicidal thoughts 0 0 0  PHQ-9 Score 14 15 5   Difficult doing work/chores Somewhat difficult Very difficult Not difficult at all    Not   9. Anxiety Is on atarax for anxiety. Works okay    12/29/2021    2:29 PM 08/18/2021   12:22 PM 06/30/2021    2:43 PM 11/12/2020    8:02 AM  GAD 7 : Generalized Anxiety Score  Nervous, Anxious, on Edge 3 1 0 1  Control/stop worrying 1 1 0 1  Worry too much - different things 1 1 0 1  Trouble relaxing 3 2 0 1  Restless 2 1 0 1  Easily annoyed or irritable 3 1 2 1   Afraid - awful might happen 0 1 0 1  Total GAD 7 Score 13 8 2 7   Anxiety Difficulty Somewhat difficult Somewhat difficult Somewhat difficult Somewhat difficult      10. Tobacco abuse Smokes a pack a day  11. BMI 28.0-28.9,adult No recent weight changes Wt Readings from Last 3 Encounters:  12/29/21 187 lb (84.8 kg)  11/28/21 186 lb (84.4 kg)  11/26/21 189 lb 14.4 oz (86.1 kg)   BMI Readings from Last 3 Encounters:  12/29/21 30.18 kg/m  11/28/21 30.02 kg/m  11/26/21 30.65 kg/m     New complaints: Has recently been dx with uterine cancer and is getting ready to have hysterectomy. Mammogram was incomplete and requires further testing.  Allergies  Allergen Reactions   Lisinopril     Vertigo   Outpatient Encounter Medications as of 12/29/2021  Medication Sig   acetaminophen (TYLENOL) 500 MG tablet Take 1,000 mg by mouth every 6 (six) hours as needed for moderate pain.   amLODipine (NORVASC) 5 MG tablet Take 1 tablet (5 mg total) by mouth daily.   atorvastatin (LIPITOR) 10 MG tablet Take 10 mg by mouth at bedtime.   dicyclomine (BENTYL) 20 MG tablet Take 20 mg by mouth 3 (three) times daily as needed for spasms.   escitalopram (LEXAPRO) 20 MG tablet Take 1 tablet (20 mg total) by mouth daily.   hydrOXYzine (ATARAX) 10 MG tablet Take 1 tablet (10 mg total) by mouth 3 (three) times daily as needed.   ondansetron (ZOFRAN-ODT) 4 MG disintegrating tablet Take 1 tablet (4 mg total) by mouth every 8 (eight) hours as needed.   pantoprazole (PROTONIX) 40 MG tablet Take 1 tablet (40 mg total) by mouth daily.    senna-docusate (SENOKOT-S) 8.6-50 MG tablet Take 2 tablets by mouth at bedtime. For AFTER surgery, do not take if having diarrhea   traMADol (ULTRAM) 50 MG tablet Take 1 tablet (50 mg total) by mouth every 6 (six) hours as needed for severe pain. For AFTER surgery only, do not take and drive   Vitamin D, Ergocalciferol, (DRISDOL) 1.25 MG (50000 UNIT) CAPS capsule Take 50,000 Units by mouth every Thursday.   No facility-administered encounter medications on file as of 12/29/2021.    Past Surgical History:  Procedure Laterality Date   CERVICAL CONIZATION W/BX N/A 12/03/2021   Procedure: CONIZATION CERVIX WITH BIOPSY; ENDOCERVICAL BIOPSY;  Surgeon: Lafonda Mosses, MD;  Location: [facility];  Service: Gynecology;  Laterality: N/A;   CHOLECYSTECTOMY     ECTOPIC PREGNANCY SURGERY     x2, both tubes removed   OPEN PARTIAL HEPATECTOMY  Right 2017   right lobe benign tumor    Family History  Problem Relation Age of Onset   Hypertension Mother    Colon cancer Father 36       Deceased age 61 from colon ca      Controlled substance contract: n/a      Review of Systems  Constitutional:  Negative for diaphoresis.  Eyes:  Negative for pain.  Respiratory:  Negative for shortness of breath.   Cardiovascular:  Negative for chest pain, palpitations and leg swelling.  Gastrointestinal:  Negative for abdominal pain.  Endocrine: Negative for polydipsia.  Skin:  Negative for rash.  Neurological:  Negative for dizziness, weakness and headaches.  Hematological:  Does not bruise/bleed easily.  All other systems reviewed and are negative.      Objective:   Physical Exam Vitals and nursing note reviewed.  Constitutional:      General: She is not in acute distress.    Appearance: Normal appearance. She is well-developed.  HENT:     Head: Normocephalic.     Right Ear: Tympanic membrane normal.     Left Ear: Tympanic membrane normal.     Nose: Nose normal.     Mouth/Throat:     Mouth:  Mucous membranes are moist.  Eyes:     Pupils: Pupils are equal, round, and reactive to light.  Neck:     Vascular: No carotid bruit or JVD.  Cardiovascular:     Rate and Rhythm: Normal rate and regular rhythm.     Heart sounds: Normal heart sounds.  Pulmonary:     Effort: Pulmonary effort  is normal. No respiratory distress.     Breath sounds: Normal breath sounds. No wheezing or rales.  Chest:     Chest wall: No tenderness.  Abdominal:     General: Bowel sounds are normal. There is no distension or abdominal bruit.     Palpations: Abdomen is soft. There is no hepatomegaly, splenomegaly, mass or pulsatile mass.     Tenderness: There is no abdominal tenderness.  Musculoskeletal:        General: Normal range of motion.     Cervical back: Normal range of motion and neck supple.  Lymphadenopathy:     Cervical: No cervical adenopathy.  Skin:    General: Skin is warm and dry.  Neurological:     Mental Status: She is alert and oriented to person, place, and time.     Deep Tendon Reflexes: Reflexes are normal and symmetric.  Psychiatric:        Behavior: Behavior normal.        Thought Content: Thought content normal.        Judgment: Judgment normal.     BP (!) 141/83   Pulse 63   Temp (!) 96.9 F (36.1 C) (Temporal)   Resp 20   Ht 5' 6"  (1.676 m)   Wt 187 lb (84.8 kg)   SpO2 97%   BMI 30.18 kg/m        Assessment & Plan:  Evonda Enge comes in today with chief complaint of Medical Management of Chronic Issues (Knot on neck/Horrible night sweats. Changing clothes 3 times a night/)   Diagnosis and orders addressed:  1. Essential hypertension Low sodium diet - amLODipine (NORVASC) 5 MG tablet; Take 1 tablet (5 mg total) by mouth daily.  Dispense: 90 tablet; Refill: 1 - atorvastatin (LIPITOR) 10 MG tablet; Take 1 tablet (10 mg total) by mouth at bedtime.  Dispense: 90 tablet; Refill: 1 - CBC with Differential/Platelet - CMP14+EGFR - Lipid panel  2.  Hypokalemia Labs pending  3. Gastroesophageal reflux disease with esophagitis without hemorrhage Avoid spicy foods Do not eat 2 hours prior to bedtime - pantoprazole (PROTONIX) 40 MG tablet; Take 1 tablet (40 mg total) by mouth daily.  Dispense: 90 tablet; Refill: 1  4. Diverticulitis large intestine w/o perforation or abscess w/o bleeding Watch diet  5. Marijuana abuse Do not start back on marijuana  6. Leukocytosis, unspecified type Labs pending  7. Diverticulitis  8. Recurrent major depressive disorder, in partial remission ([place]) Stress management Added wellbutrin to meds - buPROPion (WELLBUTRIN XL) 150 MG 24 hr tablet; Take 1 tablet (150 mg total) by mouth daily.  Dispense: 90 tablet; Refill: 1 - escitalopram (LEXAPRO) 20 MG tablet; Take 1 tablet (20 mg total) by mouth daily.  Dispense: 90 tablet; Refill: 1  9. Anxiety - hydrOXYzine (ATARAX) 10 MG tablet; Take 1 tablet (10 mg total) by mouth 3 (three) times daily as needed.  Dispense: 30 tablet; Refill: 3  10. Tobacco abuse Smoking cessation encouraged  11. BMI 28.0-28.9,adult Discussed diet and exercise for person with BMI >25 Will recheck weight in 3-6 months  Needs to have mammogram repeated- was incomplete  Labs pending Health Maintenance reviewed Diet and exercise encouraged  Follow up plan: 6 months   Cecil-Bishop, FNP

## 2021-12-30 LAB — CBC WITH DIFFERENTIAL/PLATELET
Basophils Absolute: 0.1 10*3/uL (ref 0.0–0.2)
Basos: 1 %
EOS (ABSOLUTE): 0.2 10*3/uL (ref 0.0–0.4)
Eos: 2 %
Hematocrit: 41.7 % (ref 34.0–46.6)
Hemoglobin: 14.1 g/dL (ref 11.1–15.9)
Immature Grans (Abs): 0.1 10*3/uL (ref 0.0–0.1)
Immature Granulocytes: 1 %
Lymphocytes Absolute: 3.2 10*3/uL — ABNORMAL HIGH (ref 0.7–3.1)
Lymphs: 32 %
MCH: 30.1 pg (ref 26.6–33.0)
MCHC: 33.8 g/dL (ref 31.5–35.7)
MCV: 89 fL (ref 79–97)
Monocytes Absolute: 0.7 10*3/uL (ref 0.1–0.9)
Monocytes: 7 %
Neutrophils Absolute: 5.8 10*3/uL (ref 1.4–7.0)
Neutrophils: 57 %
Platelets: 349 10*3/uL (ref 150–450)
RBC: 4.69 x10E6/uL (ref 3.77–5.28)
RDW: 13.5 % (ref 11.7–15.4)
WBC: 9.9 10*3/uL (ref 3.4–10.8)

## 2021-12-30 LAB — CMP14+EGFR
ALT: 21 IU/L (ref 0–32)
AST: 20 IU/L (ref 0–40)
Albumin/Globulin Ratio: 1.7 (ref 1.2–2.2)
Albumin: 4.4 g/dL (ref 3.9–4.9)
Alkaline Phosphatase: 66 IU/L (ref 44–121)
BUN/Creatinine Ratio: 15 (ref 12–28)
BUN: 14 mg/dL (ref 8–27)
Bilirubin Total: 0.3 mg/dL (ref 0.0–1.2)
CO2: 23 mmol/L (ref 20–29)
Calcium: 10 mg/dL (ref 8.7–10.3)
Chloride: 104 mmol/L (ref 96–106)
Creatinine, Ser: 0.96 mg/dL (ref 0.57–1.00)
Globulin, Total: 2.6 g/dL (ref 1.5–4.5)
Glucose: 84 mg/dL (ref 70–99)
Potassium: 4.6 mmol/L (ref 3.5–5.2)
Sodium: 141 mmol/L (ref 134–144)
Total Protein: 7 g/dL (ref 6.0–8.5)
eGFR: 66 mL/min/{1.73_m2} (ref >59)

## 2021-12-30 LAB — LIPID PANEL
Chol/HDL Ratio: 3.5 ratio (ref 0.0–4.4)
Cholesterol, Total: 204 mg/dL — ABNORMAL HIGH (ref 100–199)
HDL: 58 mg/dL (ref >39)
LDL Chol Calc (NIH): 109 mg/dL — ABNORMAL HIGH (ref 0–99)
Triglycerides: 216 mg/dL — ABNORMAL HIGH (ref 0–149)
VLDL Cholesterol Cal: 37 mg/dL (ref 5–40)

## 2021-12-31 ENCOUNTER — Encounter: Payer: Self-pay | Admitting: Gynecologic Oncology

## 2022-01-01 ENCOUNTER — Other Ambulatory Visit: Payer: Self-pay

## 2022-01-01 ENCOUNTER — Encounter: Payer: Self-pay | Admitting: Gynecologic Oncology

## 2022-01-01 ENCOUNTER — Inpatient Hospital Stay: Payer: Medicare HMO | Attending: Gynecologic Oncology | Admitting: Gynecologic Oncology

## 2022-01-01 ENCOUNTER — Inpatient Hospital Stay (HOSPITAL_BASED_OUTPATIENT_CLINIC_OR_DEPARTMENT_OTHER): Payer: Medicare HMO | Admitting: Gynecologic Oncology

## 2022-01-01 VITALS — BP 154/75 | HR 61 | Temp 98.4°F | Resp 18 | Ht 66.0 in | Wt 183.2 lb

## 2022-01-01 DIAGNOSIS — N840 Polyp of corpus uteri: Secondary | ICD-10-CM

## 2022-01-01 DIAGNOSIS — N83209 Unspecified ovarian cyst, unspecified side: Secondary | ICD-10-CM

## 2022-01-01 DIAGNOSIS — R87613 High grade squamous intraepithelial lesion on cytologic smear of cervix (HGSIL): Secondary | ICD-10-CM

## 2022-01-01 MED ORDER — SENNOSIDES-DOCUSATE SODIUM 8.6-50 MG PO TABS: 2.0000 | ORAL_TABLET | Freq: Every day | ORAL | 0 refills | Status: DC

## 2022-01-01 MED ORDER — TRAMADOL HCL 50 MG PO TABS: 50.0000 mg | ORAL_TABLET | Freq: Four times a day (QID) | ORAL | 0 refills | Status: DC | PRN

## 2022-01-01 NOTE — Patient Instructions (Addendum)
Preparing for your Surgery  Plan for surgery on January 14, 2022 with Dr. Jeral Pinch at [facility] will be scheduled for robotic assisted laparoscopic bilateral salpingo-oophorectomy (removal of both ovaries and fallopian tubes), hysteroscopy with dilation and curettage of the uterus (looking into the uterus with a camera and thinning the lining), possible robotic assisted total hysterectomy (removal of the uterus and cervix) if a borderline tumor or cancer is identified, possible staging if a cancer is identified.   Pre-operative Testing -(Done, 01/02/22) You will receive a phone call from presurgical testing at [facility] to arrange for a pre-operative appointment and lab work.  -Bring your insurance card, copy of an advanced directive if applicable, medication list  -At that visit, you will be asked to sign a consent for a possible blood transfusion in case a transfusion becomes necessary during surgery.  The need for a blood transfusion is rare but having consent is a necessary part of your care.     -You should not be taking blood thinners or aspirin at least ten days prior to surgery unless instructed by your surgeon.  -Do not take supplements such as fish oil (omega 3), red yeast rice, turmeric before your surgery. You want to avoid medications with aspirin in them including headache powders such as BC or Goody's), Excedrin migraine.  Day Before Surgery at [place] will be asked to take in a light diet the day before surgery. You will be advised you can have clear liquids up until 3 hours before your surgery.    Eat a light diet the day before surgery.  Examples including soups, broths, toast, yogurt, mashed potatoes.  AVOID GAS PRODUCING FOODS. Things to avoid include carbonated beverages (fizzy beverages, sodas), raw fruits and raw vegetables (uncooked), or beans.   If your bowels are filled with gas, your surgeon will have difficulty visualizing your pelvic  organs which increases your surgical risks.  Your role in recovery Your role is to become active as soon as directed by your doctor, while still giving yourself time to heal.  Rest when you feel tired. You will be asked to do the following in order to speed your recovery:  - Cough and breathe deeply. This helps to clear and expand your lungs and can prevent pneumonia after surgery.  - Center. Do mild physical activity. Walking or moving your legs help your circulation and body functions return to normal. Do not try to get up or walk alone the first time after surgery.   -If you develop swelling on one leg or the other, pain in the back of your leg, redness/warmth in one of your legs, please call the office or go to the Emergency Room to have a doppler to rule out a blood clot. For shortness of breath, chest pain-seek care in the Emergency Room as soon as possible. - Actively manage your pain. Managing your pain lets you move in comfort. We will ask you to rate your pain on a scale of zero to 10. It is your responsibility to tell your doctor or nurse where and how much you hurt so your pain can be treated.  Special Considerations -If you are diabetic, you may be placed on insulin after surgery to have closer control over your blood sugars to promote healing and recovery.  This does not mean that you will be discharged on insulin.  If applicable, your oral antidiabetics will be resumed when you are tolerating  a solid diet.  -Your final pathology results from surgery should be available around one week after surgery and the results will be relayed to you when available.  -FMLA forms can be faxed to [phone] and please allow 5-7 business days for completion.  Pain Management After Surgery -You have been prescribed your pain medication and bowel regimen medications before surgery so that you can have these available when you are discharged from the hospital. The pain  medication is for use ONLY AFTER surgery and a new prescription will not be given.   -Make sure that you have Tylenol and Ibuprofen (use sparingly, this with use of lexapro can increase risk of gastrointestinal bleeding, take with food) IF YOU ARE ABLE TO TAKE THESE MEDICATIONS at home to use on a regular basis after surgery for pain control. We recommend alternating the medications every hour to six hours since they work differently and are processed in the body differently for pain relief.  -Review the attached handout on narcotic use and their risks and side effects.   Bowel Regimen -You have been prescribed Sennakot-S to take nightly to prevent constipation especially if you are taking the narcotic pain medication intermittently.  It is important to prevent constipation and drink adequate amounts of liquids. You can stop taking this medication when you are not taking pain medication and you are back on your normal bowel routine.  Risks of Surgery Risks of surgery are low but include bleeding, infection, damage to surrounding structures, re-operation, blood clots, and very rarely death.   Blood Transfusion Information (For the consent to be signed before surgery)  We will be checking your blood type before surgery so in case of emergencies, we will know what type of blood you would need.                                            WHAT IS A BLOOD TRANSFUSION?  A transfusion is the replacement of blood or some of its parts. Blood is made up of multiple cells which provide different functions. Red blood cells carry oxygen and are used for blood loss replacement. White blood cells fight against infection. Platelets control bleeding. Plasma helps clot blood. Other blood products are available for specialized needs, such as hemophilia or other clotting disorders. BEFORE THE TRANSFUSION  Who gives blood for transfusions?  You may be able to donate blood to be used at a later date on yourself  (autologous donation). Relatives can be asked to donate blood. This is generally not any safer than if you have received blood from a stranger. The same precautions are taken to ensure safety when a relative's blood is donated. Healthy volunteers who are fully evaluated to make sure their blood is safe. This is blood bank blood. Transfusion therapy is the safest it has ever been in the practice of medicine. Before blood is taken from a donor, a complete history is taken to make sure that person has no history of diseases nor engages in risky social behavior (examples are intravenous drug use or sexual activity with multiple partners). The donor's travel history is screened to minimize risk of transmitting infections, such as malaria. The donated blood is tested for signs of infectious diseases, such as HIV and hepatitis. The blood is then tested to be sure it is compatible with you in order to minimize the chance of a  transfusion reaction. If you or a relative donates blood, this is often done in anticipation of surgery and is not appropriate for emergency situations. It takes many days to process the donated blood. RISKS AND COMPLICATIONS Although transfusion therapy is very safe and saves many lives, the main dangers of transfusion include:  Getting an infectious disease. Developing a transfusion reaction. This is an allergic reaction to something in the blood you were given. Every precaution is taken to prevent this. The decision to have a blood transfusion has been considered carefully by your caregiver before blood is given. Blood is not given unless the benefits outweigh the risks.  AFTER SURGERY INSTRUCTIONS  Return to work: 4-6 weeks if applicable  Activity: 1. Be up and out of the bed during the day.  Take a nap if needed.  You may walk up steps but be careful and use the hand rail.  Stair climbing will tire you more than you think, you may need to stop part way and rest.   2. No lifting or  straining for 6 weeks over 10 pounds. No pushing, pulling, straining for 6 weeks.  3. No driving for around 1 week(s).  Do not drive if you are taking narcotic pain medicine and make sure that your reaction time has returned.   4. You can shower as soon as the next day after surgery. Shower daily.  Use your regular soap and water (not directly on the incision) and pat your incision(s) dry afterwards; don't rub.  No tub baths or submerging your body in water until cleared by your surgeon. If you have the soap that was given to you by pre-surgical testing that was used before surgery, you do not need to use it afterwards because this can irritate your incisions.   5. No sexual activity and nothing in the vagina for 4 weeks, 8 weeks if you have a hysterectomy.  6. You may experience a small amount of clear drainage from your incisions, which is normal.  If the drainage persists, increases, or changes color please call the office.  7. Do not use creams, lotions, or ointments such as neosporin on your incisions after surgery until advised by your surgeon because they can cause removal of the dermabond glue on your incisions.    8. You may experience vaginal spotting after surgery or around the 6-8 week mark from surgery when the stitches at the top of the vagina begin to dissolve if you have a hysterectomy.  The spotting is normal but if you experience heavy bleeding, call our office.  9. Take Tylenol or ibuprofen first for pain if you are able to take these medications and only use narcotic pain medication for severe pain not relieved by the Tylenol or Ibuprofen.  Monitor your Tylenol intake to a max of 4,000 mg in a 24 hour period. You can alternate these medications after surgery.  Diet: 1. Low sodium Heart Healthy Diet is recommended but you are cleared to resume your normal (before surgery) diet after your procedure.  2. It is safe to use a laxative, such as Miralax or Colace, if you have  difficulty moving your bowels. You have been prescribed Sennakot-S to take at bedtime every evening after surgery to keep bowel movements regular and to prevent constipation.    Wound Care: 1. Keep clean and dry.  Shower daily.  Reasons to call the Doctor: Fever - Oral temperature greater than 100.4 degrees Fahrenheit Foul-smelling vaginal discharge Difficulty urinating Nausea and vomiting  Increased pain at the site of the incision that is unrelieved with pain medicine. Difficulty breathing with or without chest pain New calf pain especially if only on one side Sudden, continuing increased vaginal bleeding with or without clots.   Contacts: For questions or concerns you should contact:  Dr. Jeral Pinch at [phone]  Joylene John, NP at [phone]  After Hours: call [phone] and have the GYN Oncologist paged/contacted (after 5 pm or on the weekends).  Messages sent via mychart are for non-urgent matters and are not responded to after hours so for urgent needs, please call the after hours number.

## 2022-01-01 NOTE — Patient Instructions (Signed)
DUE TO COVID-19 ONLY TWO VISITORS  (aged 66 and older)  ARE ALLOWED TO COME WITH YOU AND STAY IN THE WAITING ROOM ONLY DURING PRE OP AND PROCEDURE.   **NO VISITORS ARE ALLOWED IN THE SHORT STAY AREA OR RECOVERY ROOM!!**  IF YOU WILL BE ADMITTED INTO THE HOSPITAL YOU ARE ALLOWED ONLY FOUR SUPPORT PEOPLE DURING VISITATION HOURS ONLY (7 AM -8PM)   The support person(s) must pass our screening, gel in and out, and wear a mask at all times, including in the patient's room. Patients must also wear a mask when staff or their support person are in the room. Visitors GUEST BADGE MUST BE WORN VISIBLY  One adult visitor may remain with you overnight and MUST be in the room by 8 P.M.     Your procedure is scheduled on: 01/14/22   Report to [facility] Main Entrance    Report to admitting at  5:30 AM   Call this number if you have problems the morning of surgery [phone]  Light Diet- Full liquid diet The day before surgery  Strained creamy soups Tea, Coffee- with cream or mild and sugar or honey  Juices- cranberry , grape and apple  Jello  Milkshakes  Pudding , custards  Popsicles  Water Plain ice cream f, frozen yogurt, sherbet, plain yogurt  Fruit ices and popsicles with no fruit pulp  Sugar, honey and syrups Clear broths  Boost, Ensure, Resource and other liquid supplements NO CARBONATED BEVERAGES      Do not eat food :After Midnight.Change over to a clear liquid diet.   After Midnight you may have the following liquids until _4:30 AM/ DAY OF SURGERY  Water Black Coffee (sugar ok, NO MILK/CREAM OR CREAMERS)  Tea (sugar ok, NO MILK/CREAM OR CREAMERS) regular and decaf                             Plain Jell-O (NO RED)                                           Fruit ices (not with fruit pulp, NO RED)                                     Popsicles (NO RED)                                                                  Juice: apple, WHITE grape, WHITE cranberry Sports  drinks like Gatorade (NO RED)                          If you have questions, please contact your surgeon's office.     Oral Hygiene is also important to reduce your risk of infection.                                    Remember - BRUSH YOUR TEETH THE MORNING OF  SURGERY WITH YOUR REGULAR TOOTHPASTE   Do NOT smoke after Midnight   Take these medicines the morning of surgery with A SIP OF WATER: Bupropion, Lexapro, Lipitor, Amlodipine                                You may not have any metal on your body including hair pins, jewelry, and body piercing             Do not wear make-up, lotions, powders, perfumes/cologne, or deodorant  Do not wear nail polish including gel and S&S, artificial/acrylic nails, or any other type of covering on natural nails including finger and toenails. If you have artificial nails, gel coating, etc. that needs to be removed by a nail salon please have this removed prior to surgery or surgery may need to be canceled/ delayed if the surgeon/ anesthesia feels like they are unable to be safely monitored.   Do not shave  48 hours prior to surgery.                Do not bring valuables to the hospital. [place].   Contacts, dentures or bridgework may not be worn into surgery.   Bring small overnight bag day of surgery.   DO NOT [place].    Patients discharged on the day of surgery will not be allowed to drive home.  Someone NEEDS to stay with you for the first 24 hours after anesthesia.   Special Instructions: Bring a copy of your healthcare power of attorney and living will documents  the day of surgery if you haven't scanned them before.              Please read over the following fact sheets you were given: IF YOU HAVE QUESTIONS ABOUT YOUR PRE-OP INSTRUCTIONS PLEASE CALL [phone]     [facility] - Preparing for Surgery Before surgery, you can play an important  role.  Because skin is not sterile, your skin needs to be as free of germs as possible.  You can reduce the number of germs on your skin by washing with CHG (chlorahexidine gluconate) soap before surgery.  CHG is an antiseptic cleaner which kills germs and bonds with the skin to continue killing germs even after washing. Please DO NOT use if you have an allergy to CHG or antibacterial soaps.  If your skin becomes reddened/irritated stop using the CHG and inform your nurse when you arrive at Short Stay. Do not shave (including legs and underarms) for at least 48 hours prior to the first CHG shower.   Please follow these instructions carefully:  1.  Shower with CHG Soap the night before surgery and the  morning of Surgery.  2.  If you choose to wash your hair, wash your hair first as usual with your  normal  shampoo.  3.  After you shampoo, rinse your hair and body thoroughly to remove the  shampoo.                            4.  Use CHG as you would any other liquid soap.  You can apply chg directly  to the skin and wash  Gently with a scrungie or clean washcloth.  5.  Apply the CHG Soap to your body ONLY FROM THE NECK DOWN.   Do not use on face/ open                           Wound or open sores. Avoid contact with eyes, ears mouth and genitals (private parts).                       Wash face,  Genitals (private parts) with your normal soap.             6.  Wash thoroughly, paying special attention to the area where your surgery  will be performed.  7.  Thoroughly rinse your body with warm water from the neck down.  8.  DO NOT shower/wash with your normal soap after using and rinsing off  the CHG Soap.                9.  Pat yourself dry with a clean towel.            10.  Wear clean pajamas.            11.  Place clean sheets on your bed the night of your first shower and do not  sleep with pets. Day of Surgery : Do not apply any lotions/deodorants the morning of surgery.   Please wear clean clothes to the hospital/surgery center.  FAILURE TO FOLLOW THESE INSTRUCTIONS MAY RESULT IN THE CANCELLATION OF YOUR SURGERY   ________________________________________________________________________

## 2022-01-01 NOTE — Progress Notes (Signed)
Patient here for follow up with Dr. Jeral Pinch and for a pre-operative discussion prior to her scheduled surgery on January 14, 2022. She is scheduled for robotic assisted laparoscopic bilateral salpingo-oophorectomy, hysteroscopy with dilation and curettage of the uterus, possible robotic assisted total hysterectomy if a borderline tumor or cancer is identified, possible staging if a cancer is identified.  She has her pre-admission testing appointment tomorrow at [facility].  The surgery was discussed in detail.  See after visit summary for additional details. Visual aids used to discuss items related to surgery.     Discussed post-op pain management in detail including the aspects of the enhanced recovery pathway.  Advised her that a new prescription would be sent in for tramadol and it is only to be used for after her upcoming surgery.  We discussed the use of tylenol post-op and to monitor for a maximum of 4,000 mg in a 24 hour period.  Also prescribed sennakot to be used after surgery and to hold if having loose stools.  Discussed bowel regimen in detail.     Discussed measures to take at home to prevent DVT including frequent mobility.  Reportable signs and symptoms of DVT discussed. Post-operative instructions discussed and expectations for after surgery. Incisional care discussed as well including reportable signs and symptoms including erythema, drainage, wound separation.     5 minutes spent with the patient.  Verbalizing understanding of material discussed. No needs or concerns voiced at the end of the visit.   Advised patient to call for any needs.  Advised that her post-operative medications had been prescribed and could be picked up at any time.    This appointment is included in the global surgical bundle as pre-operative teaching and has no charge.

## 2022-01-01 NOTE — H&P (View-Only) (Signed)
Gynecologic Oncology Return Clinic Visit  01/01/2022  Reason for Visit: Follow-up after recent surgery, treatment planning  Treatment History: Pelvic ultrasound on 10/29/21 showed a Uterus 5.7 x 2.8 x 3.6 cm, endometrial lining 9.4 mm. 10.1 x 9.5 x 9.3 cm right adnexa with 9.1 cm simple cyst. Left ovary normal in appearance.    Patient was seen by her OB/GYN on 11/03/2021.  At that time, vaginal swabs were collected which showed no chlamydia, trichomonas, bacterial vaginosis, or gonorrhea.  Candida was noted.  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  High risk HPV by mRNA E6/A7 was not detected.  The patient has not yet been contacted about returning for any additional work-up given her abnormal Pap smear.  12/03/21: endometrial biopsy, cervical biopsies, cold knife conization of cervix, post-cone ECC  Interval History: Doing well.  Denies any current vaginal bleeding or discharge.  She endorses several weeks of vaginal bleeding or spotting after her cold knife cone. She thinks that she has felt the string intermittently at the outside of her vagina.  Has noticed some night sweats, which she has had previously related to any sort of trauma.  When she has had recent episodes of vomiting and weakness, she has developed night sweats.  These have lasted longer than her other episodes.  Reports regular bowel and bladder function.  Given asymmetries in the left breast noted on screening mammogram in February, patient was post to have diagnostic mammogram.  This is now scheduled for 8/14 as well as breast ultrasound.  Past Medical/Surgical History: Past Medical History:  Diagnosis Date   Anxiety    Arthritis    GERD (gastroesophageal reflux disease)    History of hiatal hernia    History of kidney stones    Hypertension    Liver mass    S/P resection and partial hepatioma 2017 at [facility]   Pneumonia     Past Surgical History:  Procedure Laterality Date   CERVICAL  CONIZATION W/BX N/A 12/03/2021   Procedure: CONIZATION CERVIX WITH BIOPSY; ENDOCERVICAL BIOPSY;  Surgeon: Lafonda Mosses, MD;  Location: [facility] ORS;  Service: Gynecology;  Laterality: N/A;   CHOLECYSTECTOMY     ECTOPIC PREGNANCY SURGERY     x2, both tubes removed   OPEN PARTIAL HEPATECTOMY  Right 2017   right lobe benign tumor    Family History  Problem Relation Age of Onset   Hypertension Mother    Colon cancer Father 63       Deceased age 75 from colon ca    Social History   Socioeconomic History   Marital status: Married    Spouse name: Hoy Morn   Number of children: 1   Years of education: 14   Highest education level: Associate degree: academic program  Occupational History   Not on file  Tobacco Use   Smoking status: Every Day    Packs/day: 1.00    Years: 50.00    Total pack years: 50.00    Types: Cigarettes   Smokeless tobacco: Never  Vaping Use   Vaping Use: Never used  Substance and Sexual Activity   Alcohol use: Yes    Alcohol/week: 14.0 standard drinks of alcohol    Types: 14 Glasses of wine per week    Comment: one a day with dinner   Drug use: Yes    Frequency: 7.0 times per week    Types: Marijuana   Sexual activity: Not Currently  Other Topics Concern   Not on file  Social History Narrative   Not on file   Social Determinants of Health   Financial Resource Strain: Medium Risk (07/15/2021)   Overall Financial Resource Strain (CARDIA)    Difficulty of Paying Living Expenses: Somewhat hard  Food Insecurity: No Food Insecurity (07/15/2021)   Hunger Vital Sign    Worried About Running Out of Food in the Last Year: Never true    Ran Out of Food in the Last Year: Never true  Transportation Needs: No Transportation Needs (07/15/2021)   PRAPARE - Hydrologist (Medical): No    Lack of Transportation (Non-Medical): No  Physical Activity: Inactive (07/15/2021)   Exercise Vital Sign    Days of Exercise per Week: 0 days    Minutes  of Exercise per Session: 0 min  Stress: No Stress Concern Present (07/15/2021)   [place]    Feeling of Stress : Not at all  Social Connections: Moderately Isolated (07/15/2021)   Social Connection and Isolation Panel [NHANES]    Frequency of Communication with Friends and Family: More than three times a week    Frequency of Social Gatherings with Friends and Family: More than three times a week    Attends Religious Services: Never    Marine scientist or Organizations: No    Attends Archivist Meetings: Never    Marital Status: Married    Current Medications:  Current Outpatient Medications:    amLODipine (NORVASC) 5 MG tablet, Take 1 tablet (5 mg total) by mouth daily., Disp: 90 tablet, Rfl: 1   atorvastatin (LIPITOR) 10 MG tablet, Take 1 tablet (10 mg total) by mouth at bedtime., Disp: 90 tablet, Rfl: 1   buPROPion (WELLBUTRIN XL) 150 MG 24 hr tablet, Take 1 tablet (150 mg total) by mouth daily., Disp: 90 tablet, Rfl: 1   escitalopram (LEXAPRO) 20 MG tablet, Take 1 tablet (20 mg total) by mouth daily., Disp: 90 tablet, Rfl: 1   famotidine (PEPCID) 20 MG tablet, Take 20 mg by mouth 2 (two) times daily., Disp: , Rfl:    hydrOXYzine (ATARAX) 10 MG tablet, Take 1 tablet (10 mg total) by mouth 3 (three) times daily as needed., Disp: 30 tablet, Rfl: 3   ondansetron (ZOFRAN-ODT) 4 MG disintegrating tablet, Take 1 tablet (4 mg total) by mouth every 8 (eight) hours as needed., Disp: 20 tablet, Rfl: 0   pantoprazole (PROTONIX) 40 MG tablet, Take 1 tablet (40 mg total) by mouth daily., Disp: 90 tablet, Rfl: 1   Vitamin D, Ergocalciferol, (DRISDOL) 1.25 MG (50000 UNIT) CAPS capsule, Take 50,000 Units by mouth every Thursday., Disp: , Rfl:    senna-docusate (SENOKOT-S) 8.6-50 MG tablet, Take 2 tablets by mouth at bedtime. For AFTER surgery, do not take if having diarrhea, Disp: 30 tablet, Rfl: 0   traMADol (ULTRAM) 50  MG tablet, Take 1 tablet (50 mg total) by mouth every 6 (six) hours as needed for severe pain. For AFTER surgery only, do not take and drive, Disp: 10 tablet, Rfl: 0  Review of Systems: Denies appetite changes, fevers, chills, fatigue, unexplained weight changes. Denies hearing loss, neck lumps or masses, mouth sores, ringing in ears or voice changes. Denies cough or wheezing.  Denies shortness of breath. Denies chest pain or palpitations. Denies leg swelling. Denies abdominal distention, pain, blood in stools, constipation, diarrhea, nausea, vomiting, or early satiety. Denies pain with intercourse, dysuria, frequency, hematuria or incontinence. Denies pelvic pain, vaginal bleeding  or vaginal discharge.   Denies joint pain, back pain or muscle pain/cramps. Denies itching, rash, or wounds. Denies dizziness, headaches, numbness or seizures. Denies swollen lymph nodes or glands, denies easy bruising or bleeding. Denies anxiety, depression, confusion, or decreased concentration.  Physical Exam: BP (!) 154/75 (BP Location: Left Arm, Patient Position: Sitting)   Pulse 61   Temp 98.4 F (36.9 C) (Oral)   Resp 18   Ht '5\' 6"'$  (1.676 m)   Wt 183 lb 3.2 oz (83.1 kg)   SpO2 99%   BMI 29.57 kg/m  General: Alert, oriented, no acute distress. HEENT: Normocephalic, atraumatic, sclera anicteric. Chest: Unlabored breathing on room air. Cardiovascular: Regular rate and rhythm, no murmurs. Abdomen: soft, nontender.  Normoactive bowel sounds.  No masses or hepatosplenomegaly appreciated.   Extremities: Grossly normal range of motion.  Warm, well perfused.  No edema bilaterally.  GU: Normal appearing external genitalia without erythema, excoriation, or lesions.  Speculum exam reveals no blood or discharge.  Cervix healed well after recent conization.  Approximately 6 cm suture noted at the right aspect of the cervix, excised today.  Bimanual exam reveals cervix is not firm or nodular, small mobile uterus,  mobile smooth mass filling the cul-de-sac.   Laboratory & Radiologic Studies: A. ENDOMETRIUM, BIOPSY:  Benign endometrial polyp with cystic glands.  Negative for hyperplasia and malignancy.   B. ENDOCERVIX, BIOPSY:  High-grade SIL (CIN 3/ Severe dysplasia).   C. CERVIX, 1 OCLOCK, BIOPSY:  Hyperkeratosis.  Negative for SIL.   D. CERVIX, 4 OCLOCK, BIOPSY:  Normal ectocervical epithelium.  Negative for SIL.   E. CERVIX, CONE:  Minute foci of high-grade SIL (CIN 3/ severe dysplasia), at the  squamocolumnar junction.  Negative for invasive carcinoma and glandular neoplasia.  The ectocervical and endo cervical margins of resection are widely free  of neoplastic changes.   F. ENDOCERVIX, POST CONE, CURETTAGE:  Normal endocervical tissue.  Negative for SIL and glandular neoplasia.   Assessment & Plan: Theresa Reyes is a 66 y.o. woman with simple appearing adnexal mass and high-grade dysplasia of the cervix.  Recent excisional procedure with focal high-grade dysplasia, widely negative margins.  Patient is overall healing very well from her recent conization procedure.  We discussed pathology results in detail.  There was a small focus of high-grade squamous intraepithelial neoplasia on endocervical biopsy taken prior to the cone as well as at the squamocolumnar junction from the cone biopsy.  Margins were widely negative and post cone ECC was negative for any dysplasia.  Discussed that this has adequately treated her high-grade dysplasia.  Hysterectomy for primary treatment of high-grade dysplasia is not recommended.  I reviewed that the risk of recurrence of HPV related dysplasia is approximately the same whether the patient undergoes hysterectomy or not.  This dysplasia can be recurrent on the cervix or on the vagina (as can happen after hysterectomy).  The patient will need long-term follow-up regardless of whether she undergoes hysterectomy given this high-grade dysplasia history.  After  discussing the risks and benefits of hysterectomy, the patient opted not to proceed with hysterectomy at this time.  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  In terms of her adnexal mass, we have previously discussed excision given its size although simple appearance.  Patient would like to move forward with robotic assisted bilateral salpingo-oophorectomy.  Will plan to send the enlarged adnexa for frozen section.  If benign, then no additional procedures would be performed.  If borderline tumor identified, then we would proceed with total hysterectomy, peritoneal biopsies, and omentectomy.  In the setting of malignancy, additional lymph node sampling would be performed.  Given polypoid tissue found on endometrial biopsy and thickened lining noted on preoperative imaging, I also offered hysteroscopy with D&C at the time of her BSO.  We discussed for plan for a robotic assisted bilateral salpingo-oophorectomy, hysteroscopy with D&C, possible staging including hysterectomy, possible laparotomy. The risks of surgery were discussed in detail and she understands these to include infection; wound separation; hernia; vaginal cuff separation, injury to adjacent organs such as bowel, bladder, blood vessels, ureters and nerves; bleeding which may require blood transfusion; anesthesia risk; thromboembolic events; possible death; unforeseen complications; possible need for re-exploration; medical complications such as heart attack, stroke, pleural effusion and pneumonia; and, if full lymphadenectomy is performed the risk of lymphedema and lymphocyst. The patient will receive DVT and antibiotic prophylaxis as indicated. She voiced a clear understanding. She had the opportunity to ask questions. Perioperative instructions were reviewed with her. Prescriptions for post-op medications were sent to her pharmacy of choice.  28 minutes of total time was spent for this patient  encounter, including preparation, face-to-face counseling with the patient and coordination of care, and documentation of the encounter.  Jeral Pinch, MD  Division of Gynecologic Oncology  Department of Obstetrics and Gynecology  [facility]

## 2022-01-01 NOTE — Progress Notes (Signed)
Gynecologic Oncology Return Clinic Visit  01/01/2022  Reason for Visit: Follow-up after recent surgery, treatment planning  Treatment History: Pelvic ultrasound on 10/29/21 showed a Uterus 5.7 x 2.8 x 3.6 cm, endometrial lining 9.4 mm. 10.1 x 9.5 x 9.3 cm right adnexa with 9.1 cm simple cyst. Left ovary normal in appearance.    Patient was seen by her OB/GYN on 11/03/2021.  At that time, vaginal swabs were collected which showed no chlamydia, trichomonas, bacterial vaginosis, or gonorrhea.  Candida was noted.  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  High risk HPV by mRNA E6/A7 was not detected.  The patient has not yet been contacted about returning for any additional work-up given her abnormal Pap smear.  12/03/21: endometrial biopsy, cervical biopsies, cold knife conization of cervix, post-cone ECC  Interval History: Doing well.  Denies any current vaginal bleeding or discharge.  She endorses several weeks of vaginal bleeding or spotting after her cold knife cone. She thinks that she has felt the string intermittently at the outside of her vagina.  Has noticed some night sweats, which she has had previously related to any sort of trauma.  When she has had recent episodes of vomiting and weakness, she has developed night sweats.  These have lasted longer than her other episodes.  Reports regular bowel and bladder function.  Given asymmetries in the left breast noted on screening mammogram in February, patient was post to have diagnostic mammogram.  This is now scheduled for 8/14 as well as breast ultrasound.  Past Medical/Surgical History: Past Medical History:  Diagnosis Date   Anxiety    Arthritis    GERD (gastroesophageal reflux disease)    History of hiatal hernia    History of kidney stones    Hypertension    Liver mass    S/P resection and partial hepatioma 2017 at [facility]   Pneumonia     Past Surgical History:  Procedure Laterality Date   CERVICAL  CONIZATION W/BX N/A 12/03/2021   Procedure: CONIZATION CERVIX WITH BIOPSY; ENDOCERVICAL BIOPSY;  Surgeon: Lafonda Mosses, MD;  Location: [facility];  Service: Gynecology;  Laterality: N/A;   CHOLECYSTECTOMY     ECTOPIC PREGNANCY SURGERY     x2, both tubes removed   OPEN PARTIAL HEPATECTOMY  Right 2017   right lobe benign tumor    Family History  Problem Relation Age of Onset   Hypertension Mother    Colon cancer Father 77       Deceased age 42 from colon ca    Social History   Socioeconomic History   Marital status: Married    Spouse name: Hoy Morn   Number of children: 1   Years of education: 14   Highest education level: Associate degree: academic program  Occupational History   Not on file  Tobacco Use   Smoking status: Every Day    Packs/day: 1.00    Years: 50.00    Total pack years: 50.00    Types: Cigarettes   Smokeless tobacco: Never  Vaping Use   Vaping Use: Never used  Substance and Sexual Activity   Alcohol use: Yes    Alcohol/week: 14.0 standard drinks of alcohol    Types: 14 Glasses of wine per week    Comment: one a day with dinner   Drug use: Yes    Frequency: 7.0 times per week    Types: Marijuana   Sexual activity: Not Currently  Other Topics Concern   Not on file  Social History Narrative   Not on file   Social Determinants of Health   Financial Resource Strain: Medium Risk (07/15/2021)   Overall Financial Resource Strain (CARDIA)    Difficulty of Paying Living Expenses: Somewhat hard  Food Insecurity: No Food Insecurity (07/15/2021)   Hunger Vital Sign    Worried About Running Out of Food in the Last Year: Never true    Ran Out of Food in the Last Year: Never true  Transportation Needs: No Transportation Needs (07/15/2021)   PRAPARE - Hydrologist (Medical): No    Lack of Transportation (Non-Medical): No  Physical Activity: Inactive (07/15/2021)   Exercise Vital Sign    Days of Exercise per Week: 0 days    Minutes  of Exercise per Session: 0 min  Stress: No Stress Concern Present (07/15/2021)   Mutual    Feeling of Stress : Not at all  Social Connections: Moderately Isolated (07/15/2021)   Social Connection and Isolation Panel [NHANES]    Frequency of Communication with Friends and Family: More than three times a week    Frequency of Social Gatherings with Friends and Family: More than three times a week    Attends Religious Services: Never    Marine scientist or Organizations: No    Attends Archivist Meetings: Never    Marital Status: Married    Current Medications:  Current Outpatient Medications:    amLODipine (NORVASC) 5 MG tablet, Take 1 tablet (5 mg total) by mouth daily., Disp: 90 tablet, Rfl: 1   atorvastatin (LIPITOR) 10 MG tablet, Take 1 tablet (10 mg total) by mouth at bedtime., Disp: 90 tablet, Rfl: 1   buPROPion (WELLBUTRIN XL) 150 MG 24 hr tablet, Take 1 tablet (150 mg total) by mouth daily., Disp: 90 tablet, Rfl: 1   escitalopram (LEXAPRO) 20 MG tablet, Take 1 tablet (20 mg total) by mouth daily., Disp: 90 tablet, Rfl: 1   famotidine (PEPCID) 20 MG tablet, Take 20 mg by mouth 2 (two) times daily., Disp: , Rfl:    hydrOXYzine (ATARAX) 10 MG tablet, Take 1 tablet (10 mg total) by mouth 3 (three) times daily as needed., Disp: 30 tablet, Rfl: 3   ondansetron (ZOFRAN-ODT) 4 MG disintegrating tablet, Take 1 tablet (4 mg total) by mouth every 8 (eight) hours as needed., Disp: 20 tablet, Rfl: 0   pantoprazole (PROTONIX) 40 MG tablet, Take 1 tablet (40 mg total) by mouth daily., Disp: 90 tablet, Rfl: 1   Vitamin D, Ergocalciferol, (DRISDOL) 1.25 MG (50000 UNIT) CAPS capsule, Take 50,000 Units by mouth every Thursday., Disp: , Rfl:    senna-docusate (SENOKOT-S) 8.6-50 MG tablet, Take 2 tablets by mouth at bedtime. For AFTER surgery, do not take if having diarrhea, Disp: 30 tablet, Rfl: 0   traMADol (ULTRAM) 50  MG tablet, Take 1 tablet (50 mg total) by mouth every 6 (six) hours as needed for severe pain. For AFTER surgery only, do not take and drive, Disp: 10 tablet, Rfl: 0  Review of Systems: Denies appetite changes, fevers, chills, fatigue, unexplained weight changes. Denies hearing loss, neck lumps or masses, mouth sores, ringing in ears or voice changes. Denies cough or wheezing.  Denies shortness of breath. Denies chest pain or palpitations. Denies leg swelling. Denies abdominal distention, pain, blood in stools, constipation, diarrhea, nausea, vomiting, or early satiety. Denies pain with intercourse, dysuria, frequency, hematuria or incontinence. Denies pelvic pain, vaginal bleeding  or vaginal discharge.   Denies joint pain, back pain or muscle pain/cramps. Denies itching, rash, or wounds. Denies dizziness, headaches, numbness or seizures. Denies swollen lymph nodes or glands, denies easy bruising or bleeding. Denies anxiety, depression, confusion, or decreased concentration.  Physical Exam: BP (!) 154/75 (BP Location: Left Arm, Patient Position: Sitting)   Pulse 61   Temp 98.4 F (36.9 C) (Oral)   Resp 18   Ht '5\' 6"'$  (1.676 m)   Wt 183 lb 3.2 oz (83.1 kg)   SpO2 99%   BMI 29.57 kg/m  General: Alert, oriented, no acute distress. HEENT: Normocephalic, atraumatic, sclera anicteric. Chest: Unlabored breathing on room air. Cardiovascular: Regular rate and rhythm, no murmurs. Abdomen: soft, nontender.  Normoactive bowel sounds.  No masses or hepatosplenomegaly appreciated.   Extremities: Grossly normal range of motion.  Warm, well perfused.  No edema bilaterally.  GU: Normal appearing external genitalia without erythema, excoriation, or lesions.  Speculum exam reveals no blood or discharge.  Cervix healed well after recent conization.  Approximately 6 cm suture noted at the right aspect of the cervix, excised today.  Bimanual exam reveals cervix is not firm or nodular, small mobile uterus,  mobile smooth mass filling the cul-de-sac.   Laboratory & Radiologic Studies: A. ENDOMETRIUM, BIOPSY:  Benign endometrial polyp with cystic glands.  Negative for hyperplasia and malignancy.   B. ENDOCERVIX, BIOPSY:  High-grade SIL (CIN 3/ Severe dysplasia).   C. CERVIX, 1 OCLOCK, BIOPSY:  Hyperkeratosis.  Negative for SIL.   D. CERVIX, 4 OCLOCK, BIOPSY:  Normal ectocervical epithelium.  Negative for SIL.   E. CERVIX, CONE:  Minute foci of high-grade SIL (CIN 3/ severe dysplasia), at the  squamocolumnar junction.  Negative for invasive carcinoma and glandular neoplasia.  The ectocervical and endo cervical margins of resection are widely free  of neoplastic changes.   F. ENDOCERVIX, POST CONE, CURETTAGE:  Normal endocervical tissue.  Negative for SIL and glandular neoplasia.   Assessment & Plan: Theresa Reyes is a 66 y.o. woman with simple appearing adnexal mass and high-grade dysplasia of the cervix.  Recent excisional procedure with focal high-grade dysplasia, widely negative margins.  Patient is overall healing very well from her recent conization procedure.  We discussed pathology results in detail.  There was a small focus of high-grade squamous intraepithelial neoplasia on endocervical biopsy taken prior to the cone as well as at the squamocolumnar junction from the cone biopsy.  Margins were widely negative and post cone ECC was negative for any dysplasia.  Discussed that this has adequately treated her high-grade dysplasia.  Hysterectomy for primary treatment of high-grade dysplasia is not recommended.  I reviewed that the risk of recurrence of HPV related dysplasia is approximately the same whether the patient undergoes hysterectomy or not.  This dysplasia can be recurrent on the cervix or on the vagina (as can happen after hysterectomy).  The patient will need long-term follow-up regardless of whether she undergoes hysterectomy given this high-grade dysplasia history.  After  discussing the risks and benefits of hysterectomy, the patient opted not to proceed with hysterectomy at this time.  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  In terms of her adnexal mass, we have previously discussed excision given its size although simple appearance.  Patient would like to move forward with robotic assisted bilateral salpingo-oophorectomy.  Will plan to send the enlarged adnexa for frozen section.  If benign, then no additional procedures would be performed.  If borderline tumor identified, then we would proceed with total hysterectomy, peritoneal biopsies, and omentectomy.  In the setting of malignancy, additional lymph node sampling would be performed.  Given polypoid tissue found on endometrial biopsy and thickened lining noted on preoperative imaging, I also offered hysteroscopy with D&C at the time of her BSO.  We discussed for plan for a robotic assisted bilateral salpingo-oophorectomy, hysteroscopy with D&C, possible staging including hysterectomy, possible laparotomy. The risks of surgery were discussed in detail and she understands these to include infection; wound separation; hernia; vaginal cuff separation, injury to adjacent organs such as bowel, bladder, blood vessels, ureters and nerves; bleeding which may require blood transfusion; anesthesia risk; thromboembolic events; possible death; unforeseen complications; possible need for re-exploration; medical complications such as heart attack, stroke, pleural effusion and pneumonia; and, if full lymphadenectomy is performed the risk of lymphedema and lymphocyst. The patient will receive DVT and antibiotic prophylaxis as indicated. She voiced a clear understanding. She had the opportunity to ask questions. Perioperative instructions were reviewed with her. Prescriptions for post-op medications were sent to her pharmacy of choice.  28 minutes of total time was spent for this patient  encounter, including preparation, face-to-face counseling with the patient and coordination of care, and documentation of the encounter.  Jeral Pinch, MD  Division of Gynecologic Oncology  Department of Obstetrics and Gynecology  [facility]

## 2022-01-02 ENCOUNTER — Encounter (HOSPITAL_COMMUNITY)
Admission: RE | Admit: 2022-01-02 | Discharge: 2022-01-02 | Disposition: A | Discharge status: Home / Self Care | Payer: Medicare HMO | Source: Ambulatory Visit | Attending: Gynecologic Oncology | Admitting: Gynecologic Oncology

## 2022-01-02 ENCOUNTER — Other Ambulatory Visit: Payer: Self-pay

## 2022-01-02 ENCOUNTER — Encounter (HOSPITAL_COMMUNITY): Payer: Self-pay

## 2022-01-02 DIAGNOSIS — R87613 High grade squamous intraepithelial lesion on cytologic smear of cervix (HGSIL): Secondary | ICD-10-CM | POA: Insufficient documentation

## 2022-01-02 DIAGNOSIS — Z01812 Encounter for preprocedural laboratory examination: Secondary | ICD-10-CM | POA: Insufficient documentation

## 2022-01-02 DIAGNOSIS — N9489 Other specified conditions associated with female genital organs and menstrual cycle: Secondary | ICD-10-CM | POA: Diagnosis not present

## 2022-01-02 HISTORY — DX: Gastric ulcer, unspecified as acute or chronic, without hemorrhage or perforation: K25.9

## 2022-01-02 HISTORY — DX: Unspecified abdominal hernia without obstruction or gangrene: K46.9

## 2022-01-02 HISTORY — DX: Diverticulosis of intestine, part unspecified, without perforation or abscess without bleeding: K57.90

## 2022-01-02 LAB — COMPREHENSIVE METABOLIC PANEL
ALT: 20 U/L (ref 0–44)
AST: 15 U/L (ref 15–41)
Albumin: 4.3 g/dL (ref 3.5–5.0)
Alkaline Phosphatase: 54 U/L (ref 38–126)
Anion gap: 6 (ref 5–15)
BUN: 17 mg/dL (ref 8–23)
CO2: 23 mmol/L (ref 22–32)
Calcium: 9.5 mg/dL (ref 8.9–10.3)
Chloride: 110 mmol/L (ref 98–111)
Creatinine, Ser: 0.8 mg/dL (ref 0.44–1.00)
GFR, Estimated: >60 mL/min (ref >60)
Glucose, Bld: 95 mg/dL (ref 70–99)
Potassium: 4.3 mmol/L (ref 3.5–5.1)
Sodium: 139 mmol/L (ref 135–145)
Total Bilirubin: 0.5 mg/dL (ref 0.3–1.2)
Total Protein: 7.4 g/dL (ref 6.5–8.1)

## 2022-01-02 LAB — TYPE AND SCREEN
ABO/RH(D): A POS
Antibody Screen: NEGATIVE

## 2022-01-02 LAB — CBC
HCT: 43 % (ref 36.0–46.0)
Hemoglobin: 13.7 g/dL (ref 12.0–15.0)
MCH: 29.3 pg (ref 26.0–34.0)
MCHC: 31.9 g/dL (ref 30.0–36.0)
MCV: 91.9 fL (ref 80.0–100.0)
Platelets: 304 10*3/uL (ref 150–400)
RBC: 4.68 MIL/uL (ref 3.87–5.11)
RDW: 14.5 % (ref 11.5–15.5)
WBC: 8.4 10*3/uL (ref 4.0–10.5)
nRBC: 0 % (ref 0.0–0.2)

## 2022-01-02 NOTE — Progress Notes (Signed)
Anesthesia note:  Bowel prep reminder:NA  PCP - Ronnald Collum FNP Cardiologist -none Other-   Chest x-ray - no EKG - 07/15/21-epic Stress Test - no ECHO - no Cardiac Cath - NA  Pacemaker/ICD device last checked:NA  Sleep Study - no CPAP -   Pt is pre diabetic-NA Fasting Blood Sugar -  Checks Blood Sugar _____  Blood Thinner:NA Blood Thinner Instructions: Aspirin Instructions: Last Dose:  Anesthesia review: no  Patient denies shortness of breath, fever, cough and chest pain at PAT appointment Pt has no SOB with activities.  Patient verbalized understanding of instructions that were given to them at the PAT appointment. Patient was also instructed that they will need to review over the PAT instructions again at home before surgery. yes

## 2022-01-05 ENCOUNTER — Ambulatory Visit
Admission: RE | Admit: 2022-01-05 | Discharge: 2022-01-05 | Disposition: A | Discharge status: Home / Self Care | Payer: Medicare HMO | Source: Ambulatory Visit | Attending: Nurse Practitioner | Admitting: Nurse Practitioner

## 2022-01-05 DIAGNOSIS — R928 Other abnormal and inconclusive findings on diagnostic imaging of breast: Secondary | ICD-10-CM

## 2022-01-05 DIAGNOSIS — N6489 Other specified disorders of breast: Secondary | ICD-10-CM | POA: Diagnosis not present

## 2022-01-13 ENCOUNTER — Telehealth: Payer: Self-pay

## 2022-01-13 NOTE — Anesthesia Preprocedure Evaluation (Signed)
Anesthesia Evaluation  Patient identified by MRN, date of birth, ID band Patient awake    Reviewed: Allergy & Precautions, NPO status , Patient's Chart, lab work & pertinent test results  Airway Mallampati: II  TM Distance: >3 FB Neck ROM: Full    Dental  (+) Chipped,    Pulmonary Current Smoker and Patient abstained from smoking.,    Pulmonary exam normal        Cardiovascular hypertension, Pt. on medications Normal cardiovascular exam     Neuro/Psych PSYCHIATRIC DISORDERS Anxiety Depression negative neurological ROS     GI/Hepatic Neg liver ROS, hiatal hernia, PUD, GERD  Medicated and Controlled,  Endo/Other  negative endocrine ROS  Renal/GU negative Renal ROS     Musculoskeletal  (+) Arthritis ,   Abdominal   Peds  Hematology negative hematology ROS (+)   Anesthesia Other Findings ADNEXAL MASS, ENDOMETRIAL POLYP  Reproductive/Obstetrics                            Anesthesia Physical Anesthesia Plan  ASA: 3  Anesthesia Plan: General   Post-op Pain Management:    Induction: Intravenous  PONV Risk Score and Plan: 3 and Ondansetron, Dexamethasone, Midazolam and Treatment may vary due to age or medical condition  Airway Management Planned: Oral ETT  Additional Equipment:   Intra-op Plan:   Post-operative Plan: Extubation in OR  Informed Consent: I have reviewed the patients History and Physical, chart, labs and discussed the procedure including the risks, benefits and alternatives for the proposed anesthesia with the patient or authorized representative who has indicated his/her understanding and acceptance.     Dental advisory given  Plan Discussed with: CRNA  Anesthesia Plan Comments:         Anesthesia Quick Evaluation

## 2022-01-13 NOTE — Telephone Encounter (Signed)
Telephone call to check on pre-operative status.  Patient compliant with pre-operative instructions.  Reinforced nothing to eat after midnight. Clear liquids until 4:30am. Patient to arrive at 5:30am.  No questions or concerns voiced.  Instructed to call for any needs.

## 2022-01-14 ENCOUNTER — Encounter (HOSPITAL_COMMUNITY): Payer: Self-pay | Admitting: Gynecologic Oncology

## 2022-01-14 ENCOUNTER — Ambulatory Visit (HOSPITAL_COMMUNITY)
Admission: RE | Admit: 2022-01-14 | Discharge: 2022-01-14 | Disposition: A | Discharge status: Home / Self Care | Payer: Medicare HMO | Source: Ambulatory Visit | Attending: Gynecologic Oncology | Admitting: Gynecologic Oncology

## 2022-01-14 ENCOUNTER — Encounter (HOSPITAL_COMMUNITY)
Admission: RE | Disposition: A | Discharge status: Home / Self Care | Payer: Self-pay | Source: Ambulatory Visit | Attending: Gynecologic Oncology

## 2022-01-14 ENCOUNTER — Ambulatory Visit (HOSPITAL_COMMUNITY): Payer: Medicare HMO | Admitting: Certified Registered Nurse Anesthetist

## 2022-01-14 ENCOUNTER — Other Ambulatory Visit: Payer: Self-pay

## 2022-01-14 ENCOUNTER — Ambulatory Visit (HOSPITAL_BASED_OUTPATIENT_CLINIC_OR_DEPARTMENT_OTHER): Payer: Medicare HMO | Admitting: Certified Registered Nurse Anesthetist

## 2022-01-14 DIAGNOSIS — K560 Paralytic ileus: Secondary | ICD-10-CM | POA: Diagnosis present

## 2022-01-14 DIAGNOSIS — D3911 Neoplasm of uncertain behavior of right ovary: Secondary | ICD-10-CM | POA: Diagnosis not present

## 2022-01-14 DIAGNOSIS — K449 Diaphragmatic hernia without obstruction or gangrene: Secondary | ICD-10-CM | POA: Insufficient documentation

## 2022-01-14 DIAGNOSIS — N731 Chronic parametritis and pelvic cellulitis: Secondary | ICD-10-CM | POA: Diagnosis not present

## 2022-01-14 DIAGNOSIS — I10 Essential (primary) hypertension: Secondary | ICD-10-CM

## 2022-01-14 DIAGNOSIS — F17210 Nicotine dependence, cigarettes, uncomplicated: Secondary | ICD-10-CM

## 2022-01-14 DIAGNOSIS — K660 Peritoneal adhesions (postprocedural) (postinfection): Secondary | ICD-10-CM

## 2022-01-14 DIAGNOSIS — M1990 Unspecified osteoarthritis, unspecified site: Secondary | ICD-10-CM | POA: Insufficient documentation

## 2022-01-14 DIAGNOSIS — D069 Carcinoma in situ of cervix, unspecified: Secondary | ICD-10-CM | POA: Insufficient documentation

## 2022-01-14 DIAGNOSIS — F418 Other specified anxiety disorders: Secondary | ICD-10-CM | POA: Insufficient documentation

## 2022-01-14 DIAGNOSIS — N838 Other noninflammatory disorders of ovary, fallopian tube and broad ligament: Secondary | ICD-10-CM

## 2022-01-14 DIAGNOSIS — N840 Polyp of corpus uteri: Secondary | ICD-10-CM | POA: Diagnosis not present

## 2022-01-14 DIAGNOSIS — D251 Intramural leiomyoma of uterus: Secondary | ICD-10-CM | POA: Insufficient documentation

## 2022-01-14 DIAGNOSIS — N879 Dysplasia of cervix uteri, unspecified: Secondary | ICD-10-CM | POA: Diagnosis not present

## 2022-01-14 DIAGNOSIS — K219 Gastro-esophageal reflux disease without esophagitis: Secondary | ICD-10-CM | POA: Insufficient documentation

## 2022-01-14 DIAGNOSIS — D4959 Neoplasm of unspecified behavior of other genitourinary organ: Secondary | ICD-10-CM | POA: Diagnosis not present

## 2022-01-14 DIAGNOSIS — R87613 High grade squamous intraepithelial lesion on cytologic smear of cervix (HGSIL): Secondary | ICD-10-CM

## 2022-01-14 DIAGNOSIS — N72 Inflammatory disease of cervix uteri: Secondary | ICD-10-CM | POA: Insufficient documentation

## 2022-01-14 DIAGNOSIS — N83202 Unspecified ovarian cyst, left side: Secondary | ICD-10-CM | POA: Insufficient documentation

## 2022-01-14 DIAGNOSIS — N736 Female pelvic peritoneal adhesions (postinfective): Secondary | ICD-10-CM

## 2022-01-14 DIAGNOSIS — N9489 Other specified conditions associated with female genital organs and menstrual cycle: Secondary | ICD-10-CM | POA: Diagnosis not present

## 2022-01-14 HISTORY — PX: ROBOTIC ASSISTED TOTAL HYSTERECTOMY: SHX6085

## 2022-01-14 HISTORY — PX: ROBOTIC ASSISTED BILATERAL SALPINGO OOPHERECTOMY: SHX6078

## 2022-01-14 HISTORY — PX: DILATATION & CURETTAGE/HYSTEROSCOPY WITH MYOSURE: SHX6511

## 2022-01-14 SURGERY — SALPINGO-OOPHORECTOMY, BILATERAL, ROBOT-ASSISTED
Anesthesia: General

## 2022-01-14 MED ORDER — FENTANYL CITRATE PF 50 MCG/ML IJ SOSY
25.0000 ug | PREFILLED_SYRINGE | INTRAMUSCULAR | Status: DC | PRN
  Administered 2022-01-14 (×3): 50 ug via INTRAVENOUS

## 2022-01-14 MED ORDER — LACTATED RINGERS IV SOLN: INTRAVENOUS | Status: DC | PRN

## 2022-01-14 MED ORDER — FENTANYL CITRATE (PF) 100 MCG/2ML IJ SOLN
INTRAMUSCULAR | Status: AC
  Filled 2022-01-14: qty 2

## 2022-01-14 MED ORDER — OXYCODONE HCL 5 MG PO TABS
5.0000 mg | ORAL_TABLET | ORAL | Status: DC | PRN
  Administered 2022-01-14: 5 mg via ORAL

## 2022-01-14 MED ORDER — DEXMEDETOMIDINE (PRECEDEX) IN NS 20 MCG/5ML (4 MCG/ML) IV SYRINGE
PREFILLED_SYRINGE | INTRAVENOUS | Status: DC | PRN
  Administered 2022-01-14: 8 ug via INTRAVENOUS

## 2022-01-14 MED ORDER — EPHEDRINE SULFATE-NACL 50-0.9 MG/10ML-% IV SOSY
PREFILLED_SYRINGE | INTRAVENOUS | Status: DC | PRN
  Administered 2022-01-14: 7.5 mg via INTRAVENOUS

## 2022-01-14 MED ORDER — STERILE WATER FOR INJECTION IJ SOLN
INTRAMUSCULAR | Status: AC
  Filled 2022-01-14: qty 10

## 2022-01-14 MED ORDER — KETAMINE HCL-SODIUM CHLORIDE 100-0.9 MG/10ML-% IV SOSY
PREFILLED_SYRINGE | INTRAVENOUS | Status: DC | PRN
  Administered 2022-01-14: 30 mg via INTRAVENOUS
  Administered 2022-01-14: 10 mg via INTRAVENOUS

## 2022-01-14 MED ORDER — FENTANYL CITRATE (PF) 100 MCG/2ML IJ SOLN
INTRAMUSCULAR | Status: DC | PRN
  Administered 2022-01-14: 50 ug via INTRAVENOUS
  Administered 2022-01-14: 25 ug via INTRAVENOUS
  Administered 2022-01-14 (×6): 50 ug via INTRAVENOUS

## 2022-01-14 MED ORDER — LIDOCAINE 2% (20 MG/ML) 5 ML SYRINGE
INTRAMUSCULAR | Status: DC | PRN
  Administered 2022-01-14: 60 mg via INTRAVENOUS

## 2022-01-14 MED ORDER — CHLORHEXIDINE GLUCONATE 0.12 % MT SOLN
15.0000 mL | Freq: Once | OROMUCOSAL | Status: AC
  Administered 2022-01-14: 15 mL via OROMUCOSAL

## 2022-01-14 MED ORDER — LIDOCAINE HCL (PF) 2 % IJ SOLN
INTRAMUSCULAR | Status: DC | PRN
  Administered 2022-01-14: 1.5 mg/kg/h via INTRADERMAL

## 2022-01-14 MED ORDER — MIDAZOLAM HCL 5 MG/5ML IJ SOLN
INTRAMUSCULAR | Status: DC | PRN
  Administered 2022-01-14: 2 mg via INTRAVENOUS

## 2022-01-14 MED ORDER — BUPIVACAINE HCL 0.25 % IJ SOLN
INTRAMUSCULAR | Status: DC | PRN
  Administered 2022-01-14: 30 mL

## 2022-01-14 MED ORDER — FENTANYL CITRATE PF 50 MCG/ML IJ SOSY
PREFILLED_SYRINGE | INTRAMUSCULAR | Status: AC
  Filled 2022-01-14: qty 1

## 2022-01-14 MED ORDER — ONDANSETRON HCL 4 MG/2ML IJ SOLN
INTRAMUSCULAR | Status: AC
  Filled 2022-01-14: qty 2

## 2022-01-14 MED ORDER — LIDOCAINE HCL 2 % IJ SOLN
INTRAMUSCULAR | Status: AC
  Filled 2022-01-14: qty 20

## 2022-01-14 MED ORDER — TRAMADOL HCL 50 MG PO TABS: 50.0000 mg | ORAL_TABLET | Freq: Four times a day (QID) | ORAL | Status: DC | PRN

## 2022-01-14 MED ORDER — ORAL CARE MOUTH RINSE: 15.0000 mL | Freq: Once | OROMUCOSAL | Status: AC

## 2022-01-14 MED ORDER — STERILE WATER FOR IRRIGATION IR SOLN
Status: DC | PRN
  Administered 2022-01-14: 1000 mL

## 2022-01-14 MED ORDER — DEXAMETHASONE SODIUM PHOSPHATE 4 MG/ML IJ SOLN: 4.0000 mg | INTRAMUSCULAR | Status: DC

## 2022-01-14 MED ORDER — SUGAMMADEX SODIUM 500 MG/5ML IV SOLN
INTRAVENOUS | Status: DC | PRN
  Administered 2022-01-14: 320 mg via INTRAVENOUS

## 2022-01-14 MED ORDER — LACTATED RINGERS IV SOLN: INTRAVENOUS | Status: DC

## 2022-01-14 MED ORDER — KETOROLAC TROMETHAMINE 15 MG/ML IJ SOLN
15.0000 mg | Freq: Once | INTRAMUSCULAR | Status: AC | PRN
  Administered 2022-01-14: 15 mg via INTRAVENOUS

## 2022-01-14 MED ORDER — ROCURONIUM BROMIDE 10 MG/ML (PF) SYRINGE
PREFILLED_SYRINGE | INTRAVENOUS | Status: DC | PRN
  Administered 2022-01-14: 20 mg via INTRAVENOUS
  Administered 2022-01-14: 60 mg via INTRAVENOUS
  Administered 2022-01-14 (×2): 20 mg via INTRAVENOUS

## 2022-01-14 MED ORDER — PHENYLEPHRINE HCL-NACL 20-0.9 MG/250ML-% IV SOLN
INTRAVENOUS | Status: AC
  Filled 2022-01-14: qty 750

## 2022-01-14 MED ORDER — ONDANSETRON HCL 4 MG/2ML IJ SOLN: 4.0000 mg | Freq: Once | INTRAMUSCULAR | Status: DC | PRN

## 2022-01-14 MED ORDER — PROPOFOL 10 MG/ML IV BOLUS
INTRAVENOUS | Status: AC
  Filled 2022-01-14: qty 20

## 2022-01-14 MED ORDER — LACTATED RINGERS IR SOLN
Status: DC | PRN
  Administered 2022-01-14: 1000 mL

## 2022-01-14 MED ORDER — ONDANSETRON HCL 4 MG/2ML IJ SOLN
INTRAMUSCULAR | Status: DC | PRN
  Administered 2022-01-14: 4 mg via INTRAVENOUS

## 2022-01-14 MED ORDER — FENTANYL CITRATE PF 50 MCG/ML IJ SOSY
PREFILLED_SYRINGE | INTRAMUSCULAR | Status: AC
  Filled 2022-01-14: qty 2

## 2022-01-14 MED ORDER — CEFAZOLIN SODIUM-DEXTROSE 2-3 GM-%(50ML) IV SOLR
INTRAVENOUS | Status: DC | PRN
  Administered 2022-01-14: 2 g via INTRAVENOUS

## 2022-01-14 MED ORDER — HEPARIN SODIUM (PORCINE) 5000 UNIT/ML IJ SOLN
5000.0000 [IU] | INTRAMUSCULAR | Status: AC
  Administered 2022-01-14: 5000 [IU] via SUBCUTANEOUS
  Filled 2022-01-14: qty 1

## 2022-01-14 MED ORDER — DEXAMETHASONE SODIUM PHOSPHATE 10 MG/ML IJ SOLN
INTRAMUSCULAR | Status: AC
  Filled 2022-01-14: qty 1

## 2022-01-14 MED ORDER — HYDRALAZINE HCL 20 MG/ML IJ SOLN
INTRAMUSCULAR | Status: DC | PRN
  Administered 2022-01-14: 2 mg via INTRAVENOUS
  Administered 2022-01-14: 4 mg via INTRAVENOUS
  Administered 2022-01-14 (×3): 2 mg via INTRAVENOUS

## 2022-01-14 MED ORDER — KETOROLAC TROMETHAMINE 15 MG/ML IJ SOLN
INTRAMUSCULAR | Status: AC
  Filled 2022-01-14: qty 1

## 2022-01-14 MED ORDER — HYDRALAZINE HCL 20 MG/ML IJ SOLN
INTRAMUSCULAR | Status: AC
  Filled 2022-01-14: qty 1

## 2022-01-14 MED ORDER — LIDOCAINE HCL (PF) 2 % IJ SOLN
INTRAMUSCULAR | Status: AC
  Filled 2022-01-14: qty 15

## 2022-01-14 MED ORDER — KETAMINE HCL 50 MG/5ML IJ SOSY
PREFILLED_SYRINGE | INTRAMUSCULAR | Status: AC
  Filled 2022-01-14: qty 5

## 2022-01-14 MED ORDER — KETOROLAC TROMETHAMINE 30 MG/ML IJ SOLN
INTRAMUSCULAR | Status: AC
  Filled 2022-01-14: qty 1

## 2022-01-14 MED ORDER — MIDAZOLAM HCL 2 MG/2ML IJ SOLN
INTRAMUSCULAR | Status: AC
  Filled 2022-01-14: qty 2

## 2022-01-14 MED ORDER — AMISULPRIDE (ANTIEMETIC) 5 MG/2ML IV SOLN: 10.0000 mg | Freq: Once | INTRAVENOUS | Status: DC | PRN

## 2022-01-14 MED ORDER — CEFAZOLIN SODIUM 1 G IJ SOLR
INTRAMUSCULAR | Status: AC
Start: 2022-01-14 — End: ?
  Filled 2022-01-14: qty 20

## 2022-01-14 MED ORDER — ROCURONIUM BROMIDE 10 MG/ML (PF) SYRINGE
PREFILLED_SYRINGE | INTRAVENOUS | Status: AC
Start: 2022-01-14 — End: ?
  Filled 2022-01-14: qty 10

## 2022-01-14 MED ORDER — ROCURONIUM BROMIDE 10 MG/ML (PF) SYRINGE
PREFILLED_SYRINGE | INTRAVENOUS | Status: AC
  Filled 2022-01-14: qty 10

## 2022-01-14 MED ORDER — LIDOCAINE HCL (PF) 2 % IJ SOLN
INTRAMUSCULAR | Status: AC
  Filled 2022-01-14: qty 5

## 2022-01-14 MED ORDER — ACETAMINOPHEN 500 MG PO TABS
1000.0000 mg | ORAL_TABLET | ORAL | Status: AC
  Administered 2022-01-14: 1000 mg via ORAL
  Filled 2022-01-14: qty 2

## 2022-01-14 MED ORDER — PROPOFOL 10 MG/ML IV BOLUS
INTRAVENOUS | Status: DC | PRN
  Administered 2022-01-14: 150 mg via INTRAVENOUS
  Administered 2022-01-14 (×2): 50 mg via INTRAVENOUS

## 2022-01-14 MED ORDER — OXYCODONE HCL 5 MG PO TABS: 5.0000 mg | ORAL_TABLET | ORAL | 0 refills | Status: DC | PRN

## 2022-01-14 MED ORDER — BUPIVACAINE HCL 0.25 % IJ SOLN
INTRAMUSCULAR | Status: AC
  Filled 2022-01-14: qty 1

## 2022-01-14 MED ORDER — LIDOCAINE HCL (PF) 1 % IJ SOLN
INTRAMUSCULAR | Status: AC
  Filled 2022-01-14: qty 30

## 2022-01-14 MED ORDER — OXYCODONE HCL 5 MG PO TABS
ORAL_TABLET | ORAL | Status: AC
  Filled 2022-01-14: qty 1

## 2022-01-14 MED ORDER — DEXAMETHASONE SODIUM PHOSPHATE 10 MG/ML IJ SOLN
INTRAMUSCULAR | Status: DC | PRN
  Administered 2022-01-14: 10 mg via INTRAVENOUS

## 2022-01-14 SURGICAL SUPPLY — 95 items
ADH SKN CLS APL DERMABOND .7 (GAUZE/BANDAGES/DRESSINGS) ×2
AGENT HMST KT MTR STRL THRMB (HEMOSTASIS)
APL ESCP 34 STRL LF DISP (HEMOSTASIS)
APPLICATOR SURGIFLO ENDO (HEMOSTASIS) IMPLANT
BAG COUNTER SPONGE SURGICOUNT (BAG) ×2 IMPLANT
BAG LAPAROSCOPIC 12 15 PORT 16 (BASKET) IMPLANT
BAG RETRIEVAL 12/15 (BASKET) ×4
BAG SPNG CNTER NS LX DISP (BAG) ×2
BINDER ABDOMINAL 12 ML 46-62 (SOFTGOODS) IMPLANT
BIPOLAR CUTTING LOOP 21FR (ELECTRODE)
BLADE SURG SZ10 CARB STEEL (BLADE) IMPLANT
COVER BACK TABLE 60X90IN (DRAPES) ×2 IMPLANT
COVER TIP SHEARS 8 DVNC (MISCELLANEOUS) ×2 IMPLANT
COVER TIP SHEARS 8MM DA VINCI (MISCELLANEOUS) ×2
DERMABOND ADVANCED (GAUZE/BANDAGES/DRESSINGS) ×2
DERMABOND ADVANCED .7 DNX12 (GAUZE/BANDAGES/DRESSINGS) ×3 IMPLANT
DEVICE MYOSURE LITE (MISCELLANEOUS) IMPLANT
DEVICE MYOSURE REACH (MISCELLANEOUS) IMPLANT
DILATOR CANAL MILEX (MISCELLANEOUS) IMPLANT
DRAPE ARM DVNC X/XI (DISPOSABLE) ×8 IMPLANT
DRAPE COLUMN DVNC XI (DISPOSABLE) ×3 IMPLANT
DRAPE DA VINCI XI ARM (DISPOSABLE) ×8
DRAPE DA VINCI XI COLUMN (DISPOSABLE) ×2
DRAPE SHEET LG 3/4 BI-LAMINATE (DRAPES) ×2 IMPLANT
DRAPE SURG IRRIG POUCH 19X23 (DRAPES) ×2 IMPLANT
DRSG OPSITE POSTOP 4X6 (GAUZE/BANDAGES/DRESSINGS) IMPLANT
DRSG OPSITE POSTOP 4X8 (GAUZE/BANDAGES/DRESSINGS) IMPLANT
ELECT PENCIL ROCKER SW 15FT (MISCELLANEOUS) IMPLANT
ELECT REM PT RETURN 15FT ADLT (MISCELLANEOUS) ×2 IMPLANT
GAUZE 4X4 16PLY ~~LOC~~+RFID DBL (SPONGE) ×4 IMPLANT
GLOVE BIO SURGEON STRL SZ 6 (GLOVE) ×8 IMPLANT
GLOVE BIO SURGEON STRL SZ 6.5 (GLOVE) ×2 IMPLANT
GOWN STRL REUS W/ TWL LRG LVL3 (GOWN DISPOSABLE) ×12 IMPLANT
GOWN STRL REUS W/TWL LRG LVL3 (GOWN DISPOSABLE) ×8
GRASPER SUT TROCAR 14GX15 (MISCELLANEOUS) IMPLANT
HOLDER FOLEY CATH W/STRAP (MISCELLANEOUS) IMPLANT
IRRIG SUCT STRYKERFLOW 2 WTIP (MISCELLANEOUS) ×2
IRRIGATION SUCT STRKRFLW 2 WTP (MISCELLANEOUS) ×3 IMPLANT
IV NS IRRIG 3000ML ARTHROMATIC (IV SOLUTION) ×2 IMPLANT
KIT PROCEDURE DA VINCI SI (MISCELLANEOUS)
KIT PROCEDURE DVNC SI (MISCELLANEOUS) IMPLANT
KIT PROCEDURE FLUENT (KITS) IMPLANT
KIT TURNOVER KIT A (KITS) IMPLANT
LIGASURE IMPACT 36 18CM CVD LR (INSTRUMENTS) IMPLANT
LOOP CUTTING BIPOLAR 21FR (ELECTRODE) IMPLANT
MANIPULATOR ADVINCU DEL 3.0 PL (MISCELLANEOUS) IMPLANT
MANIPULATOR ADVINCU DEL 3.5 PL (MISCELLANEOUS) IMPLANT
MANIPULATOR UTERINE 4.5 ZUMI (MISCELLANEOUS) IMPLANT
MYOSURE XL FIBROID (MISCELLANEOUS)
NDL HYPO 21X1.5 SAFETY (NEEDLE) ×2 IMPLANT
NDL SPNL 18GX3.5 QUINCKE PK (NEEDLE) IMPLANT
NEEDLE HYPO 21X1.5 SAFETY (NEEDLE) ×2 IMPLANT
NEEDLE SPNL 18GX3.5 QUINCKE PK (NEEDLE) IMPLANT
OBTURATOR OPTICAL STANDARD 8MM (TROCAR) ×2
OBTURATOR OPTICAL STND 8 DVNC (TROCAR) ×2
OBTURATOR OPTICALSTD 8 DVNC (TROCAR) ×2 IMPLANT
PACK ROBOT GYN CUSTOM WL (TRAY / TRAY PROCEDURE) ×2 IMPLANT
PACK VAGINAL WOMENS (CUSTOM PROCEDURE TRAY) ×2 IMPLANT
PAD OB MATERNITY 4.3X12.25 (PERSONAL CARE ITEMS) IMPLANT
PAD POSITIONING PINK XL (MISCELLANEOUS) ×2 IMPLANT
PORT ACCESS TROCAR AIRSEAL 12 (TROCAR) ×2 IMPLANT
PORT ACCESS TROCAR AIRSEAL 5M (TROCAR) ×2
SCRUB CHG 4% DYNA-HEX 4OZ (MISCELLANEOUS) ×4 IMPLANT
SEAL CANN UNIV 5-8 DVNC XI (MISCELLANEOUS) ×8 IMPLANT
SEAL ROD LENS SCOPE MYOSURE (ABLATOR) IMPLANT
SEAL XI 5MM-8MM UNIVERSAL (MISCELLANEOUS) ×8
SEALER VESSEL DA VINCI XI (MISCELLANEOUS) ×2
SEALER VESSEL EXT DVNC XI (MISCELLANEOUS) IMPLANT
SET TRI-LUMEN FLTR TB AIRSEAL (TUBING) ×2 IMPLANT
SPIKE FLUID TRANSFER (MISCELLANEOUS) ×2 IMPLANT
SPONGE T-LAP 18X18 ~~LOC~~+RFID (SPONGE) IMPLANT
SURGIFLO W/THROMBIN 8M KIT (HEMOSTASIS) IMPLANT
SUT MNCRL AB 4-0 PS2 18 (SUTURE) IMPLANT
SUT PDS AB 1 TP1 96 (SUTURE) IMPLANT
SUT VIC AB 0 CT1 27 (SUTURE)
SUT VIC AB 0 CT1 27XBRD ANTBC (SUTURE) IMPLANT
SUT VIC AB 2-0 CT1 27 (SUTURE)
SUT VIC AB 2-0 CT1 TAPERPNT 27 (SUTURE) IMPLANT
SUT VIC AB 2-0 SH 27 (SUTURE) ×4
SUT VIC AB 2-0 SH 27X BRD (SUTURE) IMPLANT
SUT VIC AB 4-0 PS2 18 (SUTURE) ×4 IMPLANT
SYR 10ML LL (SYRINGE) IMPLANT
SYS BAG RETRIEVAL 10MM (BASKET)
SYS WOUND ALEXIS 18CM MED (MISCELLANEOUS)
SYSTEM BAG RETRIEVAL 10MM (BASKET) IMPLANT
SYSTEM TISS REMOVAL MYOSURE XL (MISCELLANEOUS) IMPLANT
SYSTEM WOUND ALEXIS 18CM MED (MISCELLANEOUS) IMPLANT
TOWEL OR 17X26 10 PK STRL BLUE (TOWEL DISPOSABLE) ×2 IMPLANT
TOWEL OR NON WOVEN STRL DISP B (DISPOSABLE) IMPLANT
TRAP SPECIMEN MUCUS 40CC (MISCELLANEOUS) IMPLANT
TRAY FOLEY MTR SLVR 16FR STAT (SET/KITS/TRAYS/PACK) ×2 IMPLANT
UNDERPAD 30X36 HEAVY ABSORB (UNDERPADS AND DIAPERS) ×4 IMPLANT
WATER STERILE IRR 1000ML POUR (IV SOLUTION) ×2 IMPLANT
WATER STERILE IRR 500ML POUR (IV SOLUTION) ×2 IMPLANT
YANKAUER SUCT BULB TIP 10FT TU (MISCELLANEOUS) IMPLANT

## 2022-01-14 NOTE — Discharge Instructions (Addendum)
AFTER SURGERY INSTRUCTIONS   Return to work: 4-6 weeks if applicable   Activity: 1. Be up and out of the bed during the day.  Take a nap if needed.  You may walk up steps but be careful and use the hand rail.  Stair climbing will tire you more than you think, you may need to stop part way and rest.    2. No lifting or straining for 6 weeks over 10 pounds. No pushing, pulling, straining for 6 weeks.   3. No driving for around 1 week(s).  Do not drive if you are taking narcotic pain medicine and make sure that your reaction time has returned.    4. You can shower as soon as the next day after surgery. Shower daily.  Use your regular soap and water (not directly on the incision) and pat your incision(s) dry afterwards; don't rub.  No tub baths or submerging your body in water until cleared by your surgeon. If you have the soap that was given to you by pre-surgical testing that was used before surgery, you do not need to use it afterwards because this can irritate your incisions.    5. No sexual activity and nothing in the vagina for 10 weeks since you had a hysterectomy.   6. You may experience a small amount of clear drainage from your incisions, which is normal.  If the drainage persists, increases, or changes color please call the office.   7. Do not use creams, lotions, or ointments such as neosporin on your incisions after surgery until advised by your surgeon because they can cause removal of the dermabond glue on your incisions.     8. You may experience vaginal spotting after surgery or around the 6-8 week mark from surgery when the stitches at the top of the vagina begin to dissolve since you had a hysterectomy.  The spotting is normal but if you experience heavy bleeding, call our office.   9. Take Tylenol or ibuprofen first for pain if you are able to take these medications and only use narcotic pain medication for severe pain not relieved by the Tylenol or Ibuprofen.  Monitor your  Tylenol intake to a max of 4,000 mg in a 24 hour period. You can alternate these medications after surgery.   Diet: 1. Low sodium Heart Healthy Diet is recommended but you are cleared to resume your normal (before surgery) diet after your procedure.   2. It is safe to use a laxative, such as Miralax or Colace, if you have difficulty moving your bowels. You have been prescribed Sennakot-S to take at bedtime every evening after surgery to keep bowel movements regular and to prevent constipation.     Wound Care: 1. Keep clean and dry.  Shower daily.   Reasons to call the Doctor: Fever - Oral temperature greater than 100.4 degrees Fahrenheit Foul-smelling vaginal discharge Difficulty urinating Nausea and vomiting Increased pain at the site of the incision that is unrelieved with pain medicine. Difficulty breathing with or without chest pain New calf pain especially if only on one side Sudden, continuing increased vaginal bleeding with or without clots.   Contacts: For questions or concerns you should contact:   Dr. Jeral Pinch at [phone]   Joylene John, NP at [phone]   After Hours: call [phone] and have the GYN Oncologist paged/contacted (after 5 pm or on the weekends).   Messages sent via mychart are for non-urgent matters and are not responded to after hours  so for urgent needs, please call the after hours number.

## 2022-01-14 NOTE — Anesthesia Procedure Notes (Signed)
Procedure Name: Intubation Date/Time: 01/14/2022 7:41 AM  Performed by: West Pugh, CRNAPre-anesthesia Checklist: Patient identified, Emergency Drugs available, Suction available, Patient being monitored and Timeout performed Patient Re-evaluated:Patient Re-evaluated prior to induction Oxygen Delivery Method: Circle system utilized Preoxygenation: Pre-oxygenation with 100% oxygen Induction Type: IV induction Ventilation: Mask ventilation without difficulty and Oral airway inserted - appropriate to patient size Laryngoscope Size: 3 and Glidescope Grade View: Grade I Tube type: Oral Tube size: 7.0 mm Number of attempts: 2 Airway Equipment and Method: Rigid stylet and Video-laryngoscopy Placement Confirmation: ETT inserted through vocal cords under direct vision, positive ETCO2, CO2 detector and breath sounds checked- equal and bilateral Tube secured with: Tape Dental Injury: Teeth and Oropharynx as per pre-operative assessment  Comments: DL x 1 attempted by CRNA grade 3 unsuccessful attempt. Glidescope utilized and successfully intubated.

## 2022-01-14 NOTE — Anesthesia Postprocedure Evaluation (Signed)
Anesthesia Post Note  Patient: Theresa Reyes  Procedure(s) Performed: XI ROBOTIC ASSISTED BILATERAL SALPINGO OOPHORECTOMY (Bilateral) XI ROBOTIC ASSISTED TOTAL HYSTERECTOMY, STAGING, PERITONEAL BIOPSIES, OMENTECTOMY, LYSIS OF ADHESIONS, PELVIC WASHINGS DILATATION & CURETTAGE     Patient location during evaluation: PACU Anesthesia Type: General Level of consciousness: awake Pain management: pain level controlled Vital Signs Assessment: post-procedure vital signs reviewed and stable Respiratory status: spontaneous breathing, nonlabored ventilation, respiratory function stable and patient connected to nasal cannula oxygen Cardiovascular status: blood pressure returned to baseline and stable Postop Assessment: no apparent nausea or vomiting Anesthetic complications: no   No notable events documented.  Last Vitals:  Vitals:   01/14/22 1515 01/14/22 1600  BP: (!) 147/80   Pulse: 71   Resp:    Temp:    SpO2: 97% 92%    Last Pain:  Vitals:   01/14/22 1600  TempSrc:   PainSc: 0-No pain                 Dannie Woolen P Kaedence Connelly

## 2022-01-14 NOTE — Interval H&P Note (Signed)
History and Physical Interval Note:  01/14/2022 6:30 AM  Theresa Reyes  has presented today for surgery, with the diagnosis of ADNEXAL MASS, ENDOMETRIAL POLYP.  The various methods of treatment have been discussed with the patient and family. After consideration of risks, benefits and other options for treatment, the patient has consented to  Procedure(s): XI ROBOTIC ASSISTED BILATERAL SALPINGO OOPHORECTOMY (Bilateral) POSSIBLE XI ROBOTIC ASSISTED TOTAL HYSTERECTOMY, POSSIBLE STAGING, POSSIBLE LAPAROTOMY (N/A) DILATATION & CURETTAGE/HYSTEROSCOPY WITH POSSIBLE MYOSURE (N/A) as a surgical intervention.  The patient's history has been reviewed, patient examined, no change in status, stable for surgery.  I have reviewed the patient's chart and labs.  Questions were answered to the patient's satisfaction.     Lafonda Mosses

## 2022-01-14 NOTE — Transfer of Care (Signed)
Immediate Anesthesia Transfer of Care Note  Patient: Theresa Reyes  Procedure(s) Performed: XI ROBOTIC ASSISTED BILATERAL SALPINGO OOPHORECTOMY (Bilateral) XI ROBOTIC ASSISTED TOTAL HYSTERECTOMY, STAGING, PERITONEAL BIOPSIES, OMENTECTOMY, LYSIS OF ADHESIONS, PELVIC WASHINGS DILATATION & CURETTAGE  Patient Location: PACU  Anesthesia Type:General  Level of Consciousness: drowsy and patient cooperative  Airway & Oxygen Therapy: Patient Spontanous Breathing and Patient connected to face mask oxygen  Post-op Assessment: Report given to RN and Post -op Vital signs reviewed and stable  Post vital signs: Reviewed and stable  Last Vitals:  Vitals Value Taken Time  BP    Temp    Pulse    Resp    SpO2      Last Pain:  Vitals:   01/14/22 0629  TempSrc:   PainSc: 0-No pain         Complications: No notable events documented.

## 2022-01-14 NOTE — Progress Notes (Signed)
GYN Oncology Progress Note  Patient alert, oriented, in no acute distress, sitting in chair in PACU. No nausea or emesis reported. Voiding without difficulty. Reporting majority of abdominal discomfort in the left upper abd quadrant. Requesting abdominal binder and oxycodone for home use instead of tramadol. Advised Dr. Berline Lopes would be over to see her shortly to decide if overnight stay is warranted. No concerns voiced.

## 2022-01-14 NOTE — Op Note (Signed)
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  On intra-abdominal entry, normal appearing left diaphragm, liver edge, and stomach.  Omentum with adhesions to the anterior abdominal wall at the level of the falciform ligament and right upper quadrant assistant with prior surgery.  Omentum and several loops of small bowel adherent to the anterior abdominal wall just below the umbilicus.  Adhesions of the sigmoid epiploica and sigmoid mesentery to the left pelvic sidewall and posterior/fundal uterus.  Right ovary replaced by 12 cm smooth and cystic appearing mass.  No distal fallopian tube apparent.  Left tube and ovary adherent to the sigmoid mesentery and medial aspect of the broad ligament, otherwise normal-appearing.  Uterus approximately 6 cm and normal-appearing.  Some adhesions between the bladder and lower uterine segment/cervix.  No ascites.  No intra-abdominal evidence of metastatic disease. 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  No invasion noted. Given pelvic adhesive disease, decision made to proceed with total hysterectomy. Several areas of very minor serosal injury to loops of small bowel adherent to the abdominal wall after lysis of adhesion were oversewn with interrupted suture using 2-0 Vicryl.  Estimated Blood Loss:  125cc      Total IV Fluids: see I&O flowsheet         Specimens: uterus, cervix, bilateral tubes  and ovaries, peritoneal biopsies, omentum        Complications:  None apparent; patient tolerated the procedure well.         Disposition: PACU - hemodynamically stable.  Procedure Details  The patient was seen in the Holding Room. The risks, benefits, complications, treatment options, and expected outcomes were discussed with the patient.  The patient concurred with the proposed plan, giving informed consent.  The site of surgery properly noted/marked. The patient was identified as Theresa Reyes and the procedure verified as a Robotic-assisted hysterectomy with bilateral salpingo oophorectomy with SLN biopsy.   After induction of anesthesia, the patient was draped and prepped in the usual sterile manner. Patient was placed in supine position after anesthesia and draped and prepped in the usual sterile manner as follows: Her arms were tucked to her side with all appropriate precautions.  The was secured to the bed using padding and tape.  The patient was placed in the semi-lithotomy position in [place].  The perineum and vagina were prepped with CholoraPrep. The patient was draped after the CholoraPrep had been allowed to dry for 3 minutes.  A Time Out was held and the above information confirmed.  The urethra was prepped with Betadine. Foley catheter was placed.  A sterile speculum was placed in the vagina.  The cervix was grasped with a single-tooth tenaculum. The cervix was dilated with Kennon Rounds dilators.  The delineator 3.5 uterine manipulator with a colpotomizer ring was placed without difficulty.  A pneum occluder balloon was placed over the manipulator.  OG tube placement was confirmed and to suction.   Next, a 10 mm skin incision was made 1 cm below the subcostal margin in the midclavicular line.  The 5 mm Optiview  port and scope was used for direct entry.  Opening pressure was under 10 mm CO2.  The abdomen was insufflated and the findings were noted as above.   At this point and all points  during the procedure, the patient's intra-abdominal pressure did not exceed 15 mmHg. Next, an 8 mm skin incision was made superior to the umbilicus and a right and left port were placed about 8 cm lateral to the robot port on the right and left side.  The 5 mm assist trocar was exchanged for a 10-12 mm port. All ports were placed under direct visualization.  The patient was placed in steep Trendelenburg.  The robot was docked in the normal manner.  Attention was first turned to the anterior abdominal wall.  Combination of sharp dissection and short burst of electrocautery were used to lyse adhesions of the omentum to the anterior peritoneum.  Sharp dissection was used to lyse adhesions of the loops of small bowel to the anterior abdominal wall.  Once freed, attention was turned to the pelvis.  Adhesions of the sigmoid to the left pelvic sidewall and left deep pelvis were lysed.  Adhesions between the sigmoid mesentery and the fundal/posterior uterus were also lysed, ultimately freeing the uterus as well as the left adnexa.  The right and left peritoneum were opened parallel to the IP ligament to open the retroperitoneal spaces bilaterally. The round ligaments were transected. The ureter was noted to be on the medial leaf of the broad ligament.  The peritoneum above the ureter was incised and stretched and the infundibulopelvic ligament was skeletonized, cauterized and cut.  On the right, the utero-ovarian ligament was isolated, cauterized, and transected, freeing the right ovary.  This was placed in a 15 mm Endo Catch bag.  On the left, the left tube and ovary were left attached to the uterine specimen.  The posterior peritoneum was taken down to the level of the KOH ring.  The anterior peritoneum was also taken down.  The bladder flap was created to the level of the KOH ring.  It was somewhat difficult to define the bladder plane given adhesions between the lower uterine segment and bladder.  Ultimately, the  uterine artery on the right side was skeletonized, cauterized and cut in the normal manner.  A similar procedure was performed on the left.  The colpotomy was made and the uterus, cervix, left tube and ovary were amputated and delivered through the vagina.  Pedicles were inspected and excellent hemostasis was achieved.  The Endo Catch bag containing the right ovary was delivered through the vagina.  Contained cyst drainage was performed and the specimen was removed and sent for frozen section.  The colpotomy at the vaginal cuff was closed with Vicryl on a CT1 needle in running manner.  Irrigation was used and excellent hemostasis was achieved.    Vulva was examined where it had been adherent to the anterior abdominal wall.  Several areas of what looks like possible very minor serosal injury were noted and oversewn with interrupted suture using 2-0 Vicryl.  This point frozen section returned showing likely borderline tumor.  Peritoneal biopsies were taken using monopolar electrocautery.  Patient was then flattened and an infracolic omentectomy was performed using monopolar cautery and the vessel sealer.  Omentum was then placed in an Endo Catch bag.  At this point in the procedure was completed.  Robotic instruments were removed under direct visulaization.  The robot was undocked.  The omentum was removed in piecemeal  fashion from the Endo Catch bag through the assist trocar.  This was sent for permanent.  The fascia at the 10-12 mm port was closed with 0 Vicryl using a PMI fascial closure device.  Abdomen was desufflated.  The subcuticular tissue was closed with 4-0 Vicryl and the skin was closed with 4-0 Monocryl in a subcuticular manner.  Dermabond was applied.    The vagina was swabbed with  minimal bleeding noted.  Foley catheter was removed.  All sponge, lap and needle counts were correct x  3.   The patient was transferred to the recovery room in stable condition.  Jeral Pinch, MD

## 2022-01-15 ENCOUNTER — Telehealth: Payer: Self-pay

## 2022-01-15 ENCOUNTER — Encounter (HOSPITAL_COMMUNITY): Payer: Self-pay | Admitting: Gynecologic Oncology

## 2022-01-15 NOTE — Telephone Encounter (Signed)
Spoke with Ms. Theresa Reyes. She states she is eating, drinking and urinating well. She has not had a BM and not passing gas. She is taking senokot as prescribed. Told her to add Miralax 1 capful now and take Senakot-S tonight as well. Encouraged her to drink 64 oz of caffeine free water daily. She denies fever or chills. Incisions are dry and intact. She rates her pain 3/10.  She used the Oxycodone last night and a tramadol today. Oxycodone more effective with the pain management. Suggested that she use Tylenol 1000 mg q 6 hours and use a 200 mg ibuprofen with food twice a day to help with the pain and not need the Oxycodone as frequently.  She is using the abdominal binder. Told her that Joylene John, NP said that people take it off for the night but if she feels better with it on in bed that she can do so. Pt verbalized understanding.  Instructed to call office with any fever, chills, purulent drainage, uncontrolled pain, nausea and or vomiting or any other questions or concerns. Patient verbalizes understanding.   Pt aware of post op appointments as well as the office number [phone] and after hours number [phone] to call if she has any questions or concerns

## 2022-01-16 ENCOUNTER — Telehealth: Payer: Self-pay | Admitting: *Deleted

## 2022-01-16 NOTE — Telephone Encounter (Signed)
Spoke with pt this afternoon to check in and see how she was feeling. She stated that she is still experiencing pain. She went 7 hours between the doses of Tramadol. Informed pt that she can take Tylenol in between the Tramadol to keep the pain from getting out of control. She still has not been able to take the miralax because her son will pick it up for her when he gets off work. She did take Dulcolax until she can get the miralax.  Reviewed with pt to watch for symptoms of extreme abd pain, nausea, vomiting, fevers chills, and if she does not have a BM to call the office or the after hours number. She verbalized understanding.

## 2022-01-16 NOTE — Telephone Encounter (Signed)
Spoke with pt this morning who stated that her pain is a 3-4/10 with medications. Yesterday she did take Tramadol, ibuprofen, and Tylenol but this morning she has only taken the Tramadol. Informed pt she can take the Tylenol 1000 mg q 6 hrs to help with the pain and inflammation as well. She verbalized understanding. She is now passing gas but has not had a BM. She did not take Miralax yesterday. Informed her to take Miralax today and the [facility] as prescribed. She verbalized understanding. She denies extreme abdominal pain, nausea, vomiting, abd distention, trouble urinating, or fevers or chills. She did not have any other questions. Pt stated she has our number if she needs anything and knows the after hours number to call as well.

## 2022-01-19 ENCOUNTER — Telehealth: Payer: Self-pay

## 2022-01-19 ENCOUNTER — Other Ambulatory Visit: Payer: Self-pay | Admitting: Gynecologic Oncology

## 2022-01-19 DIAGNOSIS — G8918 Other acute postprocedural pain: Secondary | ICD-10-CM

## 2022-01-19 LAB — CYTOLOGY - NON PAP

## 2022-01-19 LAB — SURGICAL PATHOLOGY

## 2022-01-19 MED ORDER — TRAMADOL HCL 50 MG PO TABS: 50.0000 mg | ORAL_TABLET | Freq: Four times a day (QID) | ORAL | 0 refills | Status: DC | PRN

## 2022-01-19 NOTE — Telephone Encounter (Signed)
Spoke with Ms.Rickey this afternoon. She states she is eating, drinking and urinating well. She has had a BM and is passing gas. She is taking 2 Senokot at night and drinks plenty of water. She denies fever or chills. Incisions are dry and intact.She has been up moving as much as she can stating "things need to get done". She has no more Oxycodone and she rates her pain 6/10. Her pain is controlled with alternating Tylenol and Tramadol every 4 hours. She needs a refill on the Tramadol, (has 2 left).   Pt aware I will notify Dr.Tucker and Joylene John NP of the refill and will call her back.   Instructed to call office with any fever, chills, purulent drainage, uncontrolled pain or any other questions or concerns. Patient verbalizes understanding.   Pt aware of post op appointments as well as the office number [phone] and after hours number [phone] to call if she has any questions or concerns

## 2022-01-19 NOTE — Progress Notes (Signed)
See CMA note about post-op status.

## 2022-01-19 NOTE — Telephone Encounter (Signed)
Pt is aware of refill for tramadol being sent to her pharmacy. She was thankful for the call

## 2022-01-21 ENCOUNTER — Inpatient Hospital Stay (HOSPITAL_BASED_OUTPATIENT_CLINIC_OR_DEPARTMENT_OTHER): Payer: Medicare HMO | Admitting: Gynecologic Oncology

## 2022-01-21 ENCOUNTER — Encounter: Payer: Self-pay | Admitting: Gynecologic Oncology

## 2022-01-21 DIAGNOSIS — D3911 Neoplasm of uncertain behavior of right ovary: Secondary | ICD-10-CM

## 2022-01-21 DIAGNOSIS — Z90710 Acquired absence of both cervix and uterus: Secondary | ICD-10-CM

## 2022-01-21 DIAGNOSIS — N840 Polyp of corpus uteri: Secondary | ICD-10-CM

## 2022-01-21 DIAGNOSIS — Z90722 Acquired absence of ovaries, bilateral: Secondary | ICD-10-CM

## 2022-01-21 DIAGNOSIS — Z7189 Other specified counseling: Secondary | ICD-10-CM

## 2022-01-21 NOTE — Progress Notes (Signed)
Gynecologic Oncology Telehealth Note: Gyn-Onc  I connected with Theresa Reyes on 01/21/22 at  4:20 PM EDT by telephone and verified that I am speaking with the correct person using two identifiers.  I discussed the limitations, risks, security and privacy concerns of performing an evaluation and management service by telemedicine and the availability of in-person appointments. I also discussed with the patient that there may be a patient responsible charge related to this service. The patient expressed understanding and agreed to proceed.  Other persons participating in the visit and their role in the encounter: none.  Patient's location: home Provider's location: [facility]  Reason for Visit: follow-up after surgery  Treatment History: Pelvic ultrasound on 10/29/21 showed a Uterus 5.7 x 2.8 x 3.6 cm, endometrial lining 9.4 mm. 10.1 x 9.5 x 9.3 cm right adnexa with 9.1 cm simple cyst. Left ovary normal in appearance.    Patient was seen by her OB/GYN on 11/03/2021.  At that time, vaginal swabs were collected which showed no chlamydia, trichomonas, bacterial vaginosis, or gonorrhea.  Candida was noted.  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  High risk HPV by mRNA E6/A7 was not detected.  The patient has not yet been contacted about returning for any additional work-up given her abnormal Pap smear.   12/03/21: endometrial biopsy, cervical biopsies, cold knife conization of cervix, post-cone ECC  01/14/22: Robotic-assisted laparoscopic total hysterectomy with bilateral salpingoophorectomy, lysis of adhesions for 40 minutes, oversew of small bowel serosa, staging including peritoneal biopsies, omentectomy.  Interval History: Doing well. Improving daily. Sore still.  Bowels normal as long as taking Sennakot. As soon as gets to bathroom, has trouble holding. Feels like she can hold more urine in her bladder. Denies vaginal bleeding or discharge.   Past Medical/Surgical  History: Past Medical History:  Diagnosis Date   Abdominal hernia    Anxiety    Arthritis    Cancer ([place]) 10/2021   cervical   Diverticulosis    GERD (gastroesophageal reflux disease)    History of hiatal hernia    History of kidney stones    age 66   Hypertension    Liver mass    S/P resection and partial hepatioma 2017 at [facility]   Ulcer of gastric fundus     Past Surgical History:  Procedure Laterality Date   CERVICAL CONIZATION W/BX N/A 12/03/2021   Procedure: CONIZATION CERVIX WITH BIOPSY; ENDOCERVICAL BIOPSY;  Surgeon: Lafonda Mosses, MD;  Location: [facility];  Service: Gynecology;  Laterality: N/A;   CHOLECYSTECTOMY  2017   COLONOSCOPY WITH ESOPHAGOGASTRODUODENOSCOPY (EGD)  08/2021   DILATATION & CURETTAGE/HYSTEROSCOPY WITH MYOSURE N/A 01/14/2022   Procedure: DILATATION & CURETTAGE;  Surgeon: Lafonda Mosses, MD;  Location: [facility];  Service: Gynecology;  Laterality: N/A;   ECTOPIC PREGNANCY SURGERY     x2, both tubes removed   OPEN PARTIAL HEPATECTOMY  Right 2017   right lobe benign tumor   ROBOTIC ASSISTED BILATERAL SALPINGO OOPHERECTOMY Bilateral 01/14/2022   Procedure: XI ROBOTIC ASSISTED BILATERAL SALPINGO OOPHORECTOMY;  Surgeon: Lafonda Mosses, MD;  Location: [facility];  Service: Gynecology;  Laterality: Bilateral;   ROBOTIC ASSISTED TOTAL HYSTERECTOMY N/A 01/14/2022   Procedure: XI ROBOTIC ASSISTED TOTAL HYSTERECTOMY, STAGING, PERITONEAL BIOPSIES, OMENTECTOMY, LYSIS OF ADHESIONS, PELVIC WASHINGS;  Surgeon: Lafonda Mosses, MD;  Location: [facility];  Service: Gynecology;  Laterality: N/A;    Family History  Problem Relation Age of Onset   Hypertension Mother    Colon cancer Father 34  Deceased age 43 from colon ca    Social History   Socioeconomic History   Marital status: Married    Spouse name: Hoy Morn   Number of children: 1   Years of education: 14   Highest education level: Associate degree: academic program  Occupational History   Not on  file  Tobacco Use   Smoking status: Every Day    Packs/day: 1.00    Years: 50.00    Total pack years: 50.00    Types: Cigarettes   Smokeless tobacco: Never  Vaping Use   Vaping Use: Never used  Substance and Sexual Activity   Alcohol use: Not Currently    Comment: rare   Drug use: Not Currently    Types: Marijuana   Sexual activity: Not Currently  Other Topics Concern   Not on file  Social History Narrative   Not on file   Social Determinants of Health   Financial Resource Strain: Medium Risk (07/15/2021)   Overall Financial Resource Strain (CARDIA)    Difficulty of Paying Living Expenses: Somewhat hard  Food Insecurity: No Food Insecurity (07/15/2021)   Hunger Vital Sign    Worried About Running Out of Food in the Last Year: Never true    Ran Out of Food in the Last Year: Never true  Transportation Needs: No Transportation Needs (07/15/2021)   PRAPARE - Hydrologist (Medical): No    Lack of Transportation (Non-Medical): No  Physical Activity: Inactive (07/15/2021)   Exercise Vital Sign    Days of Exercise per Week: 0 days    Minutes of Exercise per Session: 0 min  Stress: No Stress Concern Present (07/15/2021)   [place]    Feeling of Stress : Not at all  Social Connections: Moderately Isolated (07/15/2021)   Social Connection and Isolation Panel [NHANES]    Frequency of Communication with Friends and Family: More than three times a week    Frequency of Social Gatherings with Friends and Family: More than three times a week    Attends Religious Services: Never    Marine scientist or Organizations: No    Attends Archivist Meetings: Never    Marital Status: Married    Current Medications:  Current Outpatient Medications:    amLODipine (NORVASC) 5 MG tablet, Take 1 tablet (5 mg total) by mouth daily., Disp: 90 tablet, Rfl: 1   atorvastatin (LIPITOR) 10 MG  tablet, Take 1 tablet (10 mg total) by mouth at bedtime., Disp: 90 tablet, Rfl: 1   buPROPion (WELLBUTRIN XL) 150 MG 24 hr tablet, Take 1 tablet (150 mg total) by mouth daily., Disp: 90 tablet, Rfl: 1   escitalopram (LEXAPRO) 20 MG tablet, Take 1 tablet (20 mg total) by mouth daily., Disp: 90 tablet, Rfl: 1   famotidine (PEPCID) 20 MG tablet, Take 20 mg by mouth 2 (two) times daily., Disp: , Rfl:    hydrOXYzine (ATARAX) 10 MG tablet, Take 1 tablet (10 mg total) by mouth 3 (three) times daily as needed., Disp: 30 tablet, Rfl: 3   ondansetron (ZOFRAN-ODT) 4 MG disintegrating tablet, Take 1 tablet (4 mg total) by mouth every 8 (eight) hours as needed., Disp: 20 tablet, Rfl: 0   pantoprazole (PROTONIX) 40 MG tablet, Take 1 tablet (40 mg total) by mouth daily., Disp: 90 tablet, Rfl: 1   senna-docusate (SENOKOT-S) 8.6-50 MG tablet, Take 2 tablets by mouth at bedtime. For AFTER surgery, do not  take if having diarrhea, Disp: 30 tablet, Rfl: 0   traMADol (ULTRAM) 50 MG tablet, Take 1 tablet (50 mg total) by mouth every 6 (six) hours as needed for severe pain. For AFTER surgery only, do not take and drive, do not take with another narcotic pain medication, Disp: 10 tablet, Rfl: 0   Vitamin D, Ergocalciferol, (DRISDOL) 1.25 MG (50000 UNIT) CAPS capsule, Take 50,000 Units by mouth every Thursday., Disp: , Rfl:   Review of Symptoms: Pertinent positives as per HPI.  Physical Exam: Deferred given limitations of phone visit.  Laboratory & Radiologic Studies: A. OVARY AND FALLOPIAN TUBE, RIGHT, SALPINGO OOPHORECTOMY:  - Mucinous borderline tumor.  - Fallopian tube not identified.   B. UTERUS, CERVIX, LEFT FALLOPIAN TUBE AND OVARY:  - Serosa: Unremarkable.  No neoplasm.  - Endometrium: Endometrial polyps, 2.  Inactive endometrium.  No  hyperplasia or malignancy.  - Myometrium: Uterine leiomyomas, multiple, intramural.  No malignancy.  - Cervix: Chronic cervicitis and giant cell reaction.  No atypia,   dysplasia or malignancy.  - Left fallopian tube: Fimbriated fallopian tube, no significant  abnormality.  - Left ovary: Inclusion cysts.  No neoplasm.  - Right adnexal tissue: Remnant of fallopian tube.  No neoplasm.   C. PELVIC SIDEWALL, RIGHT, BIOPSY:  - Focal dystrophic calcification in fibrous soft tissue.  No neoplasm.   D. PARACOLIC GUTTER, RIGHT, BIOPSY:  - Soft tissue.  No neoplasm.   E. CUL DE SAC, ANTERIOR, BIOPSY:  - Soft tissue.  No neoplasm.   F. CUL DE SAC, POSTERIOR, BIOPSY:  - Chronic inflammation and dystrophic calcification in soft tissue.  No  neoplasm.   G. PERITONEAL, LEFT PELVIC SIDEWALL, BIOPSY:  - Patchy chronic inflammation in soft tissue.  No neoplasm.   H. PARACOLIC GUTTER, LEFT, BIOPSY:  - Soft tissue.  No neoplasm.   I. OMENTUM, RESECTION:  - Fibroadipose soft tissue.  No neoplasm.   Cytology FINAL MICROSCOPIC DIAGNOSIS:  - No malignant cells identified   Assessment & Plan: Theresa Reyes is a 66 y.o. 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  Patient is doing well from a postoperative standpoint.  Discussed continued expectations and restrictions.  Reviewed pathology with her.  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  All other tissue removed negative for borderline involvement.  Given simple appearance of the cyst, she did not have tumor markers prior to surgery.  We will plan to get a CEA and Ca1 25 at her postoperative follow-up to establish normal baseline after surgery.  Discussed closer surveillance in the setting of a borderline tumor, which we will review in more detail at her follow-up.  I discussed the assessment and treatment plan with the patient. The patient was provided with an opportunity to ask questions and all were answered. The patient agreed with the plan and demonstrated an understanding of the instructions.   The  patient was advised to call back or see an in-person evaluation if the symptoms worsen or if the condition fails to improve as anticipated.   12 minutes of total time was spent for this patient encounter, including preparation, phone counseling with the patient and coordination of care, and documentation of the encounter.   Jeral Pinch, MD  Division of Gynecologic Oncology  Department of Obstetrics and Gynecology  [facility]

## 2022-01-23 ENCOUNTER — Telehealth: Payer: Self-pay

## 2022-01-23 NOTE — Telephone Encounter (Signed)
Spoke with Theresa Reyes this morning. She states she is eating, drinking and urinating well. She is moving her bowels daily. Stool is a little hard. Suggested that she add a tablet of senokot-s in the am. She is passing gas as well.  Encouraged her to drink plenty of water. She denies fever or chills. Incisions are dry and intact. She rates her pain 310. Her pain is controlled with tramadol bid.    Instructed to call office with any fever, chills, purulent drainage, uncontrolled pain or any other questions or concerns. Patient verbalizes understanding.   Pt aware of post op appointments as well as the office number [phone] and after hours number [phone] to call if she has any questions or concerns

## 2022-01-30 ENCOUNTER — Telehealth: Payer: Self-pay

## 2022-01-30 NOTE — Telephone Encounter (Signed)
Called the patient back per Melissa APP "Dr Berline Lopes and Lenna Sciara APP can order a CT scan if you think you need to have it. Also if the pain is truly a 10 out of a 10 then you need to go to the ER." Patient stated "I think the scan is to much and the pain is manageable with the tylenol and gabapentin. I also put a belt on and used ice; it helps. If things get worse over the weekend I will either call the after hours or go to the ER."

## 2022-01-30 NOTE — Telephone Encounter (Signed)
Pt called stating for the past few days her left side has been tender. Yesterday it started hurting really bad, she has to catch her breath and sometimes pain is a 10/10.Yesterday she took one of her husbands Gabapentin and it eased her pain. Today she took a Gabapentin and 2 Tylenol. Pain is constant but flares up at times and radiates to her back.  Pain is in between the incisions. Pain is less when she walks and gets worse when she sits. Urinating fine, no pain/pressure/burning. normal BM and passing gas.No pain w/BM's. Not having nausea/vomiting. No fever/chills. She thinks it could be muscle related,but feels no lumps or bumps. She thought she would let Dr.Tucker know.   Pt aware I will make Dr.Tucker aware and will call her with advice

## 2022-02-02 ENCOUNTER — Telehealth: Payer: Self-pay

## 2022-02-02 NOTE — Telephone Encounter (Signed)
Called pt to check in to see how she was doing from previous pain over the last couple of days. Pt stated that she thinks she may have over did it one day and the pain was related to muscle pain.   Pt stated that she is doing good and the pain is no longer 10/10.  Pt states that the pain is more of an aggravating pain but it is relieved with tylenol and wearing the band. Reminded pt to not wear tight blue jeans around incisions, wear stretchy pants instead. Pt denies any fevers, nausea, vomiting, diarrhea.  Pt is eating and drinking well. Informed pt to call with any other concerns or questions.

## 2022-02-12 ENCOUNTER — Inpatient Hospital Stay: Payer: Medicare HMO

## 2022-02-12 ENCOUNTER — Inpatient Hospital Stay: Payer: Medicare HMO | Attending: Gynecologic Oncology | Admitting: Gynecologic Oncology

## 2022-02-12 ENCOUNTER — Encounter: Payer: Self-pay | Admitting: Gynecologic Oncology

## 2022-02-12 ENCOUNTER — Other Ambulatory Visit: Payer: Self-pay

## 2022-02-12 VITALS — BP 149/63 | HR 60 | Temp 98.4°F | Resp 18 | Ht 65.59 in | Wt 183.0 lb

## 2022-02-12 DIAGNOSIS — Z9079 Acquired absence of other genital organ(s): Secondary | ICD-10-CM | POA: Insufficient documentation

## 2022-02-12 DIAGNOSIS — R87613 High grade squamous intraepithelial lesion on cytologic smear of cervix (HGSIL): Secondary | ICD-10-CM

## 2022-02-12 DIAGNOSIS — C561 Malignant neoplasm of right ovary: Secondary | ICD-10-CM | POA: Diagnosis not present

## 2022-02-12 DIAGNOSIS — Z7189 Other specified counseling: Secondary | ICD-10-CM

## 2022-02-12 DIAGNOSIS — Z90710 Acquired absence of both cervix and uterus: Secondary | ICD-10-CM | POA: Insufficient documentation

## 2022-02-12 DIAGNOSIS — N840 Polyp of corpus uteri: Secondary | ICD-10-CM

## 2022-02-12 DIAGNOSIS — Z90722 Acquired absence of ovaries, bilateral: Secondary | ICD-10-CM | POA: Diagnosis not present

## 2022-02-12 DIAGNOSIS — D3911 Neoplasm of uncertain behavior of right ovary: Secondary | ICD-10-CM

## 2022-02-12 NOTE — Progress Notes (Signed)
Gynecologic Oncology Return Clinic Visit  02/12/2022  Reason for Visit: Follow-up after surgery, treatment planning  Treatment History: Pelvic ultrasound on 10/29/21 showed a Uterus 5.7 x 2.8 x 3.6 cm, endometrial lining 9.4 mm. 10.1 x 9.5 x 9.3 cm right adnexa with 9.1 cm simple cyst. Left ovary normal in appearance.    Patient was seen by her OB/GYN on 11/03/2021.  At that time, vaginal swabs were collected which showed no chlamydia, trichomonas, bacterial vaginosis, or gonorrhea.  Candida was noted.  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  High risk HPV by mRNA E6/A7 was not detected.  The patient has not yet been contacted about returning for any additional work-up given her abnormal Pap smear.   12/03/21: endometrial biopsy, cervical biopsies, cold knife conization of cervix, post-cone ECC.  Endometrial biopsy showed benign endometrial polyp.  Cervical biopsies negative.  Endocervical biopsy with high-grade dysplasia.  Cervical cone biopsy with minute foci of high-grade dysplasia at the squamocolumnar junction, negative for invasive carcinoma or glandular atypia. Post cone ECC with normal endocervical tissue.   01/14/22: Robotic-assisted laparoscopic total hysterectomy with bilateral salpingoophorectomy, lysis of adhesions for 40 minutes, oversew of small bowel serosa, staging including peritoneal biopsies, omentectomy.   Interval History: Doing well.  Has some pain still around one of her incisions.  Reports regular bowel and bladder function.  Denies any vaginal bleeding or discharge.  Past Medical/Surgical History: Past Medical History:  Diagnosis Date   Abdominal hernia    Anxiety    Arthritis    Cancer ([place]) 10/2021   cervical   Diverticulosis    GERD (gastroesophageal reflux disease)    History of hiatal hernia    History of kidney stones    age 66   Hypertension    Liver mass    S/P resection and partial hepatioma 2017 at [facility]   Ulcer of gastric fundus      Past Surgical History:  Procedure Laterality Date   CERVICAL CONIZATION W/BX N/A 12/03/2021   Procedure: CONIZATION CERVIX WITH BIOPSY; ENDOCERVICAL BIOPSY;  Surgeon: Lafonda Mosses, MD;  Location: [facility];  Service: Gynecology;  Laterality: N/A;   CHOLECYSTECTOMY  2017   COLONOSCOPY WITH ESOPHAGOGASTRODUODENOSCOPY (EGD)  08/2021   DILATATION & CURETTAGE/HYSTEROSCOPY WITH MYOSURE N/A 01/14/2022   Procedure: DILATATION & CURETTAGE;  Surgeon: Lafonda Mosses, MD;  Location: [facility];  Service: Gynecology;  Laterality: N/A;   ECTOPIC PREGNANCY SURGERY     x2, both tubes removed   OPEN PARTIAL HEPATECTOMY  Right 2017   right lobe benign tumor   ROBOTIC ASSISTED BILATERAL SALPINGO OOPHERECTOMY Bilateral 01/14/2022   Procedure: XI ROBOTIC ASSISTED BILATERAL SALPINGO OOPHORECTOMY;  Surgeon: Lafonda Mosses, MD;  Location: [facility];  Service: Gynecology;  Laterality: Bilateral;   ROBOTIC ASSISTED TOTAL HYSTERECTOMY N/A 01/14/2022   Procedure: XI ROBOTIC ASSISTED TOTAL HYSTERECTOMY, STAGING, PERITONEAL BIOPSIES, OMENTECTOMY, LYSIS OF ADHESIONS, PELVIC WASHINGS;  Surgeon: Lafonda Mosses, MD;  Location: [facility];  Service: Gynecology;  Laterality: N/A;    Family History  Problem Relation Age of Onset   Hypertension Mother    Colon cancer Father 63       Deceased age 98 from colon ca    Social History   Socioeconomic History   Marital status: Married    Spouse name: Hoy Morn   Number of children: 1   Years of education: 14   Highest education level: Associate degree: academic program  Occupational History   Not on file  Tobacco Use   Smoking status: Every Day    Packs/day: 1.00    Years: 50.00    Total pack years: 50.00    Types: Cigarettes   Smokeless tobacco: Never  Vaping Use   Vaping Use: Never used  Substance and Sexual Activity   Alcohol use: Not Currently    Comment: rare   Drug use: Not Currently    Types: Marijuana   Sexual activity: Not Currently  Other  Topics Concern   Not on file  Social History Narrative   Not on file   Social Determinants of Health   Financial Resource Strain: Medium Risk (07/15/2021)   Overall Financial Resource Strain (CARDIA)    Difficulty of Paying Living Expenses: Somewhat hard  Food Insecurity: No Food Insecurity (07/15/2021)   Hunger Vital Sign    Worried About Running Out of Food in the Last Year: Never true    Ran Out of Food in the Last Year: Never true  Transportation Needs: No Transportation Needs (07/15/2021)   PRAPARE - Hydrologist (Medical): No    Lack of Transportation (Non-Medical): No  Physical Activity: Inactive (07/15/2021)   Exercise Vital Sign    Days of Exercise per Week: 0 days    Minutes of Exercise per Session: 0 min  Stress: No Stress Concern Present (07/15/2021)   [place]    Feeling of Stress : Not at all  Social Connections: Moderately Isolated (07/15/2021)   Social Connection and Isolation Panel [NHANES]    Frequency of Communication with Friends and Family: More than three times a week    Frequency of Social Gatherings with Friends and Family: More than three times a week    Attends Religious Services: Never    Marine scientist or Organizations: No    Attends Archivist Meetings: Never    Marital Status: Married    Current Medications:  Current Outpatient Medications:    amLODipine (NORVASC) 5 MG tablet, Take 1 tablet (5 mg total) by mouth daily., Disp: 90 tablet, Rfl: 1   atorvastatin (LIPITOR) 10 MG tablet, Take 1 tablet (10 mg total) by mouth at bedtime., Disp: 90 tablet, Rfl: 1   escitalopram (LEXAPRO) 20 MG tablet, Take 1 tablet (20 mg total) by mouth daily., Disp: 90 tablet, Rfl: 1   famotidine (PEPCID) 20 MG tablet, Take 20 mg by mouth 2 (two) times daily., Disp: , Rfl:    hydrOXYzine (ATARAX) 10 MG tablet, Take 1 tablet (10 mg total) by mouth 3 (three) times  daily as needed., Disp: 30 tablet, Rfl: 3   ondansetron (ZOFRAN-ODT) 4 MG disintegrating tablet, Take 1 tablet (4 mg total) by mouth every 8 (eight) hours as needed., Disp: 20 tablet, Rfl: 0   pantoprazole (PROTONIX) 40 MG tablet, Take 1 tablet (40 mg total) by mouth daily., Disp: 90 tablet, Rfl: 1   Vitamin D, Ergocalciferol, (DRISDOL) 1.25 MG (50000 UNIT) CAPS capsule, Take 50,000 Units by mouth every Thursday., Disp: , Rfl:   Review of Systems: Denies appetite changes, fevers, chills, fatigue, unexplained weight changes. Denies hearing loss, neck lumps or masses, mouth sores, ringing in ears or voice changes. Denies cough or wheezing.  Denies shortness of breath. Denies chest pain or palpitations. Denies leg swelling. Denies abdominal distention, pain, blood in stools, constipation, diarrhea, nausea, vomiting, or early satiety. Denies pain with intercourse, dysuria, frequency, hematuria or incontinence. Denies hot flashes, pelvic pain, vaginal bleeding or  vaginal discharge.   Denies joint pain, back pain or muscle pain/cramps. Denies itching, rash, or wounds. Denies dizziness, headaches, numbness or seizures. Denies swollen lymph nodes or glands, denies easy bruising or bleeding. Denies anxiety, depression, confusion, or decreased concentration.  Physical Exam: BP (!) 149/63 (BP Location: Right Arm, Patient Position: Sitting)   Pulse 60   Temp 98.4 F (36.9 C) (Oral)   Resp 18   Ht 5' 5.59" (1.666 m)   Wt 183 lb (83 kg)   SpO2 99%   BMI 29.91 kg/m  General: Alert, oriented, no acute distress. HEENT: Normocephalic, atraumatic, sclera anicteric. Chest: Unlabored breathing on room air. Abdomen: soft, nontender.  Normoactive bowel sounds.  No masses or hepatosplenomegaly appreciated.  Well-healed incisions.  1 suture excised from left lateral incision.  Scab covering this incision. Extremities: Grossly normal range of motion.  Warm, well perfused.  No edema bilaterally. GU: Normal  appearing external genitalia without erythema, excoriation, or lesions.  Speculum exam reveals no blood or discharge within the vaginal vault.  Cuff intact with suture visible.  Bimanual exam reveals cuff intact, no fluctuance or tenderness to palpation.    Laboratory & Radiologic Studies: A. OVARY AND FALLOPIAN TUBE, RIGHT, SALPINGO OOPHORECTOMY:  - Mucinous borderline tumor.  - Fallopian tube not identified.   B. UTERUS, CERVIX, LEFT FALLOPIAN TUBE AND OVARY:  - Serosa: Unremarkable.  No neoplasm.  - Endometrium: Endometrial polyps, 2.  Inactive endometrium.  No  hyperplasia or malignancy.  - Myometrium: Uterine leiomyomas, multiple, intramural.  No malignancy.  - Cervix: Chronic cervicitis and giant cell reaction.  No atypia,  dysplasia or malignancy.  - Left fallopian tube: Fimbriated fallopian tube, no significant  abnormality.  - Left ovary: Inclusion cysts.  No neoplasm.  - Right adnexal tissue: Remnant of fallopian tube.  No neoplasm.   C. PELVIC SIDEWALL, RIGHT, BIOPSY:  - Focal dystrophic calcification in fibrous soft tissue.  No neoplasm.   D. PARACOLIC GUTTER, RIGHT, BIOPSY:  - Soft tissue.  No neoplasm.   E. CUL DE SAC, ANTERIOR, BIOPSY:  - Soft tissue.  No neoplasm.   F. CUL DE SAC, POSTERIOR, BIOPSY:  - Chronic inflammation and dystrophic calcification in soft tissue.  No  neoplasm.   G. PERITONEAL, LEFT PELVIC SIDEWALL, BIOPSY:  - Patchy chronic inflammation in soft tissue.  No neoplasm.   H. PARACOLIC GUTTER, LEFT, BIOPSY:  - Soft tissue.  No neoplasm.   I. OMENTUM, RESECTION:  - Fibroadipose soft tissue.  No neoplasm.    Cytology FINAL MICROSCOPIC DIAGNOSIS:  - No malignant cells identified   Assessment & Plan: Theresa Reyes is a 66 y.o. 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   Patient is doing well from a postoperative standpoint.  Discussed continued expectations and restrictions.    Reviewed pathology with her again.  Patient was given a copy of her pathology report.  We discussed the diagnosis of mucinous borderline tumor of the ovary, no surface involvement.  All other tissue removed negative for borderline involvement.  Given simple appearance of the cyst, she did not have tumor markers prior to surgery.  We will plan to get a CEA and CA1 25 at her postoperative follow-up to establish normal baseline after surgery.    We discussed that there are no standard guidelines for surveillance in the setting of a borderline tumor.  The NCCN guidelines suggest that we can follow patients as frequently as we do those with ovarian  cancer.  Our SGO guidelines suggest that for an early stage borderline tumor after completion surgery, yearly visits are sufficient.  I discussed various options with the patient and we we will plan to do visits every 6 months alternating between my office and her OB/GYN.  I have given her some recommendations for an OB/GYN at her last visit.  I have asked her to call when she determines who she would like to see.  I will plan to send my note to them.  She will plan to see her OB/GYN in the spring and return to see me in the fall next year.  Given her high-grade cervical dysplasia, diagnosed prior to surgery (although completely excised on cone prior to hysterectomy), the patient will need continued follow-up.  22 minutes of total time was spent for this patient encounter, including preparation, face-to-face counseling with the patient and coordination of care, and documentation of the encounter.  Jeral Pinch, MD  Division of Gynecologic Oncology  Department of Obstetrics and Gynecology  [facility]

## 2022-02-12 NOTE — Patient Instructions (Addendum)
It was good to see you today.  You are healing well from surgery.  Remember, no heavy lifting for 6 weeks after surgery and nothing in the vagina for 8-10.  We talked about today having you follow-up alternating between my office and your OB/GYN.  When you decide who you would like to see as your OB/GYN, please reach out to me so I can send my note from today to them.  We can plan to have you see your OB/GYN in the spring and see me yearly in the fall.  If you develop any new and concerning symptoms such as pelvic or abdominal pain, change to bowel function, or unintentional weight loss, please call to see me sooner.

## 2022-02-13 LAB — CA 125: Cancer Antigen (CA) 125: 14.9 U/mL (ref 0.0–38.1)

## 2022-02-13 LAB — CEA (ACCESS): CEA (CHCC): 3.59 ng/mL (ref 0.00–5.00)

## 2022-02-24 ENCOUNTER — Encounter: Payer: Medicare HMO | Admitting: Gynecologic Oncology

## 2022-03-17 DIAGNOSIS — H25813 Combined forms of age-related cataract, bilateral: Secondary | ICD-10-CM | POA: Diagnosis not present

## 2022-03-17 DIAGNOSIS — H25812 Combined forms of age-related cataract, left eye: Secondary | ICD-10-CM | POA: Diagnosis not present

## 2022-03-17 DIAGNOSIS — H40003 Preglaucoma, unspecified, bilateral: Secondary | ICD-10-CM | POA: Diagnosis not present

## 2022-03-17 DIAGNOSIS — H25811 Combined forms of age-related cataract, right eye: Secondary | ICD-10-CM | POA: Diagnosis not present

## 2022-04-07 DIAGNOSIS — F418 Other specified anxiety disorders: Secondary | ICD-10-CM | POA: Diagnosis not present

## 2022-04-07 DIAGNOSIS — F419 Anxiety disorder, unspecified: Secondary | ICD-10-CM | POA: Diagnosis not present

## 2022-04-07 DIAGNOSIS — Z888 Allergy status to other drugs, medicaments and biological substances status: Secondary | ICD-10-CM | POA: Diagnosis not present

## 2022-04-07 DIAGNOSIS — Z9049 Acquired absence of other specified parts of digestive tract: Secondary | ICD-10-CM | POA: Diagnosis not present

## 2022-04-07 DIAGNOSIS — F17210 Nicotine dependence, cigarettes, uncomplicated: Secondary | ICD-10-CM | POA: Diagnosis not present

## 2022-04-07 DIAGNOSIS — R112 Nausea with vomiting, unspecified: Secondary | ICD-10-CM | POA: Diagnosis not present

## 2022-04-07 DIAGNOSIS — E872 Acidosis, unspecified: Secondary | ICD-10-CM | POA: Diagnosis not present

## 2022-04-07 DIAGNOSIS — Z90710 Acquired absence of both cervix and uterus: Secondary | ICD-10-CM | POA: Diagnosis not present

## 2022-04-07 DIAGNOSIS — K529 Noninfective gastroenteritis and colitis, unspecified: Secondary | ICD-10-CM | POA: Diagnosis not present

## 2022-04-07 DIAGNOSIS — K5790 Diverticulosis of intestine, part unspecified, without perforation or abscess without bleeding: Secondary | ICD-10-CM | POA: Diagnosis not present

## 2022-04-07 DIAGNOSIS — D72829 Elevated white blood cell count, unspecified: Secondary | ICD-10-CM | POA: Diagnosis not present

## 2022-04-07 DIAGNOSIS — K219 Gastro-esophageal reflux disease without esophagitis: Secondary | ICD-10-CM | POA: Diagnosis not present

## 2022-04-07 DIAGNOSIS — R531 Weakness: Secondary | ICD-10-CM | POA: Diagnosis not present

## 2022-04-07 DIAGNOSIS — I10 Essential (primary) hypertension: Secondary | ICD-10-CM | POA: Diagnosis not present

## 2022-04-07 DIAGNOSIS — Z79899 Other long term (current) drug therapy: Secondary | ICD-10-CM | POA: Diagnosis not present

## 2022-04-07 DIAGNOSIS — K838 Other specified diseases of biliary tract: Secondary | ICD-10-CM | POA: Diagnosis not present

## 2022-04-07 DIAGNOSIS — K5792 Diverticulitis of intestine, part unspecified, without perforation or abscess without bleeding: Secondary | ICD-10-CM | POA: Diagnosis not present

## 2022-04-07 DIAGNOSIS — K5730 Diverticulosis of large intestine without perforation or abscess without bleeding: Secondary | ICD-10-CM | POA: Diagnosis not present

## 2022-04-08 DIAGNOSIS — K529 Noninfective gastroenteritis and colitis, unspecified: Secondary | ICD-10-CM | POA: Diagnosis not present

## 2022-04-08 DIAGNOSIS — I10 Essential (primary) hypertension: Secondary | ICD-10-CM | POA: Diagnosis not present

## 2022-04-08 DIAGNOSIS — F418 Other specified anxiety disorders: Secondary | ICD-10-CM | POA: Diagnosis not present

## 2022-04-08 DIAGNOSIS — K5792 Diverticulitis of intestine, part unspecified, without perforation or abscess without bleeding: Secondary | ICD-10-CM | POA: Diagnosis not present

## 2022-04-09 DIAGNOSIS — K5792 Diverticulitis of intestine, part unspecified, without perforation or abscess without bleeding: Secondary | ICD-10-CM | POA: Diagnosis not present

## 2022-04-09 DIAGNOSIS — K529 Noninfective gastroenteritis and colitis, unspecified: Secondary | ICD-10-CM | POA: Diagnosis not present

## 2022-04-09 DIAGNOSIS — F418 Other specified anxiety disorders: Secondary | ICD-10-CM | POA: Diagnosis not present

## 2022-04-09 DIAGNOSIS — I10 Essential (primary) hypertension: Secondary | ICD-10-CM | POA: Diagnosis not present

## 2022-04-10 DIAGNOSIS — E876 Hypokalemia: Secondary | ICD-10-CM | POA: Diagnosis not present

## 2022-04-10 DIAGNOSIS — F418 Other specified anxiety disorders: Secondary | ICD-10-CM | POA: Diagnosis not present

## 2022-04-10 DIAGNOSIS — I10 Essential (primary) hypertension: Secondary | ICD-10-CM | POA: Diagnosis not present

## 2022-04-10 DIAGNOSIS — K529 Noninfective gastroenteritis and colitis, unspecified: Secondary | ICD-10-CM | POA: Diagnosis not present

## 2022-04-10 DIAGNOSIS — K5792 Diverticulitis of intestine, part unspecified, without perforation or abscess without bleeding: Secondary | ICD-10-CM | POA: Diagnosis not present

## 2022-04-13 DIAGNOSIS — H40003 Preglaucoma, unspecified, bilateral: Secondary | ICD-10-CM | POA: Diagnosis not present

## 2022-04-13 DIAGNOSIS — H25812 Combined forms of age-related cataract, left eye: Secondary | ICD-10-CM | POA: Diagnosis not present

## 2022-04-13 DIAGNOSIS — H25811 Combined forms of age-related cataract, right eye: Secondary | ICD-10-CM | POA: Diagnosis not present

## 2022-04-14 ENCOUNTER — Encounter: Payer: Self-pay | Admitting: *Deleted

## 2022-04-14 ENCOUNTER — Telehealth: Payer: Self-pay | Admitting: *Deleted

## 2022-04-14 NOTE — Patient Outreach (Signed)
  Care Coordination TOC Note Transition Care Management Follow-up Telephone Call Date of discharge and from where: Friday, 04/10/22, [facility]; gastroenteritis with fever How have you been since you were released from the hospital? "I am much much better-- back to doing all of the things I normally do, not needing assistance with anything-- but my husband and son are keeping a close eye out on me; my appetite is much better and I am taking all of my medicine the way I am supposed to; I am walking and doing fine, I feel very steady on my feet" Any questions or concerns? No  Items Reviewed: Did the pt receive and understand the discharge instructions provided? Yes  Medications obtained and verified? Yes  Other? No  Any new allergies since your discharge? No  Dietary orders reviewed? Yes Do you have support at home? Yes  husband and son providing support as needed/ indicated  Home Care and Equipment/Supplies: Were home health services ordered? no If so, what is the name of the agency? N/A  Has the agency set up a time to come to the patient's home? not applicable Were any new equipment or medical supplies ordered?  No What is the name of the medical supply agency? N/A Were you able to get the supplies/equipment? not applicable Do you have any questions related to the use of the equipment or supplies? No N/A  Functional Questionnaire: (I = Independent and D = Dependent) ADLs: I  husband and son providing support as needed/ indicated  Bathing/Dressing- I  Meal Prep- I  husband and son providing support as needed/ indicated  Eating- I  Maintaining continence- I  Transferring/Ambulation- I  Managing Meds- I  Follow up appointments reviewed:  PCP Hospital f/u appt confirmed? Yes  Scheduled to see PCP, Dr. Hassell Done on Thursday 04/23/22 @ 11:30 am Specialist Hospital f/u appt confirmed? No  Scheduled to see - on - @ - Are transportation arrangements needed? No  If their condition  worsens, is the pt aware to call PCP or go to the Emergency Dept.? Yes Was the patient provided with contact information for the PCP's office or ED? No- declined; verified that she has provider contact information Was to pt encouraged to call back with questions or concerns? Yes  SDOH assessments and interventions completed:   Yes  Care Coordination Interventions Activated:  No   Care Coordination Interventions:  No Care Coordination interventions needed at this time.   Encounter Outcome:  Pt. Visit Completed    Oneta Rack, RN, BSN, CCRN Alumnus RN CM Care Coordination/ Transition of [place] Management [phone]: direct office

## 2022-04-21 ENCOUNTER — Ambulatory Visit: Payer: Medicare HMO | Admitting: Nurse Practitioner

## 2022-04-23 ENCOUNTER — Encounter: Payer: Self-pay | Admitting: Nurse Practitioner

## 2022-04-23 ENCOUNTER — Ambulatory Visit (INDEPENDENT_AMBULATORY_CARE_PROVIDER_SITE_OTHER): Payer: Medicare HMO | Admitting: Nurse Practitioner

## 2022-04-23 VITALS — BP 131/80 | HR 63 | Temp 97.0°F | Resp 20 | Ht 65.0 in | Wt 188.0 lb

## 2022-04-23 DIAGNOSIS — K529 Noninfective gastroenteritis and colitis, unspecified: Secondary | ICD-10-CM | POA: Diagnosis not present

## 2022-04-23 DIAGNOSIS — Z01818 Encounter for other preprocedural examination: Secondary | ICD-10-CM | POA: Diagnosis not present

## 2022-04-23 DIAGNOSIS — Z09 Encounter for follow-up examination after completed treatment for conditions other than malignant neoplasm: Secondary | ICD-10-CM

## 2022-04-23 NOTE — Progress Notes (Signed)
Subjective:    Patient ID: Theresa Reyes, female    DOB: 04/04/1956, 66 y.o.   MRN: [id number]   Chief Complaint: Hospitalization Follow-up and Surgical clearance   HPI  Patient was admitted b to the hospital on 04/07/22 with gastroenteritis. She was discharged on 04/10/22. On top of the gastroenteritis, she was dx with diverticulitis. They also started her on metoprolol due  to elevated blood pressure in hospital. She was not discharged home on any antibiotics. She also stopped the metoprolol about 5 days after going home. Blood pressure at  home has been 140's. Patient says she feels really well.    Is scheduled to have cataract surgery on 04/27/22.  Review of Systems  Constitutional:  Negative for diaphoresis.  Eyes:  Negative for pain.  Respiratory:  Negative for shortness of breath.   Cardiovascular:  Negative for chest pain, palpitations and leg swelling.  Gastrointestinal:  Negative for abdominal pain.  Endocrine: Negative for polydipsia.  Skin:  Negative for rash.  Neurological:  Negative for dizziness, weakness and headaches.  Hematological:  Does not bruise/bleed easily.  All other systems reviewed and are negative.      Objective:   Physical Exam Vitals and nursing note reviewed.  Constitutional:      General: She is not in acute distress.    Appearance: Normal appearance. She is well-developed.  Neck:     Vascular: No carotid bruit or JVD.  Cardiovascular:     Rate and Rhythm: Normal rate and regular rhythm.     Heart sounds: Murmur (3/6 systolic) heard.  Pulmonary:     Effort: Pulmonary effort is normal. No respiratory distress.     Breath sounds: Normal breath sounds. No wheezing or rales.  Chest:     Chest wall: No tenderness.  Abdominal:     General: Bowel sounds are normal. There is no distension or abdominal bruit.     Palpations: Abdomen is soft. There is no hepatomegaly, splenomegaly, mass or pulsatile mass.     Tenderness: There is no abdominal  tenderness.  Musculoskeletal:        General: Normal range of motion.     Cervical back: Normal range of motion and neck supple.  Lymphadenopathy:     Cervical: No cervical adenopathy.  Skin:    General: Skin is warm and dry.  Neurological:     Mental Status: She is alert and oriented to person, place, and time.     Deep Tendon Reflexes: Reflexes are normal and symmetric.  Psychiatric:        Behavior: Behavior normal.        Thought Content: Thought content normal.        Judgment: Judgment normal.     BP 131/80   Pulse 63   Temp (!) 97 F (36.1 C) (Temporal)   Resp 20   Ht '5\' 5"'$  (1.651 m)   Wt 188 lb (85.3 kg)   SpO2 97%   BMI 31.28 kg/m        Assessment & Plan:  Theresa Reyes in today with chief complaint of Hospitalization Follow-up and Surgical clearance   1. Gastroenteritis Avoid spicy foods Do not eat 2 hours prior to bedtime   2. Hospital discharge follow-up Hospital records reviewed  3. Pre-operative clearance Cleared for cataract surgery    The above assessment and management plan was discussed with the patient. The patient verbalized understanding of and has agreed to the management plan. Patient is aware to call the clinic  if symptoms persist or worsen. Patient is aware when to return to the clinic for a follow-up visit. Patient educated on when it is appropriate to go to the emergency department.   Mary-Margaret Hassell Done, FNP

## 2022-04-23 NOTE — Patient Instructions (Signed)
Viral Gastroenteritis, Adult  Viral gastroenteritis is also known as the stomach flu. This condition may affect your stomach, your small intestine, and your large intestine. It can cause sudden watery poop (diarrhea), fever, and vomiting. This condition is caused by certain germs (viruses). These germs can be passed from person to person very easily (are contagious). Having watery poop and vomiting can make you feel weak and cause you to not have enough water in your body (get dehydrated). This can make you tired and thirsty, make you have a dry mouth, and make it so you pee (urinate) less often. It is important to replace the fluids that you lose from having watery poop and vomiting. What are the causes? You can get sick by catching germs from other people. You can also get sick by: Eating food, drinking water, or touching a surface that has the germs on it (is contaminated). Sharing utensils or other personal items with a person who is sick. What increases the risk? Having a weak body defense system (immune system). Living with one or more children who are younger than 2 years. Living in a nursing home. Going on cruise ships. What are the signs or symptoms? Symptoms of this condition start suddenly. Symptoms may last for a few days or for as long as a week. Common symptoms include: Watery poop. Vomiting. Other symptoms include: Fever. Headache. Feeling tired (fatigue). Pain in the belly (abdomen). Chills. Feeling weak. Feeling like you may vomit (nauseous). Muscle aches. Not feeling hungry. How is this treated? This condition typically goes away on its own. The focus of treatment is to replace the fluids that you lose. This condition may be treated with: An ORS (oral rehydration solution). This is a drink that helps you replace fluids and minerals your body lost. It is sold at pharmacies and stores. Medicines to help with your symptoms. Probiotic supplements to reduce symptoms of  watery poop. Fluids given through an IV tube, if needed. Older adults and people with other diseases or a weak body defense system are at higher risk for not having enough water in the body. Follow these instructions at home: Eating and drinking  Take an ORS as told by your doctor. Drink clear fluids in small amounts as you are able. Clear fluids include: Water. Ice chips. Fruit juice that has water added to it (is diluted). Low-calorie sports drinks. Drink enough fluid to keep your pee (urine) pale yellow. Eat small amounts of healthy foods every 3-4 hours as you are able. This may include whole grains, fruits, vegetables, lean meats, and yogurt. Avoid fluids that have a lot of sugar or caffeine in them. This includes energy drinks, sports drinks, and soda. Avoid spicy or fatty foods. Avoid alcohol. General instructions  Wash your hands often. This is very important after you have watery poop or you vomit. If you cannot use soap and water, use hand sanitizer. Make sure that all people in your home wash their hands well and often. Take over-the-counter and prescription medicines only as told by your doctor. Rest at home while you get better. Watch your condition for any changes. Take a warm bath to help with any burning or pain from having watery poop. Keep all follow-up visits. Contact a doctor if: You cannot keep fluids down. Your symptoms get worse. You have new symptoms. You feel light-headed or dizzy. You have muscle cramps. Get help right away if: You have chest pain. You have trouble breathing, or you are breathing very fast.   You have a fast heartbeat. You feel very weak or you faint. You have a very bad headache, a stiff neck, or both. You have a rash. You have very bad pain, cramping, or bloating in your belly. Your skin feels cold and clammy. You feel mixed up (confused). You have pain when you pee. You have signs of not having enough water in the body, such  as: Dark pee, hardly any pee, or no pee. Cracked lips. Dry mouth. Sunken eyes. Feeling very sleepy. Feeling weak. You have signs of bleeding, such as: You see blood in your vomit. Your vomit looks like coffee grounds. You have bloody or black poop or poop that looks like tar. These symptoms may be an emergency. Get help right away. Call 911. Do not wait to see if the symptoms will go away. Do not drive yourself to the hospital. Summary Viral gastroenteritis is also known as the stomach flu. This condition can cause sudden watery poop (diarrhea), fever, and vomiting. These germs can be passed from person to person very easily. Take an ORS (oral rehydration solution) as told by your doctor. This is a drink that is sold at pharmacies and stores. Wash your hands often, especially after having watery poop or vomiting. If you cannot use soap and water, use hand sanitizer. This information is not intended to replace advice given to you by your health care provider. Make sure you discuss any questions you have with your health care provider. Document Revised: 03/10/2021 Document Reviewed: 03/10/2021 Elsevier Patient Education  2023 [employer]  

## 2022-04-27 DIAGNOSIS — H2511 Age-related nuclear cataract, right eye: Secondary | ICD-10-CM | POA: Diagnosis not present

## 2022-04-27 DIAGNOSIS — H269 Unspecified cataract: Secondary | ICD-10-CM | POA: Diagnosis not present

## 2022-04-27 DIAGNOSIS — H25811 Combined forms of age-related cataract, right eye: Secondary | ICD-10-CM | POA: Diagnosis not present

## 2022-05-11 DIAGNOSIS — H269 Unspecified cataract: Secondary | ICD-10-CM | POA: Diagnosis not present

## 2022-05-11 DIAGNOSIS — H25812 Combined forms of age-related cataract, left eye: Secondary | ICD-10-CM | POA: Diagnosis not present

## 2022-06-02 ENCOUNTER — Other Ambulatory Visit: Payer: Self-pay | Admitting: *Deleted

## 2022-06-02 DIAGNOSIS — K2100 Gastro-esophageal reflux disease with esophagitis, without bleeding: Secondary | ICD-10-CM

## 2022-06-02 DIAGNOSIS — I10 Essential (primary) hypertension: Secondary | ICD-10-CM

## 2022-06-02 MED ORDER — ATORVASTATIN CALCIUM 10 MG PO TABS: 10.0000 mg | ORAL_TABLET | Freq: Every day | ORAL | 0 refills | Status: DC

## 2022-06-02 MED ORDER — PANTOPRAZOLE SODIUM 40 MG PO TBEC: 40.0000 mg | DELAYED_RELEASE_TABLET | Freq: Every day | ORAL | 0 refills | Status: DC

## 2022-06-02 MED ORDER — AMLODIPINE BESYLATE 5 MG PO TABS: 5.0000 mg | ORAL_TABLET | Freq: Every day | ORAL | 0 refills | Status: DC

## 2022-07-02 ENCOUNTER — Ambulatory Visit (INDEPENDENT_AMBULATORY_CARE_PROVIDER_SITE_OTHER): Payer: Medicare HMO | Admitting: Nurse Practitioner

## 2022-07-02 ENCOUNTER — Ambulatory Visit: Payer: Medicare HMO | Admitting: Nurse Practitioner

## 2022-07-02 ENCOUNTER — Encounter: Payer: Self-pay | Admitting: Nurse Practitioner

## 2022-07-02 VITALS — BP 135/73 | HR 61 | Temp 97.7°F | Resp 20 | Ht 65.0 in | Wt 184.0 lb

## 2022-07-02 DIAGNOSIS — E876 Hypokalemia: Secondary | ICD-10-CM | POA: Diagnosis not present

## 2022-07-02 DIAGNOSIS — M25562 Pain in left knee: Secondary | ICD-10-CM

## 2022-07-02 DIAGNOSIS — N9489 Other specified conditions associated with female genital organs and menstrual cycle: Secondary | ICD-10-CM | POA: Diagnosis not present

## 2022-07-02 DIAGNOSIS — F3341 Major depressive disorder, recurrent, in partial remission: Secondary | ICD-10-CM | POA: Diagnosis not present

## 2022-07-02 DIAGNOSIS — F419 Anxiety disorder, unspecified: Secondary | ICD-10-CM

## 2022-07-02 DIAGNOSIS — K5751 Diverticulosis of both small and large intestine without perforation or abscess with bleeding: Secondary | ICD-10-CM | POA: Diagnosis not present

## 2022-07-02 DIAGNOSIS — Z6828 Body mass index (BMI) 28.0-28.9, adult: Secondary | ICD-10-CM

## 2022-07-02 DIAGNOSIS — R160 Hepatomegaly, not elsewhere classified: Secondary | ICD-10-CM | POA: Diagnosis not present

## 2022-07-02 DIAGNOSIS — Z23 Encounter for immunization: Secondary | ICD-10-CM

## 2022-07-02 DIAGNOSIS — I10 Essential (primary) hypertension: Secondary | ICD-10-CM

## 2022-07-02 DIAGNOSIS — K2100 Gastro-esophageal reflux disease with esophagitis, without bleeding: Secondary | ICD-10-CM

## 2022-07-02 DIAGNOSIS — M25561 Pain in right knee: Secondary | ICD-10-CM

## 2022-07-02 DIAGNOSIS — Z720 Tobacco use: Secondary | ICD-10-CM

## 2022-07-02 LAB — CBC WITH DIFFERENTIAL/PLATELET

## 2022-07-02 LAB — LIPID PANEL

## 2022-07-02 MED ORDER — AMLODIPINE BESYLATE 5 MG PO TABS: 5.0000 mg | ORAL_TABLET | Freq: Every day | ORAL | 0 refills | Status: DC

## 2022-07-02 MED ORDER — PANTOPRAZOLE SODIUM 40 MG PO TBEC: 40.0000 mg | DELAYED_RELEASE_TABLET | Freq: Every day | ORAL | 0 refills | Status: DC

## 2022-07-02 MED ORDER — CELECOXIB 200 MG PO CAPS: 200.0000 mg | ORAL_CAPSULE | Freq: Two times a day (BID) | ORAL | 5 refills | Status: DC

## 2022-07-02 MED ORDER — VITAMIN D (ERGOCALCIFEROL) 1.25 MG (50000 UNIT) PO CAPS: 50000.0000 [IU] | ORAL_CAPSULE | ORAL | 1 refills | Status: DC

## 2022-07-02 MED ORDER — ATORVASTATIN CALCIUM 10 MG PO TABS: 10.0000 mg | ORAL_TABLET | Freq: Every day | ORAL | 0 refills | Status: DC

## 2022-07-02 MED ORDER — ESCITALOPRAM OXALATE 20 MG PO TABS: 20.0000 mg | ORAL_TABLET | Freq: Every day | ORAL | 1 refills | Status: DC

## 2022-07-02 MED ORDER — FAMOTIDINE 20 MG PO TABS: 20.0000 mg | ORAL_TABLET | Freq: Two times a day (BID) | ORAL | 1 refills | Status: DC

## 2022-07-02 NOTE — Patient Instructions (Signed)
Acute Knee Pain, Adult Many things can cause knee pain. Sometimes, knee pain is sudden (acute) and may be caused by damage, swelling, or irritation of the muscles and tissues that support your knee. The pain often goes away on its own with time and rest. If the pain does not go away, tests may be done to find out what is causing the pain. Follow these instructions at home: If you have a knee sleeve or brace:  Wear the knee sleeve or brace as told by your doctor. Take it off only as told by your doctor. Loosen it if your toes: Tingle. Become numb. Turn cold and blue. Keep it clean. If the knee sleeve or brace is not waterproof: Do not let it get wet. Cover it with a watertight covering when you take a bath or shower. Activity Rest your knee. Do not do things that cause pain or make pain worse. Avoid activities where both feet leave the ground at the same time (high-impact activities). Examples are running, jumping rope, and doing jumping jacks. Work with a physical therapist to make a safe exercise program, as told by your doctor. Managing pain, stiffness, and swelling  If told, put ice on the knee. To do this: If you have a removable knee sleeve or brace, take it off as told by your doctor. Put ice in a plastic bag. Place a towel between your skin and the bag. Leave the ice on for 20 minutes, 2-3 times a day. Take off the ice if your skin turns bright red. This is very important. If you cannot feel pain, heat, or cold, you have a greater risk of damage to the area. If told, use an elastic bandage to put pressure (compression) on your injured knee. Raise your knee above the level of your heart while you are sitting or lying down. Sleep with a pillow under your knee. General instructions Take over-the-counter and prescription medicines only as told by your doctor. Do not smoke or use any products that contain nicotine or tobacco. If you need help quitting, ask your doctor. If you are  overweight, work with your doctor and a food expert (dietitian) to set goals to lose weight. Being overweight can make your knee hurt more. Watch for any changes in your symptoms. Keep all follow-up visits. Contact a doctor if: The knee pain does not stop. The knee pain changes or gets worse. You have a fever along with knee pain. Your knee is red or feels warm when you touch it. Your knee gives out or locks up. Get help right away if: Your knee swells, and the swelling gets worse. You cannot move your knee. You have very bad knee pain that does not get better with pain medicine. Summary Many things can cause knee pain. The pain often goes away on its own with time and rest. Your doctor may do tests to find out the cause of the pain. Watch for any changes in your symptoms. Relieve your pain with rest, medicines, light activity, and use of ice. Get help right away if you cannot move your knee or your knee pain is very bad. This information is not intended to replace advice given to you by your health care provider. Make sure you discuss any questions you have with your health care provider. Document Revised: 10/25/2019 Document Reviewed: 10/25/2019 Elsevier Patient Education  2023 [employer]  

## 2022-07-02 NOTE — Addendum Note (Signed)
Addended by: Chevis Pretty on: 07/02/2022 11:57 AM   Modules accepted: Level of Service

## 2022-07-02 NOTE — Progress Notes (Signed)
Subjective:    Patient ID: Theresa Reyes, female    DOB: 03/10/1956, 67 y.o.   MRN: [id number]   Chief Complaint: medical management of chronic issues     HPI:  Theresa Reyes is a 67 y.o. who identifies as a female who was assigned female at birth.   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 Essential hypertension No c/o chest pain, sob or headche. Does not check bloodpressure athome. BP Readings from Last 3 Encounters:  04/23/22 131/80  02/12/22 (!) 149/63  01/14/22 (!) 147/80     2. Hypokalemia  C/o pf any muscle cramps Lab Results  Component Value Date   K 4.3 01/02/2022     3. Diverticulosis  large intestine w/o perforation or abscess w/o bleeding Had flare up several moinths ago but  is well now.  4. Gastroesophageal reflux disease with esophagitis without hemorrhage Is on protonix an dis doing well.  5. Liver mass No longer anything there  6. Adnexal mass Had hysterectomy in June 2023  7. Anxiety Is on atarax which helps some with her anxiety.    07/02/2022   11:33 AM 04/23/2022   11:30 AM 12/29/2021    2:29 PM 08/18/2021   12:22 PM  GAD 7 : Generalized Anxiety Score  Nervous, Anxious, on Edge 0 '1 3 1  '$ Control/stop worrying 0 '1 1 1  '$ Worry too much - different things 0 '1 1 1  '$ Trouble relaxing 0 '1 3 2  '$ Restless 0 '1 2 1  '$ Easily annoyed or irritable 0 '1 3 1  '$ Afraid - awful might happen 0 1 0 1  Total GAD 7 Score 0 '7 13 8  '$ Anxiety Difficulty Not difficult at all Somewhat difficult Somewhat difficult Somewhat difficult      8. Recurrent major depressive disorder, in partial remission ([place]) Is on lexapro. She says he is doing ok considering the things that are going on .    07/02/2022   11:32 AM 04/23/2022   11:31 AM 04/23/2022   11:30 AM  Depression screen PHQ 2/9  Decreased Interest 0 1 0   Down, Depressed, Hopeless 0 1 0  PHQ - 2 Score 0 2 0  Altered sleeping 0 1 0  Tired, decreased energy 0 1 0  Change in appetite 0 1 0  Feeling bad or failure about yourself  0 0 0  Trouble concentrating 0 0 0  Moving slowly or fidgety/restless 0 0 0  Suicidal thoughts 0 0 0  PHQ-9 Score 0 5 0  Difficult doing work/chores Not difficult at all Somewhat difficult Not difficult at all     9. Tobacco abuse Smokes over a pack a day. Refuses low dose CT scan  10. BMI 28.0-28.9,adult Wt Readings from Last 3 Encounters:  07/02/22 184 lb (83.5 kg)  04/23/22 188 lb (85.3 kg)  02/12/22 183 lb (83 kg)   BMI Readings from Last 3 Encounters:  07/02/22 30.62 kg/m  04/23/22 31.28 kg/m  02/12/22 29.91 kg/m      New complaints: Bil knee pain- some days worse then others  Allergies  Allergen Reactions   Lisinopril     Vertigo   Outpatient Encounter Medications as of 07/02/2022  Medication Sig   amLODipine (NORVASC) 5 MG tablet Take 1 tablet (5 mg total) by mouth daily.   atorvastatin (LIPITOR) 10  MG tablet Take 1 tablet (10 mg total) by mouth at bedtime.   escitalopram (LEXAPRO) 20 MG tablet Take 1 tablet (20 mg total) by mouth daily.   famotidine (PEPCID) 20 MG tablet Take 20 mg by mouth 2 (two) times daily.   hydrOXYzine (ATARAX) 10 MG tablet Take 1 tablet (10 mg total) by mouth 3 (three) times daily as needed.   ondansetron (ZOFRAN-ODT) 4 MG disintegrating tablet Take 1 tablet (4 mg total) by mouth every 8 (eight) hours as needed.   pantoprazole (PROTONIX) 40 MG tablet Take 1 tablet (40 mg total) by mouth daily.   Vitamin D, Ergocalciferol, (DRISDOL) 1.25 MG (50000 UNIT) CAPS capsule Take 50,000 Units by mouth every Thursday.   No facility-administered encounter medications on file as of 07/02/2022.    Past Surgical History:  Procedure Laterality Date   CERVICAL CONIZATION W/BX N/A 12/03/2021   Procedure: CONIZATION CERVIX WITH BIOPSY; ENDOCERVICAL BIOPSY;  Surgeon: Lafonda Mosses, MD;  Location: [facility];  Service: Gynecology;  Laterality: N/A;   CHOLECYSTECTOMY  2017   COLONOSCOPY WITH ESOPHAGOGASTRODUODENOSCOPY (EGD)  08/2021   DILATATION & CURETTAGE/HYSTEROSCOPY WITH MYOSURE N/A 01/14/2022   Procedure: DILATATION & CURETTAGE;  Surgeon: Lafonda Mosses, MD;  Location: [facility];  Service: Gynecology;  Laterality: N/A;   ECTOPIC PREGNANCY SURGERY     x2, both tubes removed   OPEN PARTIAL HEPATECTOMY  Right 2017   right lobe benign tumor   ROBOTIC ASSISTED BILATERAL SALPINGO OOPHERECTOMY Bilateral 01/14/2022   Procedure: XI ROBOTIC ASSISTED BILATERAL SALPINGO OOPHORECTOMY;  Surgeon: Lafonda Mosses, MD;  Location: [facility];  Service: Gynecology;  Laterality: Bilateral;   ROBOTIC ASSISTED TOTAL HYSTERECTOMY N/A 01/14/2022   Procedure: XI ROBOTIC ASSISTED TOTAL HYSTERECTOMY, STAGING, PERITONEAL BIOPSIES, OMENTECTOMY, LYSIS OF ADHESIONS, PELVIC WASHINGS;  Surgeon: Lafonda Mosses, MD;  Location: [facility];  Service: Gynecology;  Laterality: N/A;    Family History  Problem Relation Age of Onset   Hypertension Mother    Colon cancer Father 76       Deceased age 15 from colon ca      Controlled substance contract: n/a     Review of Systems  Constitutional:  Negative for diaphoresis.  Eyes:  Negative for pain.  Respiratory:  Negative for shortness of breath.   Cardiovascular:  Negative for chest pain, palpitations and leg swelling.  Gastrointestinal:  Negative for abdominal pain.  Endocrine: Negative for polydipsia.  Skin:  Negative for rash.  Neurological:  Negative for dizziness, weakness and headaches.  Hematological:  Does not bruise/bleed easily.  All other systems reviewed and are negative.      Objective:   Physical Exam Vitals and nursing note reviewed.  Constitutional:      General: She is not in acute distress.    Appearance: Normal appearance. She is well-developed.  HENT:     Head: Normocephalic.     Right Ear: Tympanic  membrane normal.     Left Ear: Tympanic membrane normal.     Nose: Nose normal.     Mouth/Throat:     Mouth: Mucous membranes are moist.  Eyes:     Pupils: Pupils are equal, round, and reactive to light.  Neck:     Vascular: No carotid bruit or JVD.  Cardiovascular:     Rate and Rhythm: Normal rate and regular rhythm.     Heart sounds: Normal heart sounds.  Pulmonary:     Effort: Pulmonary effort is normal. No respiratory distress.  Breath sounds: Normal breath sounds. No wheezing or rales.  Chest:     Chest wall: No tenderness.  Abdominal:     General: Bowel sounds are normal. There is no distension or abdominal bruit.     Palpations: Abdomen is soft. There is no hepatomegaly, splenomegaly, mass or pulsatile mass.     Tenderness: There is no abdominal tenderness.  Musculoskeletal:        General: Normal range of motion.     Cervical back: Normal range of motion and neck supple.     Comments: Bil knee effusion FROM with crepitus on flexion and extension  Lymphadenopathy:     Cervical: No cervical adenopathy.  Skin:    General: Skin is warm and dry.  Neurological:     Mental Status: She is alert and oriented to person, place, and time.     Deep Tendon Reflexes: Reflexes are normal and symmetric.  Psychiatric:        Behavior: Behavior normal.        Thought Content: Thought content normal.        Judgment: Judgment normal.    BP 135/73   Pulse 61   Temp 97.7 F (36.5 C) (Temporal)   Resp 20   Ht '5\' 5"'$  (1.651 m)   Wt 184 lb (83.5 kg)   SpO2 99%   BMI 30.62 kg/m         Assessment & Plan:  Alecea Trego comes in today with chief complaint of Medical Management of Chronic Issues   Diagnosis and orders addressed:  1. Essential hypertension Low sodium diet - CBC with Differential/Platelet - Lipid panel - CMP14+EGFR - amLODipine (NORVASC) 5 MG tablet; Take 1 tablet (5 mg total) by mouth daily.  Dispense: 90 tablet; Refill: 0 - atorvastatin (LIPITOR) 10  MG tablet; Take 1 tablet (10 mg total) by mouth at bedtime.  Dispense: 90 tablet; Refill: 0 - Vitamin D, Ergocalciferol, (DRISDOL) 1.25 MG (50000 UNIT) CAPS capsule; Take 1 capsule (50,000 Units total) by mouth every Thursday.  Dispense: 12 capsule; Refill: 1  2. Hypokalemia Labs pending  3. Diverticulosis of both small and large intestine without perforation or abscess with bleeding Watch diet  4. Gastroesophageal reflux disease with esophagitis without hemorrhage Avoid spicy foods Do not eat 2 hours prior to bedtime - pantoprazole (PROTONIX) 40 MG tablet; Take 1 tablet (40 mg total) by mouth daily.  Dispense: 90 tablet; Refill: 0 - famotidine (PEPCID) 20 MG tablet; Take 1 tablet (20 mg total) by mouth 2 (two) times daily.  Dispense: 90 tablet; Refill: 1  5. Liver mass 6. Adnexal mass No need to follow up with oncology  7. Anxiety 8. Recurrent major depressive disorder, in partial remission ([facility]) Stress management - escitalopram (LEXAPRO) 20 MG tablet; Take 1 tablet (20 mg total) by mouth daily.  Dispense: 90 tablet; Refill: 1  9. Tobacco abuse Smoking cessation encouraged  10. BMI 28.0-28.9,adult Discussed diet and exercise for person with BMI >25 Will recheck weight in 3-6 months   11. Acute pain of both knees Moist heat rest - celecoxib (CELEBREX) 200 MG capsule; Take 1 capsule (200 mg total) by mouth 2 (two) times daily.  Dispense: 60 capsule; Refill: 5   Labs pending Health Maintenance reviewed Diet and exercise encouraged  Follow up plan: 6 months   Mary-Margaret Hassell Done, FNP

## 2022-07-03 LAB — CBC WITH DIFFERENTIAL/PLATELET
Basophils Absolute: 0.1 10*3/uL (ref 0.0–0.2)
Basos: 1 %
EOS (ABSOLUTE): 0.2 10*3/uL (ref 0.0–0.4)
Eos: 2 %
Hematocrit: 41.8 % (ref 34.0–46.6)
Hemoglobin: 13.7 g/dL (ref 11.1–15.9)
Immature Grans (Abs): 0 10*3/uL (ref 0.0–0.1)
Immature Granulocytes: 0 %
Lymphocytes Absolute: 2.9 10*3/uL (ref 0.7–3.1)
Lymphs: 33 %
MCH: 29.6 pg (ref 26.6–33.0)
MCHC: 32.8 g/dL (ref 31.5–35.7)
MCV: 90 fL (ref 79–97)
Monocytes Absolute: 0.6 10*3/uL (ref 0.1–0.9)
Monocytes: 7 %
Neutrophils Absolute: 5 10*3/uL (ref 1.4–7.0)
Neutrophils: 57 %
Platelets: 304 10*3/uL (ref 150–450)
RBC: 4.63 x10E6/uL (ref 3.77–5.28)
RDW: 13.6 % (ref 11.7–15.4)
WBC: 8.8 10*3/uL (ref 3.4–10.8)

## 2022-07-03 LAB — CMP14+EGFR
ALT: 22 IU/L (ref 0–32)
AST: 15 IU/L (ref 0–40)
Albumin/Globulin Ratio: 1.9 (ref 1.2–2.2)
Albumin: 4.4 g/dL (ref 3.9–4.9)
Alkaline Phosphatase: 67 IU/L (ref 44–121)
BUN/Creatinine Ratio: 25 (ref 12–28)
BUN: 18 mg/dL (ref 8–27)
Bilirubin Total: 0.5 mg/dL (ref 0.0–1.2)
CO2: 23 mmol/L (ref 20–29)
Calcium: 9.5 mg/dL (ref 8.7–10.3)
Chloride: 104 mmol/L (ref 96–106)
Creatinine, Ser: 0.72 mg/dL (ref 0.57–1.00)
Globulin, Total: 2.3 g/dL (ref 1.5–4.5)
Glucose: 88 mg/dL (ref 70–99)
Potassium: 4.7 mmol/L (ref 3.5–5.2)
Sodium: 139 mmol/L (ref 134–144)
Total Protein: 6.7 g/dL (ref 6.0–8.5)
eGFR: 92 mL/min/{1.73_m2} (ref >59)

## 2022-07-03 LAB — LIPID PANEL
Chol/HDL Ratio: 2.9 ratio (ref 0.0–4.4)
Cholesterol, Total: 179 mg/dL (ref 100–199)
HDL: 62 mg/dL (ref >39)
LDL Chol Calc (NIH): 102 mg/dL — ABNORMAL HIGH (ref 0–99)
Triglycerides: 84 mg/dL (ref 0–149)
VLDL Cholesterol Cal: 15 mg/dL (ref 5–40)

## 2022-07-21 NOTE — Progress Notes (Unsigned)
Subjective:   Theresa Reyes is a 67 y.o. female who presents for an Initial Medicare Annual Wellness Visit.  Review of Systems    ***       Objective:    There were no vitals filed for this visit. There is no height or weight on file to calculate BMI.     01/02/2022   11:31 AM 11/28/2021   11:17 AM 11/20/2021    8:55 AM 07/15/2021   11:12 AM 03/02/2018    8:15 PM 10/25/2017    6:30 PM 08/13/2016    3:52 PM  Advanced Directives  Does Patient Have a Medical Advance Directive? No No No No  No No  Would patient like information on creating a medical advance directive? No - Patient declined No - Patient declined Yes (MAU/Ambulatory/Procedural Areas - Information given) No - Patient declined  No - Patient declined No - Patient declined     Information is confidential and restricted. Go to Review Flowsheets to unlock data.    Current Medications (verified) Outpatient Encounter Medications as of 07/22/2022  Medication Sig   amLODipine (NORVASC) 5 MG tablet Take 1 tablet (5 mg total) by mouth daily.   atorvastatin (LIPITOR) 10 MG tablet Take 1 tablet (10 mg total) by mouth at bedtime.   celecoxib (CELEBREX) 200 MG capsule Take 1 capsule (200 mg total) by mouth 2 (two) times daily.   escitalopram (LEXAPRO) 20 MG tablet Take 1 tablet (20 mg total) by mouth daily.   famotidine (PEPCID) 20 MG tablet Take 1 tablet (20 mg total) by mouth 2 (two) times daily.   hydrOXYzine (ATARAX) 10 MG tablet Take 1 tablet (10 mg total) by mouth 3 (three) times daily as needed.   ondansetron (ZOFRAN-ODT) 4 MG disintegrating tablet Take 1 tablet (4 mg total) by mouth every 8 (eight) hours as needed.   pantoprazole (PROTONIX) 40 MG tablet Take 1 tablet (40 mg total) by mouth daily.   Vitamin D, Ergocalciferol, (DRISDOL) 1.25 MG (50000 UNIT) CAPS capsule Take 1 capsule (50,000 Units total) by mouth every Thursday.   No facility-administered encounter medications on file as of 07/22/2022.    Allergies  (verified) Lisinopril   History: Past Medical History:  Diagnosis Date   Abdominal hernia    Anxiety    Arthritis    Cancer ([place]) 10/2021   cervical   Diverticulosis    GERD (gastroesophageal reflux disease)    History of hiatal hernia    History of kidney stones    age 18   Hypertension    Liver mass    S/P resection and partial hepatioma 2017 at [facility]   Ulcer of gastric fundus    Past Surgical History:  Procedure Laterality Date   CERVICAL CONIZATION W/BX N/A 12/03/2021   Procedure: CONIZATION CERVIX WITH BIOPSY; ENDOCERVICAL BIOPSY;  Surgeon: Lafonda Mosses, MD;  Location: [facility];  Service: Gynecology;  Laterality: N/A;   CHOLECYSTECTOMY  2017   COLONOSCOPY WITH ESOPHAGOGASTRODUODENOSCOPY (EGD)  08/2021   DILATATION & CURETTAGE/HYSTEROSCOPY WITH MYOSURE N/A 01/14/2022   Procedure: DILATATION & CURETTAGE;  Surgeon: Lafonda Mosses, MD;  Location: [facility];  Service: Gynecology;  Laterality: N/A;   ECTOPIC PREGNANCY SURGERY     x2, both tubes removed   OPEN PARTIAL HEPATECTOMY  Right 2017   right lobe benign tumor   ROBOTIC ASSISTED BILATERAL SALPINGO OOPHERECTOMY Bilateral 01/14/2022   Procedure: XI ROBOTIC ASSISTED BILATERAL SALPINGO OOPHORECTOMY;  Surgeon: Lafonda Mosses, MD;  Location: [facility];  Service:  Gynecology;  Laterality: Bilateral;   ROBOTIC ASSISTED TOTAL HYSTERECTOMY N/A 01/14/2022   Procedure: XI ROBOTIC ASSISTED TOTAL HYSTERECTOMY, STAGING, PERITONEAL BIOPSIES, OMENTECTOMY, LYSIS OF ADHESIONS, PELVIC WASHINGS;  Surgeon: Lafonda Mosses, MD;  Location: [facility];  Service: Gynecology;  Laterality: N/A;   Family History  Problem Relation Age of Onset   Hypertension Mother    Colon cancer Father 61       Deceased age 25 from colon ca   Social History   Socioeconomic History   Marital status: Married    Spouse name: Hoy Morn   Number of children: 1   Years of education: 14   Highest education level: Associate degree: academic program   Occupational History   Not on file  Tobacco Use   Smoking status: Every Day    Packs/day: 1.00    Years: 50.00    Total pack years: 50.00    Types: Cigarettes   Smokeless tobacco: Never  Vaping Use   Vaping Use: Never used  Substance and Sexual Activity   Alcohol use: Not Currently    Comment: rare   Drug use: Not Currently    Types: Marijuana   Sexual activity: Not Currently  Other Topics Concern   Not on file  Social History Narrative   Not on file   Social Determinants of Health   Financial Resource Strain: Medium Risk (07/15/2021)   Overall Financial Resource Strain (CARDIA)    Difficulty of Paying Living Expenses: Somewhat hard  Food Insecurity: No Food Insecurity (04/14/2022)   Hunger Vital Sign    Worried About Running Out of Food in the Last Year: Never true    Ran Out of Food in the Last Year: Never true  Transportation Needs: No Transportation Needs (04/14/2022)   PRAPARE - Hydrologist (Medical): No    Lack of Transportation (Non-Medical): No  Physical Activity: Inactive (07/15/2021)   Exercise Vital Sign    Days of Exercise per Week: 0 days    Minutes of Exercise per Session: 0 min  Stress: No Stress Concern Present (07/15/2021)   [place]    Feeling of Stress : Not at all  Social Connections: Moderately Isolated (07/15/2021)   Social Connection and Isolation Panel [NHANES]    Frequency of Communication with Friends and Family: More than three times a week    Frequency of Social Gatherings with Friends and Family: More than three times a week    Attends Religious Services: Never    Marine scientist or Organizations: No    Attends Archivist Meetings: Never    Marital Status: Married    Tobacco Counseling Ready to quit: Not Answered Counseling given: Not Answered   Clinical Intake:              How often do you need to have someone  help you when you read instructions, pamphlets, or other written materials from your doctor or pharmacy?: (P) 1 - Never  Diabetic?No         Activities of Daily Living    07/18/2022   10:31 AM 01/02/2022   11:33 AM  In your present state of health, do you have any difficulty performing the following activities:  Hearing? 0   Vision? 0   Difficulty concentrating or making decisions? 0   Walking or climbing stairs? 0   Dressing or bathing? 0   Doing errands, shopping? 0 0  Preparing Food  and eating ? N   Using the Toilet? N   In the past six months, have you accidently leaked urine? N   Do you have problems with loss of bowel control? N   Managing your Medications? N   Managing your Finances? N   Housekeeping or managing your Housekeeping? N     Patient Care Team: Chevis Pretty, FNP as PCP - General (Nurse Practitioner) Daneil Dolin, MD as Consulting Physician (Gastroenterology)  Indicate any recent Medical Services you may have received from other than [facility] providers in the past year (date may be approximate).     Assessment:   This is a routine wellness examination for Theresa Reyes.  Hearing/Vision screen No results found.  Dietary issues and exercise activities discussed:     Goals Addressed   None    Depression Screen    07/02/2022   11:32 AM 04/23/2022   11:31 AM 04/23/2022   11:30 AM 04/14/2022    3:27 PM 12/29/2021    2:29 PM 08/18/2021   12:22 PM 07/15/2021   11:13 AM  PHQ 2/9 Scores  PHQ - 2 Score 0 2 0 0 4 5 0  PHQ- 9 Score 0 5 0  '14 15 5    '$ Fall Risk    07/18/2022   10:31 AM 07/02/2022   11:32 AM 04/23/2022   11:30 AM 12/29/2021    2:29 PM 08/18/2021   12:22 PM  Fall Risk   Falls in the past year? 0 0 0 0 0  Number falls in past yr: 0      Injury with Fall? 0        FALL RISK PREVENTION PERTAINING TO THE HOME:  Any stairs in or around the home? {YES/NO:21197} If so, are there any without handrails? {YES/NO:21197} Home free of loose  throw rugs in walkways, pet beds, electrical cords, etc? {YES/NO:21197} Adequate lighting in your home to reduce risk of falls? {YES/NO:21197}  ASSISTIVE DEVICES UTILIZED TO PREVENT FALLS:  Life alert? {YES/NO:21197} Use of a cane, walker or w/c? {YES/NO:21197} Grab bars in the bathroom? {YES/NO:21197} Shower chair or bench in shower? {YES/NO:21197} Elevated toilet seat or a handicapped toilet? {YES/NO:21197}  TIMED UP AND GO:  Was the test performed? No . Telephonic visit   Cognitive Function:    07/15/2021   11:28 AM  MMSE - Mini Mental State Exam  Orientation to time 4  Orientation to Place 5  Registration 3  Attention/ Calculation 5  Recall 3  Language- name 2 objects 2  Language- repeat 1  Language- follow 3 step command 3  Language- read & follow direction 1  Write a sentence 1  Copy design 1  Total score 29        Immunizations Immunization History  Administered Date(s) Administered   Fluad Quad(high Dose 65+) 07/02/2022   Influenza Split 07/04/2015   Influenza,inj,Quad PF,6+ Mos 03/23/2018   Moderna Sars-Covid-2 Vaccination 08/08/2019, 09/05/2019, 05/17/2020   PPD Test 07/15/2017    TDAP status: Due, Education has been provided regarding the importance of this vaccine. Advised may receive this vaccine at local pharmacy or Health Dept. Aware to provide a copy of the vaccination record if obtained from local pharmacy or Health Dept. Verbalized acceptance and understanding.  Flu Vaccine status: Up to date  {Pneumococcal vaccine status:2101807}  Covid-19 vaccine status: Information provided on how to obtain vaccines.   Qualifies for Shingles Vaccine? Yes   Zostavax completed No   Shingrix Completed?: No.    Education has  been provided regarding the importance of this vaccine. Patient has been advised to call insurance company to determine out of pocket expense if they have not yet received this vaccine. Advised may also receive vaccine at local pharmacy or  Health Dept. Verbalized acceptance and understanding.  Screening Tests Health Maintenance  Topic Date Due   Hepatitis C Screening  Never done   DTaP/Tdap/Td (1 - Tdap) Never done   Lung Cancer Screening  10/26/2018   COVID-19 Vaccine (4 - 2023-24 season) 01/23/2022   Medicare Annual Wellness (AWV)  07/15/2022   Zoster Vaccines- Shingrix (1 of 2) 09/30/2022 (Originally 03/23/1975)   DEXA SCAN  04/29/2023 (Originally 03/22/2021)   Pneumonia Vaccine 67+ Years old (1 of 2 - PCV) 07/03/2023 (Originally 03/22/1962)   MAMMOGRAM  07/17/2023   COLONOSCOPY (Pts 45-50yr Insurance coverage will need to be confirmed)  09/30/2026   INFLUENZA VACCINE  Completed   HPV VACCINES  Aged Out    Health Maintenance  Health Maintenance Due  Topic Date Due   Hepatitis C Screening  Never done   DTaP/Tdap/Td (1 - Tdap) Never done   Lung Cancer Screening  10/26/2018   COVID-19 Vaccine (4 - 2023-24 season) 01/23/2022   Medicare Annual Wellness (AWV)  07/15/2022    Colorectal cancer screening: Type of screening: Colonoscopy. Completed 09/29/21. Repeat every 5 years  Mammogram status: Completed 07/16/21. Repeat every year  {Bone Density status:21018021}  Lung Cancer Screening: (Low Dose CT Chest recommended if Age 67-80years, 30 pack-year currently smoking OR have quit w/in 15years.) does qualify.   Lung Cancer Screening Referral: ***  Additional Screening:  Hepatitis C Screening: {DOES NOT does:27190::"does not"} qualify; Completed ***  Vision Screening: Recommended annual ophthalmology exams for early detection of glaucoma and other disorders of the eye. Is the patient up to date with their annual eye exam?  {YES/NO:21197} Who is the provider or what is the name of the office in which the patient attends annual eye exams? *** If pt is not established with a provider, would they like to be referred to a provider to establish care? {YES/NO:21197}.   Dental Screening: Recommended annual dental exams  for proper oral hygiene  Community Resource Referral / Chronic Care Management: CRR required this visit?  {YES/NO:21197}  CCM required this visit?  {YES/NO:21197}     Plan:     I have personally reviewed and noted the following in the patient's chart:   Medical and social history Use of alcohol, tobacco or illicit drugs  Current medications and supplements including opioid prescriptions. {Opioid Prescriptions:[phone]} Functional ability and status Nutritional status Physical activity Advanced directives List of other physicians Hospitalizations, surgeries, and ER visits in previous 12 months Vitals Screenings to include cognitive, depression, and falls Referrals and appointments  In addition, I have reviewed and discussed with patient certain preventive protocols, quality metrics, and best practice recommendations. A written personalized care plan for preventive services as well as general preventive health recommendations were provided to patient.     SVanetta Mulders [place]  [id number]  Due to this being a virtual visit, the after visit summary with patients personalized plan was offered to patient via mail or my-chart. ***Patient declined at this time./ Patient would like to access on my-chart/ per request, patient was mailed a copy of AVS./ Patient preferred to pick up at office at next visit   Nurse Notes: ***

## 2022-07-21 NOTE — Patient Instructions (Signed)
Theresa Reyes , Thank you for taking time to come for your Medicare Wellness Visit. I appreciate your ongoing commitment to your health goals. Please review the following plan we discussed and let me know if I can assist you in the future.   These are the goals we discussed:  Goals      DIET - INCREASE WATER INTAKE        This is a list of the screening recommended for you and due dates:  Health Maintenance  Topic Date Due   Hepatitis C Screening: USPSTF Recommendation to screen - Ages 85-79 yo.  Never done   DTaP/Tdap/Td vaccine (1 - Tdap) Never done   Screening for Lung Cancer  10/26/2018   COVID-19 Vaccine (4 - 2023-24 season) 01/23/2022   Medicare Annual Wellness Visit  07/15/2022   Zoster (Shingles) Vaccine (1 of 2) 09/30/2022*   DEXA scan (bone density measurement)  04/29/2023*   Pneumonia Vaccine (1 of 2 - PCV) 07/03/2023*   Mammogram  07/17/2023   Colon Cancer Screening  09/30/2026   Flu Shot  Completed   HPV Vaccine  Aged Out  *Topic was postponed. The date shown is not the original due date.    Advanced directives: ***  Conditions/risks identified: Aim for 30 minutes of exercise or brisk walking, 6-8 glasses of water, and 5 servings of fruits and vegetables each day.   Next appointment: Follow up in one year for your annual wellness visit    Preventive Care 65 Years and Older, Female Preventive care refers to lifestyle choices and visits with your health care provider that can promote health and wellness. What does preventive care include? A yearly physical exam. This is also called an annual well check. Dental exams once or twice a year. Routine eye exams. Ask your health care provider how often you should have your eyes checked. Personal lifestyle choices, including: Daily care of your teeth and gums. Regular physical activity. Eating a healthy diet. Avoiding tobacco and drug use. Limiting alcohol use. Practicing safe sex. Taking low-dose aspirin every  day. Taking vitamin and mineral supplements as recommended by your health care provider. What happens during an annual well check? The services and screenings done by your health care provider during your annual well check will depend on your age, overall health, lifestyle risk factors, and family history of disease. Counseling  Your health care provider may ask you questions about your: Alcohol use. Tobacco use. Drug use. Emotional well-being. Home and relationship well-being. Sexual activity. Eating habits. History of falls. Memory and ability to understand (cognition). Work and work Statistician. Reproductive health. Screening  You may have the following tests or measurements: Height, weight, and BMI. Blood pressure. Lipid and cholesterol levels. These may be checked every 5 years, or more frequently if you are over 32 years old. Skin check. Lung cancer screening. You may have this screening every year starting at age 34 if you have a 30-pack-year history of smoking and currently smoke or have quit within the past 15 years. Fecal occult blood test (FOBT) of the stool. You may have this test every year starting at age 38. Flexible sigmoidoscopy or colonoscopy. You may have a sigmoidoscopy every 5 years or a colonoscopy every 10 years starting at age 6. Hepatitis C blood test. Hepatitis B blood test. Sexually transmitted disease (STD) testing. Diabetes screening. This is done by checking your blood sugar (glucose) after you have not eaten for a while (fasting). You may have this done every 1-3  years. Bone density scan. This is done to screen for osteoporosis. You may have this done starting at age 72. Mammogram. This may be done every 1-2 years. Talk to your health care provider about how often you should have regular mammograms. Talk with your health care provider about your test results, treatment options, and if necessary, the need for more tests. Vaccines  Your health care  provider may recommend certain vaccines, such as: Influenza vaccine. This is recommended every year. Tetanus, diphtheria, and acellular pertussis (Tdap, Td) vaccine. You may need a Td booster every 10 years. Zoster vaccine. You may need this after age 27. Pneumococcal 13-valent conjugate (PCV13) vaccine. One dose is recommended after age 21. Pneumococcal polysaccharide (PPSV23) vaccine. One dose is recommended after age 75. Talk to your health care provider about which screenings and vaccines you need and how often you need them. This information is not intended to replace advice given to you by your health care provider. Make sure you discuss any questions you have with your health care provider. Document Released: 06/07/2015 Document Revised: 01/29/2016 Document Reviewed: 03/12/2015 Elsevier Interactive Patient Education  2017 [place] Prevention in the Home Falls can cause injuries. They can happen to people of all ages. There are many things you can do to make your home safe and to help prevent falls. What can I do on the outside of my home? Regularly fix the edges of walkways and driveways and fix any cracks. Remove anything that might make you trip as you walk through a door, such as a raised step or threshold. Trim any bushes or trees on the path to your home. Use bright outdoor lighting. Clear any walking paths of anything that might make someone trip, such as rocks or tools. Regularly check to see if handrails are loose or broken. Make sure that both sides of any steps have handrails. Any raised decks and porches should have guardrails on the edges. Have any leaves, snow, or ice cleared regularly. Use sand or salt on walking paths during winter. Clean up any spills in your garage right away. This includes oil or grease spills. What can I do in the bathroom? Use night lights. Install grab bars by the toilet and in the tub and shower. Do not use towel bars as grab  bars. Use non-skid mats or decals in the tub or shower. If you need to sit down in the shower, use a plastic, non-slip stool. Keep the floor dry. Clean up any water that spills on the floor as soon as it happens. Remove soap buildup in the tub or shower regularly. Attach bath mats securely with double-sided non-slip rug tape. Do not have throw rugs and other things on the floor that can make you trip. What can I do in the bedroom? Use night lights. Make sure that you have a light by your bed that is easy to reach. Do not use any sheets or blankets that are too big for your bed. They should not hang down onto the floor. Have a firm chair that has side arms. You can use this for support while you get dressed. Do not have throw rugs and other things on the floor that can make you trip. What can I do in the kitchen? Clean up any spills right away. Avoid walking on wet floors. Keep items that you use a lot in easy-to-reach places. If you need to reach something above you, use a strong step stool that has a grab  bar. Keep electrical cords out of the way. Do not use floor polish or wax that makes floors slippery. If you must use wax, use non-skid floor wax. Do not have throw rugs and other things on the floor that can make you trip. What can I do with my stairs? Do not leave any items on the stairs. Make sure that there are handrails on both sides of the stairs and use them. Fix handrails that are broken or loose. Make sure that handrails are as long as the stairways. Check any carpeting to make sure that it is firmly attached to the stairs. Fix any carpet that is loose or worn. Avoid having throw rugs at the top or bottom of the stairs. If you do have throw rugs, attach them to the floor with carpet tape. Make sure that you have a light switch at the top of the stairs and the bottom of the stairs. If you do not have them, ask someone to add them for you. What else can I do to help prevent  falls? Wear shoes that: Do not have high heels. Have rubber bottoms. Are comfortable and fit you well. Are closed at the toe. Do not wear sandals. If you use a stepladder: Make sure that it is fully opened. Do not climb a closed stepladder. Make sure that both sides of the stepladder are locked into place. Ask someone to hold it for you, if possible. Clearly mark and make sure that you can see: Any grab bars or handrails. First and last steps. Where the edge of each step is. Use tools that help you move around (mobility aids) if they are needed. These include: Canes. Walkers. Scooters. Crutches. Turn on the lights when you go into a dark area. Replace any light bulbs as soon as they burn out. Set up your furniture so you have a clear path. Avoid moving your furniture around. If any of your floors are uneven, fix them. If there are any pets around you, be aware of where they are. Review your medicines with your doctor. Some medicines can make you feel dizzy. This can increase your chance of falling. Ask your doctor what other things that you can do to help prevent falls. This information is not intended to replace advice given to you by your health care provider. Make sure you discuss any questions you have with your health care provider. Document Released: 03/07/2009 Document Revised: 10/17/2015 Document Reviewed: 06/15/2014 Elsevier Interactive Patient Education  2017 [employer].

## 2022-07-22 ENCOUNTER — Ambulatory Visit (INDEPENDENT_AMBULATORY_CARE_PROVIDER_SITE_OTHER): Payer: Medicare HMO

## 2022-07-22 VITALS — Ht 65.0 in | Wt 184.0 lb

## 2022-07-22 DIAGNOSIS — Z0000 Encounter for general adult medical examination without abnormal findings: Secondary | ICD-10-CM

## 2022-08-08 ENCOUNTER — Other Ambulatory Visit: Payer: Self-pay | Admitting: Nurse Practitioner

## 2022-08-08 DIAGNOSIS — I10 Essential (primary) hypertension: Secondary | ICD-10-CM

## 2022-09-09 ENCOUNTER — Other Ambulatory Visit: Payer: Self-pay | Admitting: Nurse Practitioner

## 2022-09-09 DIAGNOSIS — K2100 Gastro-esophageal reflux disease with esophagitis, without bleeding: Secondary | ICD-10-CM

## 2022-09-13 ENCOUNTER — Other Ambulatory Visit: Payer: Self-pay | Admitting: Nurse Practitioner

## 2022-09-13 DIAGNOSIS — K2100 Gastro-esophageal reflux disease with esophagitis, without bleeding: Secondary | ICD-10-CM

## 2022-09-29 ENCOUNTER — Encounter: Payer: Self-pay | Admitting: Gynecologic Oncology

## 2022-10-01 ENCOUNTER — Other Ambulatory Visit: Payer: Self-pay | Admitting: Nurse Practitioner

## 2022-10-01 DIAGNOSIS — I10 Essential (primary) hypertension: Secondary | ICD-10-CM

## 2022-10-21 ENCOUNTER — Other Ambulatory Visit: Payer: Self-pay | Admitting: Nurse Practitioner

## 2022-10-21 DIAGNOSIS — I10 Essential (primary) hypertension: Secondary | ICD-10-CM

## 2022-11-16 ENCOUNTER — Encounter: Payer: Self-pay | Admitting: *Deleted

## 2022-11-21 ENCOUNTER — Other Ambulatory Visit: Payer: Self-pay | Admitting: Nurse Practitioner

## 2022-11-21 DIAGNOSIS — K2100 Gastro-esophageal reflux disease with esophagitis, without bleeding: Secondary | ICD-10-CM

## 2022-12-10 DIAGNOSIS — K5731 Diverticulosis of large intestine without perforation or abscess with bleeding: Secondary | ICD-10-CM | POA: Diagnosis not present

## 2022-12-10 DIAGNOSIS — K2970 Gastritis, unspecified, without bleeding: Secondary | ICD-10-CM | POA: Diagnosis not present

## 2022-12-10 DIAGNOSIS — R109 Unspecified abdominal pain: Secondary | ICD-10-CM | POA: Diagnosis not present

## 2022-12-10 DIAGNOSIS — I454 Nonspecific intraventricular block: Secondary | ICD-10-CM | POA: Diagnosis not present

## 2022-12-10 DIAGNOSIS — R7989 Other specified abnormal findings of blood chemistry: Secondary | ICD-10-CM | POA: Diagnosis not present

## 2022-12-10 DIAGNOSIS — D72829 Elevated white blood cell count, unspecified: Secondary | ICD-10-CM | POA: Diagnosis not present

## 2022-12-10 DIAGNOSIS — Z9049 Acquired absence of other specified parts of digestive tract: Secondary | ICD-10-CM | POA: Diagnosis not present

## 2022-12-10 DIAGNOSIS — I10 Essential (primary) hypertension: Secondary | ICD-10-CM | POA: Diagnosis not present

## 2022-12-10 DIAGNOSIS — I4519 Other right bundle-branch block: Secondary | ICD-10-CM | POA: Diagnosis not present

## 2022-12-10 DIAGNOSIS — I4510 Unspecified right bundle-branch block: Secondary | ICD-10-CM | POA: Diagnosis not present

## 2022-12-10 DIAGNOSIS — I214 Non-ST elevation (NSTEMI) myocardial infarction: Secondary | ICD-10-CM | POA: Diagnosis not present

## 2022-12-10 DIAGNOSIS — R10811 Right upper quadrant abdominal tenderness: Secondary | ICD-10-CM | POA: Diagnosis not present

## 2022-12-10 DIAGNOSIS — K5730 Diverticulosis of large intestine without perforation or abscess without bleeding: Secondary | ICD-10-CM | POA: Diagnosis not present

## 2022-12-10 DIAGNOSIS — I700 Atherosclerosis of aorta: Secondary | ICD-10-CM | POA: Diagnosis not present

## 2022-12-10 DIAGNOSIS — R112 Nausea with vomiting, unspecified: Secondary | ICD-10-CM | POA: Diagnosis not present

## 2022-12-10 DIAGNOSIS — R1012 Left upper quadrant pain: Secondary | ICD-10-CM | POA: Diagnosis not present

## 2022-12-10 DIAGNOSIS — R6883 Chills (without fever): Secondary | ICD-10-CM | POA: Diagnosis not present

## 2022-12-10 DIAGNOSIS — R10816 Epigastric abdominal tenderness: Secondary | ICD-10-CM | POA: Diagnosis not present

## 2022-12-10 DIAGNOSIS — R1084 Generalized abdominal pain: Secondary | ICD-10-CM | POA: Diagnosis not present

## 2022-12-10 DIAGNOSIS — R739 Hyperglycemia, unspecified: Secondary | ICD-10-CM | POA: Diagnosis not present

## 2022-12-10 DIAGNOSIS — R10812 Left upper quadrant abdominal tenderness: Secondary | ICD-10-CM | POA: Diagnosis not present

## 2022-12-12 ENCOUNTER — Encounter (HOSPITAL_COMMUNITY): Payer: Self-pay

## 2022-12-12 ENCOUNTER — Inpatient Hospital Stay: Admit: 2022-12-12 | Payer: Medicare HMO | Admitting: Internal Medicine

## 2022-12-12 DIAGNOSIS — F411 Generalized anxiety disorder: Secondary | ICD-10-CM | POA: Diagnosis not present

## 2022-12-12 DIAGNOSIS — F419 Anxiety disorder, unspecified: Secondary | ICD-10-CM | POA: Diagnosis not present

## 2022-12-12 DIAGNOSIS — K5731 Diverticulosis of large intestine without perforation or abscess with bleeding: Secondary | ICD-10-CM | POA: Diagnosis not present

## 2022-12-12 DIAGNOSIS — N179 Acute kidney failure, unspecified: Secondary | ICD-10-CM | POA: Diagnosis not present

## 2022-12-12 DIAGNOSIS — R10816 Epigastric abdominal tenderness: Secondary | ICD-10-CM | POA: Diagnosis not present

## 2022-12-12 DIAGNOSIS — R10811 Right upper quadrant abdominal tenderness: Secondary | ICD-10-CM | POA: Diagnosis not present

## 2022-12-12 DIAGNOSIS — R10812 Left upper quadrant abdominal tenderness: Secondary | ICD-10-CM | POA: Diagnosis not present

## 2022-12-12 DIAGNOSIS — I4519 Other right bundle-branch block: Secondary | ICD-10-CM | POA: Diagnosis not present

## 2022-12-12 DIAGNOSIS — E782 Mixed hyperlipidemia: Secondary | ICD-10-CM | POA: Diagnosis not present

## 2022-12-12 DIAGNOSIS — R7989 Other specified abnormal findings of blood chemistry: Secondary | ICD-10-CM | POA: Diagnosis not present

## 2022-12-12 DIAGNOSIS — I214 Non-ST elevation (NSTEMI) myocardial infarction: Secondary | ICD-10-CM | POA: Diagnosis not present

## 2022-12-12 DIAGNOSIS — F32A Depression, unspecified: Secondary | ICD-10-CM | POA: Diagnosis not present

## 2022-12-12 DIAGNOSIS — E861 Hypovolemia: Secondary | ICD-10-CM | POA: Diagnosis not present

## 2022-12-12 DIAGNOSIS — I517 Cardiomegaly: Secondary | ICD-10-CM | POA: Diagnosis not present

## 2022-12-12 DIAGNOSIS — Q245 Malformation of coronary vessels: Secondary | ICD-10-CM | POA: Diagnosis not present

## 2022-12-12 DIAGNOSIS — I10 Essential (primary) hypertension: Secondary | ICD-10-CM | POA: Diagnosis not present

## 2022-12-12 DIAGNOSIS — K219 Gastro-esophageal reflux disease without esophagitis: Secondary | ICD-10-CM | POA: Diagnosis not present

## 2022-12-12 DIAGNOSIS — I454 Nonspecific intraventricular block: Secondary | ICD-10-CM | POA: Diagnosis not present

## 2022-12-12 DIAGNOSIS — R079 Chest pain, unspecified: Secondary | ICD-10-CM | POA: Diagnosis not present

## 2022-12-12 DIAGNOSIS — F17210 Nicotine dependence, cigarettes, uncomplicated: Secondary | ICD-10-CM | POA: Diagnosis not present

## 2022-12-12 DIAGNOSIS — I700 Atherosclerosis of aorta: Secondary | ICD-10-CM | POA: Diagnosis not present

## 2022-12-12 DIAGNOSIS — E785 Hyperlipidemia, unspecified: Secondary | ICD-10-CM | POA: Diagnosis not present

## 2022-12-13 ENCOUNTER — Other Ambulatory Visit: Payer: Self-pay | Admitting: Nurse Practitioner

## 2022-12-13 DIAGNOSIS — I10 Essential (primary) hypertension: Secondary | ICD-10-CM

## 2022-12-16 ENCOUNTER — Telehealth: Payer: Self-pay

## 2022-12-16 NOTE — Transitions of Care (Post Inpatient/ED Visit) (Signed)
   12/16/2022  Name: Theresa Reyes MRN: [id number] DOB: 10/19/1955  Today's TOC FU Call Status: Today's TOC FU Call Status:: Unsuccessul Call (1st Attempt) Unsuccessful Call (1st Attempt) Date: 12/16/22  Attempted to reach the patient regarding the most recent Inpatient/ED visit.  Follow Up Plan: No further outreach attempts will be made at this time. We have been unable to contact the patient.  Jodelle Gross, RN, BSN, CCM Care Management Coordinator [place] Phone: [phone]/Fax: [phone]

## 2022-12-17 ENCOUNTER — Telehealth: Payer: Self-pay

## 2022-12-17 ENCOUNTER — Telehealth: Payer: Self-pay | Admitting: Nurse Practitioner

## 2022-12-17 NOTE — Transitions of Care (Post Inpatient/ED Visit) (Signed)
   12/17/2022  Name: Theresa Reyes MRN: [id number] DOB: Oct 20, 1955  Today's TOC FU Call Status: Today's TOC FU Call Status:: Unsuccessful Call (2nd Attempt) Unsuccessful Call (2nd Attempt) Date: 12/17/22  Attempted to reach the patient regarding the most recent Inpatient/ED visit.  Follow Up Plan: Additional outreach attempts will be made to reach the patient to complete the Transitions of Care (Post Inpatient/ED visit) call.   Jodelle Gross, RN, BSN, CCM Care Management Coordinator [place] Phone: [phone]/Fax: [phone]

## 2022-12-17 NOTE — Telephone Encounter (Signed)
Appt moved to a later time on same day to accommodate for 83m  8/13 at 12:15

## 2022-12-18 ENCOUNTER — Telehealth: Payer: Self-pay

## 2022-12-18 NOTE — Transitions of Care (Post Inpatient/ED Visit) (Signed)
12/18/2022  Name: Theresa Reyes MRN: [id number] DOB: February 03, 1956  Today's TOC FU Call Status: Today's TOC FU Call Status:: Successful TOC FU Call Competed TOC FU Call Complete Date: 12/18/22  Transition Care Management Follow-up Telephone Call Date of Discharge: 12/14/22 Discharge Facility: Other ([facility]) Name of Other ([facility]) Discharge Facility: [facility] Type of Discharge: Inpatient Admission Primary Inpatient Discharge Diagnosis:: Chest Pain How have you been since you were released from the hospital?: Better Any questions or concerns?: Yes Patient Questions/Concerns:: Patient feels some of her chest pain is related to anxiety and panic attacks.  She was prescribed Atarax which does not seem to help much. Patient agreed to a LCSW call her and this Clinical research associate to schedule. Patient Questions/Concerns Addressed: Other: (schedule with LCSW)  Items Reviewed: Did you receive and understand the discharge instructions provided?: Yes Medications obtained,verified, and reconciled?: Yes (Medications Reviewed) Any new allergies since your discharge?: No Dietary orders reviewed?: Yes Type of Diet Ordered:: Regular, 2gr sodium restriction Do you have support at home?: Yes People in Home: spouse Name of Support/Comfort Primary Source: Theresa Reyes  Medications Reviewed Today: Medications Reviewed Today     Reviewed by Jodelle Gross, RN (Case Manager) on 12/18/22 at 1040  Med List Status: <None>   Medication Order Taking? Sig Documenting Provider Last Dose Status Informant  amLODipine (NORVASC) 5 MG tablet [id number] Yes TAKE 1 TABLET EVERY DAY Daphine Deutscher, Mary-Margaret, FNP Taking Active   atorvastatin (LIPITOR) 10 MG tablet [id number] Yes TAKE 1 TABLET AT BEDTIME Daphine Deutscher, Mary-Margaret, FNP Taking Active   celecoxib (CELEBREX) 200 MG capsule [id number] Yes Take 1 capsule (200 mg total) by mouth 2 (two) times daily. Daphine Deutscher, Mary-Margaret, FNP Taking Active   escitalopram (LEXAPRO) 20 MG  tablet [id number] Yes Take 1 tablet (20 mg total) by mouth daily. Daphine Deutscher, Mary-Margaret, FNP Taking Active   famotidine (PEPCID) 20 MG tablet [id number] Yes TAKE 1 TABLET TWICE DAILY Daphine Deutscher, Mary-Margaret, FNP Taking Active   hydrOXYzine (ATARAX) 10 MG tablet [id number] Yes Take 1 tablet (10 mg total) by mouth 3 (three) times daily as needed. Daphine Deutscher Mary-Margaret, FNP Taking Active Self  ondansetron (ZOFRAN-ODT) 4 MG disintegrating tablet [id number]  Take 1 tablet (4 mg total) by mouth every 8 (eight) hours as needed. Daphine Deutscher, Mary-Margaret, FNP  Active Self  pantoprazole (PROTONIX) 40 MG tablet [id number] Yes TAKE 1 TABLET EVERY DAY Daphine Deutscher, Mary-Margaret, FNP Taking Active   Vitamin D, Ergocalciferol, (DRISDOL) 1.25 MG (50000 UNIT) CAPS capsule [id number]  Take 1 capsule (50,000 Units total) by mouth every Thursday. Bennie Pierini, FNP  Active             Home Care and Equipment/Supplies: Were Home Health Services Ordered?: No Any new equipment or medical supplies ordered?: No  Functional Questionnaire: Do you need assistance with bathing/showering or dressing?: No Do you need assistance with meal preparation?: No Do you need assistance with eating?: No Do you have difficulty maintaining continence: No Do you need assistance with getting out of bed/getting out of a chair/moving?: No Do you have difficulty managing or taking your medications?: No  Follow up appointments reviewed: PCP Follow-up appointment confirmed?: Yes Date of PCP follow-up appointment?: 01/05/23 Follow-up Provider: [facility] Follow-up appointment confirmed?: NA Do you need transportation to your follow-up appointment?: No Do you understand care options if your condition(s) worsen?: Yes-patient verbalized understanding  SDOH Interventions Today    Flowsheet Row Most Recent Value  SDOH Interventions   Food Insecurity Interventions Intervention Not Indicated  Housing Interventions  Intervention Not Indicated      TOC Interventions Today    Flowsheet Row Most Recent Value  TOC Interventions   TOC Interventions Discussed/Reviewed TOC Interventions Reviewed, TOC Interventions Discussed       Interventions Today    Flowsheet Row Most Recent Value  Mental Health Interventions   Mental Health Discussed/Reviewed Refer to Social Work for counseling  Refer to Social Work for counseling regarding Anxiety/Coping      Jodelle Gross, RN, BSN, CCM Care Management Coordinator [place] Phone: [phone]/Fax: [phone]

## 2022-12-25 ENCOUNTER — Telehealth: Payer: Self-pay | Admitting: Licensed Clinical Social Worker

## 2022-12-25 ENCOUNTER — Encounter: Payer: Self-pay | Admitting: Licensed Clinical Social Worker

## 2022-12-28 NOTE — Patient Outreach (Signed)
  Care Coordination   12/25/2022 Name: Tekla Matousek MRN: [id number] DOB: Sep 05, 1955   Care Coordination Outreach Attempts:  An unsuccessful telephone outreach was attempted for a scheduled appointment today.  Follow Up Plan:  Additional outreach attempts will be made to offer the patient care coordination information and services.   Encounter Outcome:  No Answer   Care Coordination Interventions:  No, not indicated    Jenel Lucks, MSW, LCSW [facility] Care Management [place]  [facility] [email or web address] Phone [phone] 3:42 PM

## 2022-12-31 ENCOUNTER — Ambulatory Visit: Payer: Medicare HMO | Admitting: Nurse Practitioner

## 2023-01-02 ENCOUNTER — Other Ambulatory Visit: Payer: Self-pay | Admitting: Nurse Practitioner

## 2023-01-02 DIAGNOSIS — I10 Essential (primary) hypertension: Secondary | ICD-10-CM

## 2023-01-02 DIAGNOSIS — F3341 Major depressive disorder, recurrent, in partial remission: Secondary | ICD-10-CM

## 2023-01-05 ENCOUNTER — Ambulatory Visit: Payer: Medicare HMO | Admitting: Nurse Practitioner

## 2023-01-05 ENCOUNTER — Encounter: Payer: Self-pay | Admitting: Nurse Practitioner

## 2023-01-05 VITALS — BP 129/80 | HR 72 | Temp 96.8°F | Resp 20 | Ht 65.0 in | Wt 174.0 lb

## 2023-01-05 DIAGNOSIS — I214 Non-ST elevation (NSTEMI) myocardial infarction: Secondary | ICD-10-CM

## 2023-01-05 DIAGNOSIS — K5732 Diverticulitis of large intestine without perforation or abscess without bleeding: Secondary | ICD-10-CM | POA: Diagnosis not present

## 2023-01-05 DIAGNOSIS — E876 Hypokalemia: Secondary | ICD-10-CM | POA: Diagnosis not present

## 2023-01-05 DIAGNOSIS — F419 Anxiety disorder, unspecified: Secondary | ICD-10-CM

## 2023-01-05 DIAGNOSIS — I10 Essential (primary) hypertension: Secondary | ICD-10-CM

## 2023-01-05 DIAGNOSIS — K2100 Gastro-esophageal reflux disease with esophagitis, without bleeding: Secondary | ICD-10-CM

## 2023-01-05 DIAGNOSIS — F3341 Major depressive disorder, recurrent, in partial remission: Secondary | ICD-10-CM | POA: Diagnosis not present

## 2023-01-05 DIAGNOSIS — Z6828 Body mass index (BMI) 28.0-28.9, adult: Secondary | ICD-10-CM | POA: Diagnosis not present

## 2023-01-05 MED ORDER — ATORVASTATIN CALCIUM 10 MG PO TABS
10.0000 mg | ORAL_TABLET | Freq: Every day | ORAL | 1 refills | Status: DC
Start: 2023-01-05 — End: 2023-06-28

## 2023-01-05 MED ORDER — PANTOPRAZOLE SODIUM 40 MG PO TBEC
40.0000 mg | DELAYED_RELEASE_TABLET | Freq: Every day | ORAL | 1 refills | Status: DC
Start: 2023-01-05 — End: 2023-06-28

## 2023-01-05 MED ORDER — ESCITALOPRAM OXALATE 20 MG PO TABS
20.0000 mg | ORAL_TABLET | Freq: Every day | ORAL | 1 refills | Status: DC
Start: 2023-01-05 — End: 2023-06-28

## 2023-01-05 MED ORDER — AMLODIPINE BESYLATE 5 MG PO TABS
5.0000 mg | ORAL_TABLET | Freq: Every day | ORAL | 1 refills | Status: DC
Start: 2023-01-05 — End: 2023-06-28

## 2023-01-05 MED ORDER — BUPROPION HCL ER (XL) 150 MG PO TB24: 150.0000 mg | ORAL_TABLET | Freq: Every day | ORAL | 1 refills | Status: DC

## 2023-01-05 MED ORDER — LORAZEPAM 0.5 MG PO TABS
0.5000 mg | ORAL_TABLET | Freq: Every day | ORAL | 5 refills | Status: DC | PRN
Start: 2023-01-05 — End: 2023-06-28

## 2023-01-05 NOTE — Progress Notes (Signed)
Subjective:    Patient ID: Theresa Reyes, female    DOB: [birth year], 67 y.o.   MRN: [id number]   Chief Complaint: Hospitalization Follow-up (Having anxiety issues with crying episodes/)    HPI:  Theresa Reyes is a 67 y.o. who identifies as a female who was assigned female at birth.   Social history: Lives with: husband and son Work history: retired   Water engineer in today for follow up of the following chronic medical issues:  1. Essential hypertension No c/o chest pain, sob or headache. Does not check blood pressure at home. BP Readings from Last 3 Encounters:  01/05/23 129/80  07/02/22 135/73  04/23/22 131/80     2. Diverticulitis large intestine w/o perforation or abscess w/o bleeding No recent flare ups. Tries to avoid foods that cause flare up.  3. Gastroesophageal reflux disease with esophagitis without hemorrhage Is on protonix and is doing well.  4. Anxiety Anxiety has increased the last several months. Ataraxis not helping. Has ben having panic attacks 3-4 x a week, for no reason.    01/05/2023   12:40 PM 07/02/2022   11:33 AM 04/23/2022   11:30 AM 12/29/2021    2:29 PM  GAD 7 : Generalized Anxiety Score  Nervous, Anxious, on Edge 2 0 1 3  Control/stop worrying 2 0 1 1  Worry too much - different things 2 0 1 1  Trouble relaxing 2 0 1 3  Restless 1 0 1 2  Easily annoyed or irritable 2 0 1 3  Afraid - awful might happen 2 0 1 0  Total GAD 7 Score 13 0 7 13  Anxiety Difficulty Somewhat difficult Not difficult at all Somewhat difficult Somewhat difficult      5. Recurrent major depressive disorder, in partial remission (HCC) Is on lexapro and feels that she needs something added    01/05/2023   12:39 PM 07/22/2022   10:53 AM 07/02/2022   11:32 AM  Depression screen PHQ 2/9  Decreased Interest 2 0 0  Down, Depressed, Hopeless 1 0 0  PHQ - 2 Score 3 0 0  Altered sleeping 0  0  Tired, decreased energy 2  0  Change in appetite 2  0  Feeling bad or failure  about yourself  0  0  Trouble concentrating 2  0  Moving slowly or fidgety/restless 0  0  Suicidal thoughts 0  0  PHQ-9 Score 9  0  Difficult doing work/chores Somewhat difficult  Not difficult at all     6. Hypokalemia Labs pending  7. Non-ST elevation MI (NSTEMI) (HCC) See hospital note below  8. BMI 28.0-28.9,adult No recent weight changes Wt Readings from Last 3 Encounters:  01/05/23 174 lb (78.9 kg)  07/22/22 184 lb (83.5 kg)  07/02/22 184 lb (83.5 kg)   BMI Readings from Last 3 Encounters:  01/05/23 28.96 kg/m  07/22/22 30.62 kg/m  07/02/22 30.62 kg/m      New complaints: Hospital follow up- patient went to the ed on 12/13/22 with chest pain. Had emergency cath the next day- all arteries were clear. Patient has no residual effect and is doing well. Denies chest pain or SOB.  Allergies  Allergen Reactions   Lisinopril     Vertigo   Outpatient Encounter Medications as of 01/05/2023  Medication Sig   buPROPion (WELLBUTRIN XL) 150 MG 24 hr tablet Take 1 tablet (150 mg total) by mouth daily.   famotidine (PEPCID) 20 MG tablet TAKE 1 TABLET TWICE DAILY  LORazepam (ATIVAN) 0.5 MG tablet Take 1 tablet (0.5 mg total) by mouth daily as needed for anxiety.   [DISCONTINUED] amLODipine (NORVASC) 5 MG tablet TAKE 1 TABLET EVERY DAY   [DISCONTINUED] atorvastatin (LIPITOR) 10 MG tablet TAKE 1 TABLET AT BEDTIME   [DISCONTINUED] escitalopram (LEXAPRO) 20 MG tablet TAKE 1 TABLET EVERY DAY   [DISCONTINUED] hydrOXYzine (ATARAX) 10 MG tablet Take 1 tablet (10 mg total) by mouth 3 (three) times daily as needed.   [DISCONTINUED] pantoprazole (PROTONIX) 40 MG tablet TAKE 1 TABLET EVERY DAY   amLODipine (NORVASC) 5 MG tablet Take 1 tablet (5 mg total) by mouth daily.   atorvastatin (LIPITOR) 10 MG tablet Take 1 tablet (10 mg total) by mouth at bedtime.   escitalopram (LEXAPRO) 20 MG tablet Take 1 tablet (20 mg total) by mouth daily.   pantoprazole (PROTONIX) 40 MG tablet Take 1  tablet (40 mg total) by mouth daily.   [DISCONTINUED] amLODipine (NORVASC) 5 MG tablet TAKE 1 TABLET EVERY DAY   [DISCONTINUED] celecoxib (CELEBREX) 200 MG capsule Take 1 capsule (200 mg total) by mouth 2 (two) times daily.   [DISCONTINUED] escitalopram (LEXAPRO) 20 MG tablet Take 1 tablet (20 mg total) by mouth daily.   [DISCONTINUED] ondansetron (ZOFRAN-ODT) 4 MG disintegrating tablet Take 1 tablet (4 mg total) by mouth every 8 (eight) hours as needed.   [DISCONTINUED] Vitamin D, Ergocalciferol, (DRISDOL) 1.25 MG (50000 UNIT) CAPS capsule Take 1 capsule (50,000 Units total) by mouth every Thursday.   No facility-administered encounter medications on file as of 01/05/2023.    Past Surgical History:  Procedure Laterality Date   ABDOMINAL HYSTERECTOMY     CERVICAL CONIZATION W/BX N/A 12/03/2021   Procedure: CONIZATION CERVIX WITH BIOPSY; ENDOCERVICAL BIOPSY;  Surgeon: Carver Fila, MD;  Location: [facility];  Service: Gynecology;  Laterality: N/A;   CESAREAN SECTION     CHOLECYSTECTOMY  2017   COLONOSCOPY WITH ESOPHAGOGASTRODUODENOSCOPY (EGD)  08/2021   DILATATION & CURETTAGE/HYSTEROSCOPY WITH MYOSURE N/A 01/14/2022   Procedure: DILATATION & CURETTAGE;  Surgeon: Carver Fila, MD;  Location: [facility];  Service: Gynecology;  Laterality: N/A;   ECTOPIC PREGNANCY SURGERY     x2, both tubes removed   EYE SURGERY     OPEN PARTIAL HEPATECTOMY  Right 2017   right lobe benign tumor   ROBOTIC ASSISTED BILATERAL SALPINGO OOPHERECTOMY Bilateral 01/14/2022   Procedure: XI ROBOTIC ASSISTED BILATERAL SALPINGO OOPHORECTOMY;  Surgeon: Carver Fila, MD;  Location: [facility];  Service: Gynecology;  Laterality: Bilateral;   ROBOTIC ASSISTED TOTAL HYSTERECTOMY N/A 01/14/2022   Procedure: XI ROBOTIC ASSISTED TOTAL HYSTERECTOMY, STAGING, PERITONEAL BIOPSIES, OMENTECTOMY, LYSIS OF ADHESIONS, PELVIC WASHINGS;  Surgeon: Carver Fila, MD;  Location: [facility];  Service: Gynecology;  Laterality: N/A;     Family History  Problem Relation Age of Onset   Hypertension Mother    Colon cancer Father 49       Deceased age 30 from colon ca   Cancer Father       Controlled substance contract: n/a     Review of Systems  Constitutional:  Negative for diaphoresis.  Eyes:  Negative for pain.  Respiratory:  Negative for shortness of breath.   Cardiovascular:  Negative for chest pain, palpitations and leg swelling.  Gastrointestinal:  Negative for abdominal pain.  Endocrine: Negative for polydipsia.  Skin:  Negative for rash.  Neurological:  Negative for dizziness, weakness and headaches.  Hematological:  Does not bruise/bleed easily.  All other systems reviewed and are  negative.      Objective:   Physical Exam Vitals and nursing note reviewed.  Constitutional:      General: She is not in acute distress.    Appearance: Normal appearance. She is well-developed.  HENT:     Head: Normocephalic.     Right Ear: Tympanic membrane normal.     Left Ear: Tympanic membrane normal.     Nose: Nose normal.     Mouth/Throat:     Mouth: Mucous membranes are moist.  Eyes:     Pupils: Pupils are equal, round, and reactive to light.  Neck:     Vascular: No carotid bruit or JVD.  Cardiovascular:     Rate and Rhythm: Normal rate and regular rhythm.     Heart sounds: Normal heart sounds.  Pulmonary:     Effort: Pulmonary effort is normal. No respiratory distress.     Breath sounds: Normal breath sounds. No wheezing or rales.  Chest:     Chest wall: No tenderness.  Abdominal:     General: Bowel sounds are normal. There is no distension or abdominal bruit.     Palpations: Abdomen is soft. There is no hepatomegaly, splenomegaly, mass or pulsatile mass.     Tenderness: There is no abdominal tenderness.  Musculoskeletal:        General: Normal range of motion.     Cervical back: Normal range of motion and neck supple.  Lymphadenopathy:     Cervical: No cervical adenopathy.  Skin:     General: Skin is warm and dry.  Neurological:     Mental Status: She is alert and oriented to person, place, and time.     Deep Tendon Reflexes: Reflexes are normal and symmetric.  Psychiatric:        Behavior: Behavior normal.        Thought Content: Thought content normal.        Judgment: Judgment normal.    BP 129/80   Pulse 72   Temp (!) 96.8 F (36 C) (Temporal)   Resp 20   Ht 5\' 5"  (1.651 m)   Wt 174 lb (78.9 kg)   SpO2 98%   BMI 28.96 kg/m         Assessment & Plan:   Theresa Reyes comes in today with chief complaint of Hospitalization Follow-up (Having anxiety issues with crying episodes/)   Diagnosis and orders addressed:  1. Essential hypertension Low sodium diet - amLODipine (NORVASC) 5 MG tablet; Take 1 tablet (5 mg total) by mouth daily.  Dispense: 90 tablet; Refill: 1 - atorvastatin (LIPITOR) 10 MG tablet; Take 1 tablet (10 mg total) by mouth at bedtime.  Dispense: 90 tablet; Refill: 1 - CBC with Differential/Platelet - CMP14+EGFR - Lipid panel  2. Diverticulitis large intestine w/o perforation or abscess w/o bleeding Watch diet to prevent flare up  3. Gastroesophageal reflux disease with esophagitis without hemorrhage Avoid spicy foods Do not eat 2 hours prior to bedtime  - pantoprazole (PROTONIX) 40 MG tablet; Take 1 tablet (40 mg total) by mouth daily.  Dispense: 90 tablet; Refill: 1  4. Anxiety Stress management Stop atarax Added ativan - LORazepam (ATIVAN) 0.5 MG tablet; Take 1 tablet (0.5 mg total) by mouth daily as needed for anxiety.  Dispense: 30 tablet; Refill: 5  5. Recurrent major depressive disorder, in partial remission (HCC) Added wellbutrin to lexapro - buPROPion (WELLBUTRIN XL) 150 MG 24 hr tablet; Take 1 tablet (150 mg total) by mouth daily.  Dispense: 90 tablet;  Refill: 1 - escitalopram (LEXAPRO) 20 MG tablet; Take 1 tablet (20 mg total) by mouth daily.  Dispense: 90 tablet; Refill: 1  6. Hypokalemia Labs pending  7.  Non-ST elevation MI (NSTEMI) (HCC) - Ambulatory referral to Cardiology  8. BMI 28.0-28.9,adult Discussed diet and exercise for person with BMI >25 Will recheck weight in 3-6 months    Labs pending Health Maintenance reviewed Diet and exercise encouraged  Follow up plan: 6 months   Mary-Margaret Daphine Deutscher, FNP

## 2023-02-04 ENCOUNTER — Other Ambulatory Visit: Payer: Self-pay | Admitting: Nurse Practitioner

## 2023-02-04 DIAGNOSIS — K2100 Gastro-esophageal reflux disease with esophagitis, without bleeding: Secondary | ICD-10-CM

## 2023-04-11 ENCOUNTER — Encounter: Payer: Self-pay | Admitting: Cardiology

## 2023-04-11 NOTE — Progress Notes (Deleted)
Cardiology Office Note   Date:  04/11/2023   ID:  Theresa Reyes, DOB September 21, 1955, MRN [id number]  PCP:  Bennie Pierini, FNP  Cardiologist:   None Referring:  ***  No chief complaint on file.     History of Present Illness: Theresa Reyes is a 68 y.o. female who presents for evaluation of ***.  She was in the hospital at [facility] earlier this year with NSTEMI.  I reviewed these records for this visit.        She had a dagnostic cath at [facility] in November 2024.     FINDINGS: 1. Left Main: No significant disease. 2. LAD: Free of disease in its proximal mid and distal extent.  Diagonal vessels without disease. 3. Circumflex: Codominant system.  Circumflex proper and left PDA without significant disease.  Ramus vessel without disease.   4. Right Coronary Artery: Dominant vessel.  Right coronary artery, right PDA and PLV branches free of disease.   5. Hemodynamics: Aortic pressure 127/64 with a mean of 71.  Left ventricular pressure 146 with an EDP of 18       Past Medical History:  Diagnosis Date   Abdominal hernia    Anxiety    Arthritis    Cancer (HCC) 10/2021   cervical   Depression    Diverticulosis    GERD (gastroesophageal reflux disease)    Heart murmur    History of hiatal hernia    History of kidney stones    age 27   Hypertension    Liver mass    S/P resection and partial hepatioma 2017 at [facility]   Substance abuse (HCC)    Ulcer of gastric fundus     Past Surgical History:  Procedure Laterality Date   ABDOMINAL HYSTERECTOMY     CERVICAL CONIZATION W/BX N/A 12/03/2021   Procedure: CONIZATION CERVIX WITH BIOPSY; ENDOCERVICAL BIOPSY;  Surgeon: Carver Fila, MD;  [facility];  Service: Gynecology;  Laterality: N/A;   CESAREAN SECTION     CHOLECYSTECTOMY  2017   COLONOSCOPY WITH ESOPHAGOGASTRODUODENOSCOPY (EGD)  08/2021   DILATATION & CURETTAGE/HYSTEROSCOPY WITH MYOSURE N/A 01/14/2022   Procedure: DILATATION & CURETTAGE;   Surgeon: Carver Fila, MD;  [facility];  Service: Gynecology;  Laterality: N/A;   ECTOPIC PREGNANCY SURGERY     x2, both tubes removed   EYE SURGERY     OPEN PARTIAL HEPATECTOMY  Right 2017   right lobe benign tumor   ROBOTIC ASSISTED BILATERAL SALPINGO OOPHERECTOMY Bilateral 01/14/2022   Procedure: XI ROBOTIC ASSISTED BILATERAL SALPINGO OOPHORECTOMY;  Surgeon: Carver Fila, MD;  [facility];  Service: Gynecology;  Laterality: Bilateral;   ROBOTIC ASSISTED TOTAL HYSTERECTOMY N/A 01/14/2022   Procedure: XI ROBOTIC ASSISTED TOTAL HYSTERECTOMY, STAGING, PERITONEAL BIOPSIES, OMENTECTOMY, LYSIS OF ADHESIONS, PELVIC WASHINGS;  Surgeon: Carver Fila, MD;  [facility];  Service: Gynecology;  Laterality: N/A;     Current Outpatient Medications  Medication Sig Dispense Refill   amLODipine (NORVASC) 5 MG tablet Take 1 tablet (5 mg total) by mouth daily. 90 tablet 1   atorvastatin (LIPITOR) 10 MG tablet Take 1 tablet (10 mg total) by mouth at bedtime. 90 tablet 1   buPROPion (WELLBUTRIN XL) 150 MG 24 hr tablet Take 1 tablet (150 mg total) by mouth daily. 90 tablet 1   escitalopram (LEXAPRO) 20 MG tablet Take 1 tablet (20 mg total) by mouth daily. 90 tablet 1   famotidine (PEPCID) 20 MG tablet TAKE 1 TABLET  TWICE DAILY 180 tablet 1   LORazepam (ATIVAN) 0.5 MG tablet Take 1 tablet (0.5 mg total) by mouth daily as needed for anxiety. 30 tablet 5   pantoprazole (PROTONIX) 40 MG tablet Take 1 tablet (40 mg total) by mouth daily. 90 tablet 1   No current facility-administered medications for this visit.    Allergies:   Lisinopril    Social History:  The patient  reports that she has been smoking cigarettes. She has a 50 pack-year smoking history. She has never used smokeless tobacco. She reports current alcohol use of about 3.0 standard drinks of alcohol per week. She reports current drug use. Frequency: 7.00 times per week. Drug: Marijuana.   Family History:  The  patient's ***family history includes Cancer in her father; Colon cancer (age of onset: 52) in her father; Hypertension in her mother.    ROS:  Please see the history of present illness.   Otherwise, review of systems are positive for {NONE DEFAULTED:18576}.   All other systems are reviewed and negative.    PHYSICAL EXAM: VS:  There were no vitals taken for this visit. , BMI There is no height or weight on file to calculate BMI. GENERAL:  Well appearing HEENT:  Pupils equal round and reactive, fundi not visualized, oral mucosa unremarkable NECK:  No jugular venous distention, waveform within normal limits, carotid upstroke brisk and symmetric, no bruits, no thyromegaly LYMPHATICS:  No cervical, inguinal adenopathy LUNGS:  Clear to auscultation bilaterally BACK:  No CVA tenderness CHEST:  Unremarkable HEART:  PMI not displaced or sustained,S1 and S2 within normal limits, no S3, no S4, no clicks, no rubs, *** murmurs ABD:  Flat, positive bowel sounds normal in frequency in pitch, no bruits, no rebound, no guarding, no midline pulsatile mass, no hepatomegaly, no splenomegaly EXT:  2 plus pulses throughout, no edema, no cyanosis no clubbing SKIN:  No rashes no nodules NEURO:  Cranial nerves II through XII grossly intact, motor grossly intact throughout PSYCH:  Cognitively intact, oriented to person place and time    EKG:        Recent Labs: 01/05/2023: ALT 11; BUN 19; Creatinine, Ser 1.01; Hemoglobin 13.3; Platelets 302; Potassium 4.8; Sodium 143    Lipid Panel    Component Value Date/Time   CHOL 177 01/05/2023 1313   TRIG 100 01/05/2023 1313   TRIG 128 09/28/2014 1655   HDL 59 01/05/2023 1313   HDL 73 09/28/2014 1655   CHOLHDL 3.0 01/05/2023 1313   LDLCALC 100 (H) 01/05/2023 1313      Wt Readings from Last 3 Encounters:  01/05/23 174 lb (78.9 kg)  07/22/22 184 lb (83.5 kg)  07/02/22 184 lb (83.5 kg)      Other studies Reviewed: Additional studies/ records that were  reviewed today include: ***. Review of the above records demonstrates:  Please see elsewhere in the note.  ***   ASSESSMENT AND PLAN:  Aortic atherosclerosis:  *** ***   Current medicines are reviewed at length with the patient today.  The patient {ACTIONS; HAS/DOES NOT HAVE:19233} concerns regarding medicines.  The following changes have been made:  {PLAN; NO CHANGE:13088:s}  Labs/ tests ordered today include: *** No orders of the defined types were placed in this encounter.    Disposition:   FU with ***    Signed, Rollene Rotunda, MD  04/11/2023 2:37 PM    [facility]

## 2023-04-14 ENCOUNTER — Ambulatory Visit: Payer: Medicare HMO | Admitting: Cardiology

## 2023-06-28 ENCOUNTER — Ambulatory Visit (INDEPENDENT_AMBULATORY_CARE_PROVIDER_SITE_OTHER): Payer: Medicare Other | Admitting: Nurse Practitioner

## 2023-06-28 ENCOUNTER — Encounter: Payer: Self-pay | Admitting: Nurse Practitioner

## 2023-06-28 VITALS — BP 129/78 | HR 67 | Temp 97.5°F | Ht 65.0 in | Wt 176.0 lb

## 2023-06-28 DIAGNOSIS — Z6829 Body mass index (BMI) 29.0-29.9, adult: Secondary | ICD-10-CM

## 2023-06-28 DIAGNOSIS — E876 Hypokalemia: Secondary | ICD-10-CM | POA: Diagnosis not present

## 2023-06-28 DIAGNOSIS — F3341 Major depressive disorder, recurrent, in partial remission: Secondary | ICD-10-CM

## 2023-06-28 DIAGNOSIS — I10 Essential (primary) hypertension: Secondary | ICD-10-CM

## 2023-06-28 DIAGNOSIS — F419 Anxiety disorder, unspecified: Secondary | ICD-10-CM | POA: Diagnosis not present

## 2023-06-28 DIAGNOSIS — E7800 Pure hypercholesterolemia, unspecified: Secondary | ICD-10-CM

## 2023-06-28 DIAGNOSIS — Z23 Encounter for immunization: Secondary | ICD-10-CM | POA: Diagnosis not present

## 2023-06-28 DIAGNOSIS — K5732 Diverticulitis of large intestine without perforation or abscess without bleeding: Secondary | ICD-10-CM | POA: Diagnosis not present

## 2023-06-28 DIAGNOSIS — K2100 Gastro-esophageal reflux disease with esophagitis, without bleeding: Secondary | ICD-10-CM

## 2023-06-28 LAB — LIPID PANEL

## 2023-06-28 MED ORDER — ESCITALOPRAM OXALATE 20 MG PO TABS: 20.0000 mg | ORAL_TABLET | Freq: Every day | ORAL | 1 refills | Status: DC

## 2023-06-28 MED ORDER — PANTOPRAZOLE SODIUM 40 MG PO TBEC: 40.0000 mg | DELAYED_RELEASE_TABLET | Freq: Every day | ORAL | 1 refills | Status: DC

## 2023-06-28 MED ORDER — AMLODIPINE BESYLATE 5 MG PO TABS: 5.0000 mg | ORAL_TABLET | Freq: Every day | ORAL | 1 refills | Status: DC

## 2023-06-28 MED ORDER — FAMOTIDINE 20 MG PO TABS: 20.0000 mg | ORAL_TABLET | Freq: Two times a day (BID) | ORAL | 1 refills | Status: DC

## 2023-06-28 MED ORDER — BUPROPION HCL ER (XL) 150 MG PO TB24: 150.0000 mg | ORAL_TABLET | Freq: Every day | ORAL | 1 refills | Status: DC

## 2023-06-28 MED ORDER — LORAZEPAM 0.5 MG PO TABS
0.5000 mg | ORAL_TABLET | Freq: Every day | ORAL | 5 refills | Status: DC | PRN
Start: 2023-06-28 — End: 2023-12-27

## 2023-06-28 MED ORDER — ATORVASTATIN CALCIUM 10 MG PO TABS: 10.0000 mg | ORAL_TABLET | Freq: Every day | ORAL | 1 refills | Status: DC

## 2023-06-28 NOTE — Patient Instructions (Signed)

## 2023-06-28 NOTE — Progress Notes (Signed)
Subjective:    Patient ID: Theresa Reyes, female    DOB: [birth year], 68 y.o.   MRN: [id number]   Chief Complaint: medical management of chronic issues     HPI:  Theresa Reyes is a 68 y.o. who identifies as a female who was assigned female at birth.   Social history: Lives with: husband and son Work history: retired   Water engineer in today for follow up of the following chronic medical issues:  1. Essential hypertension No c/o chest pain, sob or headache. Does not check blood pressure at home. BP Readings from Last 3 Encounters:  01/05/23 129/80  07/02/22 135/73  04/23/22 131/80     2. Diverticulitis large intestine w/o perforation or abscess w/o bleeding No recent flare ups. Tries to avoid foods that cause flare up.  3. Gastroesophageal reflux disease with esophagitis without hemorrhage Is on protonix and is doing well.  4. Anxiety Has cut xanax back to only a couple times a month     06/28/2023   12:05 PM 01/05/2023   12:40 PM 07/02/2022   11:33 AM 04/23/2022   11:30 AM  GAD 7 : Generalized Anxiety Score  Nervous, Anxious, on Edge 2 2 0 1  Control/stop worrying 1 2 0 1  Worry too much - different things 1 2 0 1  Trouble relaxing 1 2 0 1  Restless 1 1 0 1  Easily annoyed or irritable 2 2 0 1  Afraid - awful might happen 0 2 0 1  Total GAD 7 Score 8 13 0 7  Anxiety Difficulty Not difficult at all Somewhat difficult Not difficult at all Somewhat difficult       5. Recurrent major depressive disorder, in partial remission (HCC) We added wellbutrin to lasix at last visit.      06/28/2023   12:05 PM 01/05/2023   12:39 PM 07/22/2022   10:53 AM 07/02/2022   11:32 AM 04/23/2022   11:31 AM  Depression screen PHQ 2/9  Decreased Interest 1 2 0 0 1  Down, Depressed, Hopeless 1 1 0 0 1  PHQ - 2 Score 2 3 0 0 2  Altered sleeping 1 0  0 1  Tired, decreased energy 1 2  0 1  Change in appetite 1 2  0 1  Feeling bad or failure about yourself  0 0  0 0  Trouble  concentrating 2 2  0 0  Moving slowly or fidgety/restless 1 0  0 0  Suicidal thoughts 0 0  0 0  PHQ-9 Score 8 9  0 5  Difficult doing work/chores Not difficult at all Somewhat difficult  Not difficult at all Somewhat difficult     6. Hypokalemia No c/o muscle cramping Lab Results  Component Value Date   K 4.8 01/05/2023    7. Non-ST elevation MI (NSTEMI) (HCC) See hospital note below  8. BMI 28.0-28.9,adult No recent weight changes  Wt Readings from Last 3 Encounters:  06/28/23 176 lb (79.8 kg)  01/05/23 174 lb (78.9 kg)  07/22/22 184 lb (83.5 kg)   BMI Readings from Last 3 Encounters:  06/28/23 29.29 kg/m  01/05/23 28.96 kg/m  07/22/22 30.62 kg/m       New complaints: None today  Allergies  Allergen Reactions   Lisinopril     Vertigo   Outpatient Encounter Medications as of 06/28/2023  Medication Sig   amLODipine (NORVASC) 5 MG tablet Take 1 tablet (5 mg total) by mouth daily.   atorvastatin (LIPITOR) 10  MG tablet Take 1 tablet (10 mg total) by mouth at bedtime.   buPROPion (WELLBUTRIN XL) 150 MG 24 hr tablet Take 1 tablet (150 mg total) by mouth daily.   escitalopram (LEXAPRO) 20 MG tablet Take 1 tablet (20 mg total) by mouth daily.   famotidine (PEPCID) 20 MG tablet TAKE 1 TABLET TWICE DAILY   LORazepam (ATIVAN) 0.5 MG tablet Take 1 tablet (0.5 mg total) by mouth daily as needed for anxiety.   pantoprazole (PROTONIX) 40 MG tablet Take 1 tablet (40 mg total) by mouth daily.   No facility-administered encounter medications on file as of 06/28/2023.    Past Surgical History:  Procedure Laterality Date   ABDOMINAL HYSTERECTOMY     CERVICAL CONIZATION W/BX N/A 12/03/2021   Procedure: CONIZATION CERVIX WITH BIOPSY; ENDOCERVICAL BIOPSY;  Surgeon: Carver Fila, MD;  Location: [facility];  Service: Gynecology;  Laterality: N/A;   CESAREAN SECTION     CHOLECYSTECTOMY  2017   COLONOSCOPY WITH ESOPHAGOGASTRODUODENOSCOPY (EGD)  08/2021   DILATATION &  CURETTAGE/HYSTEROSCOPY WITH MYOSURE N/A 01/14/2022   Procedure: DILATATION & CURETTAGE;  Surgeon: Carver Fila, MD;  Location: [facility];  Service: Gynecology;  Laterality: N/A;   ECTOPIC PREGNANCY SURGERY     x2, both tubes removed   EYE SURGERY     OPEN PARTIAL HEPATECTOMY  Right 2017   right lobe benign tumor   ROBOTIC ASSISTED BILATERAL SALPINGO OOPHERECTOMY Bilateral 01/14/2022   Procedure: XI ROBOTIC ASSISTED BILATERAL SALPINGO OOPHORECTOMY;  Surgeon: Carver Fila, MD;  Location: [facility];  Service: Gynecology;  Laterality: Bilateral;   ROBOTIC ASSISTED TOTAL HYSTERECTOMY N/A 01/14/2022   Procedure: XI ROBOTIC ASSISTED TOTAL HYSTERECTOMY, STAGING, PERITONEAL BIOPSIES, OMENTECTOMY, LYSIS OF ADHESIONS, PELVIC WASHINGS;  Surgeon: Carver Fila, MD;  Location: [facility];  Service: Gynecology;  Laterality: N/A;    Family History  Problem Relation Age of Onset   Hypertension Mother    Colon cancer Father 70       Deceased age 42 from colon ca   Cancer Father       Controlled substance contract: n/a     Review of Systems  Constitutional:  Negative for diaphoresis.  Eyes:  Negative for pain.  Respiratory:  Negative for shortness of breath.   Cardiovascular:  Negative for chest pain, palpitations and leg swelling.  Gastrointestinal:  Negative for abdominal pain.  Endocrine: Negative for polydipsia.  Skin:  Negative for rash.  Neurological:  Negative for dizziness, weakness and headaches.  Hematological:  Does not bruise/bleed easily.  All other systems reviewed and are negative.      Objective:   Physical Exam Vitals and nursing note reviewed.  Constitutional:      General: She is not in acute distress.    Appearance: Normal appearance. She is well-developed.  HENT:     Head: Normocephalic.     Right Ear: Tympanic membrane normal.     Left Ear: Tympanic membrane normal.     Nose: Nose normal.     Mouth/Throat:     Mouth: Mucous membranes are moist.   Eyes:     Pupils: Pupils are equal, round, and reactive to light.  Neck:     Vascular: No carotid bruit or JVD.  Cardiovascular:     Rate and Rhythm: Normal rate and regular rhythm.     Heart sounds: Normal heart sounds.  Pulmonary:     Effort: Pulmonary effort is normal. No respiratory distress.     Breath sounds: Normal breath  sounds. No wheezing or rales.  Chest:     Chest wall: No tenderness.  Abdominal:     General: Bowel sounds are normal. There is no distension or abdominal bruit.     Palpations: Abdomen is soft. There is no hepatomegaly, splenomegaly, mass or pulsatile mass.     Tenderness: There is no abdominal tenderness.  Musculoskeletal:        General: Normal range of motion.     Cervical back: Normal range of motion and neck supple.  Lymphadenopathy:     Cervical: No cervical adenopathy.  Skin:    General: Skin is warm and dry.  Neurological:     Mental Status: She is alert and oriented to person, place, and time.     Deep Tendon Reflexes: Reflexes are normal and symmetric.  Psychiatric:        Behavior: Behavior normal.        Thought Content: Thought content normal.        Judgment: Judgment normal.    BP 129/78   Pulse 67   Temp (!) 97.5 F (36.4 C) (Temporal)   Ht 5\' 5"  (1.651 m)   Wt 176 lb (79.8 kg)   SpO2 95%   BMI 29.29 kg/m          Assessment & Plan:   Elira Colasanti comes in today with chief complaint of No chief complaint on file.   Diagnosis and orders addressed:  1. Essential hypertension Low sodium diet - amLODipine (NORVASC) 5 MG tablet; Take 1 tablet (5 mg total) by mouth daily.  Dispense: 90 tablet; Refill: 1 - atorvastatin (LIPITOR) 10 MG tablet; Take 1 tablet (10 mg total) by mouth at bedtime.  Dispense: 90 tablet; Refill: 1 - CBC with Differential/Platelet - CMP14+EGFR - Lipid panel  2. Diverticulitis large intestine w/o perforation or abscess w/o bleeding Watch diet to prevent flare up  3. Gastroesophageal reflux  disease with esophagitis without hemorrhage Avoid spicy foods Do not eat 2 hours prior to bedtime  - pantoprazole (PROTONIX) 40 MG tablet; Take 1 tablet (40 mg total) by mouth daily.  Dispense: 90 tablet; Refill: 1  4. Anxiety Stress management Stop atarax Added ativan - LORazepam (ATIVAN) 0.5 MG tablet; Take 1 tablet (0.5 mg total) by mouth daily as needed for anxiety.  Dispense: 30 tablet; Refill: 5  5. Recurrent major depressive disorder, in partial remission (HCC) Added wellbutrin to lexapro - buPROPion (WELLBUTRIN XL) 150 MG 24 hr tablet; Take 1 tablet (150 mg total) by mouth daily.  Dispense: 90 tablet; Refill: 1 - escitalopram (LEXAPRO) 20 MG tablet; Take 1 tablet (20 mg total) by mouth daily.  Dispense: 90 tablet; Refill: 1  6. Hypokalemia Labs pending  7. Non-ST elevation MI (NSTEMI) (HCC) - Ambulatory referral to Cardiology  8. BMI 28.0-28.9,adult Discussed diet and exercise for person with BMI >25 Will recheck weight in 3-6 months    Labs pending Health Maintenance reviewed Diet and exercise encouraged  Follow up plan: 6 months   Mary-Margaret Daphine Deutscher, FNP

## 2023-06-28 NOTE — Addendum Note (Signed)
Addended by: Bennie Pierini on: 06/28/2023 12:17 PM   Modules accepted: Level of Service

## 2023-06-29 LAB — CMP14+EGFR
ALT: 16 IU/L (ref 0–32)
AST: 17 IU/L (ref 0–40)
Albumin: 4.2 g/dL (ref 3.9–4.9)
Alkaline Phosphatase: 64 IU/L (ref 44–121)
BUN/Creatinine Ratio: 13 (ref 12–28)
BUN: 11 mg/dL (ref 8–27)
Bilirubin Total: 0.3 mg/dL (ref 0.0–1.2)
CO2: 24 mmol/L (ref 20–29)
Calcium: 9.4 mg/dL (ref 8.7–10.3)
Chloride: 107 mmol/L — ABNORMAL HIGH (ref 96–106)
Creatinine, Ser: 0.88 mg/dL (ref 0.57–1.00)
Globulin, Total: 2.3 g/dL (ref 1.5–4.5)
Glucose: 87 mg/dL (ref 70–99)
Potassium: 4.4 mmol/L (ref 3.5–5.2)
Sodium: 143 mmol/L (ref 134–144)
Total Protein: 6.5 g/dL (ref 6.0–8.5)
eGFR: 72 mL/min/{1.73_m2} (ref >59)

## 2023-06-29 LAB — CBC WITH DIFFERENTIAL/PLATELET
Basophils Absolute: 0.1 10*3/uL (ref 0.0–0.2)
Basos: 1 %
EOS (ABSOLUTE): 0.4 10*3/uL (ref 0.0–0.4)
Eos: 5 %
Hematocrit: 42.1 % (ref 34.0–46.6)
Hemoglobin: 13.8 g/dL (ref 11.1–15.9)
Immature Grans (Abs): 0 10*3/uL (ref 0.0–0.1)
Immature Granulocytes: 0 %
Lymphocytes Absolute: 2.1 10*3/uL (ref 0.7–3.1)
Lymphs: 24 %
MCH: 29.8 pg (ref 26.6–33.0)
MCHC: 32.8 g/dL (ref 31.5–35.7)
MCV: 91 fL (ref 79–97)
Monocytes Absolute: 0.5 10*3/uL (ref 0.1–0.9)
Monocytes: 6 %
Neutrophils Absolute: 5.6 10*3/uL (ref 1.4–7.0)
Neutrophils: 64 %
Platelets: 296 10*3/uL (ref 150–450)
RBC: 4.63 x10E6/uL (ref 3.77–5.28)
RDW: 13.2 % (ref 11.7–15.4)
WBC: 8.7 10*3/uL (ref 3.4–10.8)

## 2023-06-29 LAB — LIPID PANEL
Cholesterol, Total: 177 mg/dL (ref 100–199)
HDL: 62 mg/dL (ref >39)
LDL CALC COMMENT:: 2.9 ratio (ref 0.0–4.4)
LDL Chol Calc (NIH): 98 mg/dL (ref 0–99)
Triglycerides: 96 mg/dL (ref 0–149)
VLDL Cholesterol Cal: 17 mg/dL (ref 5–40)

## 2023-07-14 ENCOUNTER — Telehealth: Payer: Medicare Other | Admitting: Physician Assistant

## 2023-07-14 DIAGNOSIS — B9689 Other specified bacterial agents as the cause of diseases classified elsewhere: Secondary | ICD-10-CM

## 2023-07-14 DIAGNOSIS — J208 Acute bronchitis due to other specified organisms: Secondary | ICD-10-CM | POA: Diagnosis not present

## 2023-07-14 MED ORDER — BENZONATATE 100 MG PO CAPS: 100.0000 mg | ORAL_CAPSULE | Freq: Three times a day (TID) | ORAL | 0 refills | Status: DC | PRN

## 2023-07-14 MED ORDER — AZITHROMYCIN 250 MG PO TABS: ORAL_TABLET | ORAL | 0 refills | Status: AC

## 2023-07-14 NOTE — Progress Notes (Signed)
 E-Visit for Cough   We are sorry that you are not feeling well.  Here is how we plan to help!  Based on your presentation I believe you most likely have A cough due to bacteria.  When patients have a fever and a productive cough with a change in color or increased sputum production, we are concerned about bacterial bronchitis.  If left untreated it can progress to pneumonia.  If your symptoms do not improve with your treatment plan it is important that you contact your provider.   I have prescribed Azithromyin 250 mg: two tablets now and then one tablet daily for 4 additonal days    In addition you may use A non-prescription cough medication called Mucinex DM: take 2 tablets every 12 hours. and A prescription cough medication called Tessalon Perles 100mg . You may take 1-2 capsules every 8 hours as needed for your cough.  From your responses in the eVisit questionnaire you describe inflammation in the upper respiratory tract which is causing a significant cough.  This is commonly called Bronchitis and has four common causes:   Allergies Viral Infections Acid Reflux Bacterial Infection Allergies, viruses and acid reflux are treated by controlling symptoms or eliminating the cause. An example might be a cough caused by taking certain blood pressure medications. You stop the cough by changing the medication. Another example might be a cough caused by acid reflux. Controlling the reflux helps control the cough.     HOME CARE Only take medications as instructed by your medical team. Complete the entire course of an antibiotic. Drink plenty of fluids and get plenty of rest. Avoid close contacts especially the very young and the elderly Cover your mouth if you cough or cough into your sleeve. Always remember to wash your hands A steam or ultrasonic humidifier can help congestion.   GET HELP RIGHT AWAY IF: You develop worsening fever. You become short of breath You cough up blood. Your symptoms  persist after you have completed your treatment plan MAKE SURE YOU  Understand these instructions. Will watch your condition. Will get help right away if you are not doing well or get worse.    Thank you for choosing an e-visit.  Your e-visit answers were reviewed by a board certified advanced clinical practitioner to complete your personal care plan. Depending upon the condition, your plan could have included both over the counter or prescription medications.  Please review your pharmacy choice. Make sure the pharmacy is open so you can pick up prescription now. If there is a problem, you may contact your provider through [employer] and have the prescription routed to another pharmacy.  Your safety is important to [place]. If you have drug allergies check your prescription carefully.   For the next 24 hours you can use MyChart to ask questions about today's visit, request a non-urgent call back, or ask for a work or school excuse. You will get an email in the next two days asking about your experience. I hope that your e-visit has been valuable and will speed your recovery.    I have spent 5 minutes in review of e-visit questionnaire, review and updating patient chart, medical decision making and response to patient.   Margaretann Loveless, PA-C

## 2023-12-07 ENCOUNTER — Telehealth: Admitting: Physician Assistant

## 2023-12-07 DIAGNOSIS — R42 Dizziness and giddiness: Secondary | ICD-10-CM

## 2023-12-07 NOTE — Progress Notes (Signed)
  Because you are having active vertigo that needs further assessment than what we can do safely via e-visit, I feel your condition warrants further evaluation and I recommend that you be seen in a face-to-face visit.   NOTE: There will be NO CHARGE for this E-Visit   If you are having a true medical emergency, please call 911.     For an urgent face to face visit, [facility] has multiple urgent care centers for your convenience.  Click the link below for the full list of locations and hours, walk-in wait times, appointment scheduling options and driving directions:  Urgent Care - [place], [place], [place], [place], [place], [place]  [place]     Your MyChart E-visit questionnaire answers were reviewed by a board certified advanced clinical practitioner to complete your personal care plan based on your specific symptoms.    Thank you for using e-Visits.

## 2023-12-23 ENCOUNTER — Other Ambulatory Visit: Payer: Self-pay | Admitting: Nurse Practitioner

## 2023-12-23 DIAGNOSIS — I10 Essential (primary) hypertension: Secondary | ICD-10-CM

## 2023-12-23 DIAGNOSIS — F3341 Major depressive disorder, recurrent, in partial remission: Secondary | ICD-10-CM

## 2023-12-23 DIAGNOSIS — K2100 Gastro-esophageal reflux disease with esophagitis, without bleeding: Secondary | ICD-10-CM

## 2023-12-27 ENCOUNTER — Encounter: Payer: Self-pay | Admitting: Nurse Practitioner

## 2023-12-27 ENCOUNTER — Ambulatory Visit: Payer: Medicare Other | Admitting: Nurse Practitioner

## 2023-12-27 VITALS — BP 142/78 | HR 64 | Temp 97.0°F | Ht 65.0 in | Wt 183.0 lb

## 2023-12-27 DIAGNOSIS — F3341 Major depressive disorder, recurrent, in partial remission: Secondary | ICD-10-CM

## 2023-12-27 DIAGNOSIS — E876 Hypokalemia: Secondary | ICD-10-CM | POA: Diagnosis not present

## 2023-12-27 DIAGNOSIS — I252 Old myocardial infarction: Secondary | ICD-10-CM | POA: Diagnosis not present

## 2023-12-27 DIAGNOSIS — E7800 Pure hypercholesterolemia, unspecified: Secondary | ICD-10-CM | POA: Diagnosis not present

## 2023-12-27 DIAGNOSIS — K2100 Gastro-esophageal reflux disease with esophagitis, without bleeding: Secondary | ICD-10-CM | POA: Diagnosis not present

## 2023-12-27 DIAGNOSIS — I10 Essential (primary) hypertension: Secondary | ICD-10-CM

## 2023-12-27 DIAGNOSIS — Z6829 Body mass index (BMI) 29.0-29.9, adult: Secondary | ICD-10-CM

## 2023-12-27 DIAGNOSIS — F419 Anxiety disorder, unspecified: Secondary | ICD-10-CM | POA: Diagnosis not present

## 2023-12-27 DIAGNOSIS — K5732 Diverticulitis of large intestine without perforation or abscess without bleeding: Secondary | ICD-10-CM | POA: Diagnosis not present

## 2023-12-27 DIAGNOSIS — Z79891 Long term (current) use of opiate analgesic: Secondary | ICD-10-CM | POA: Diagnosis not present

## 2023-12-27 LAB — LIPID PANEL

## 2023-12-27 MED ORDER — FAMOTIDINE 20 MG PO TABS: 20.0000 mg | ORAL_TABLET | Freq: Two times a day (BID) | ORAL | 1 refills | Status: DC

## 2023-12-27 MED ORDER — LORAZEPAM 0.5 MG PO TABS: 0.5000 mg | ORAL_TABLET | Freq: Every day | ORAL | 5 refills | Status: DC | PRN

## 2023-12-27 MED ORDER — ATORVASTATIN CALCIUM 10 MG PO TABS: 10.0000 mg | ORAL_TABLET | Freq: Every day | ORAL | 1 refills | Status: DC

## 2023-12-27 MED ORDER — BUPROPION HCL ER (XL) 150 MG PO TB24: 150.0000 mg | ORAL_TABLET | Freq: Every day | ORAL | 1 refills | Status: DC

## 2023-12-27 MED ORDER — ESCITALOPRAM OXALATE 20 MG PO TABS: 20.0000 mg | ORAL_TABLET | Freq: Every day | ORAL | 1 refills | Status: DC

## 2023-12-27 MED ORDER — AMLODIPINE BESYLATE 5 MG PO TABS: 5.0000 mg | ORAL_TABLET | Freq: Every day | ORAL | 1 refills | Status: DC

## 2023-12-27 MED ORDER — PANTOPRAZOLE SODIUM 40 MG PO TBEC: 40.0000 mg | DELAYED_RELEASE_TABLET | Freq: Every day | ORAL | 1 refills | Status: DC

## 2023-12-27 NOTE — Progress Notes (Signed)
 History of   Subjective:    Patient ID: Theresa Reyes, female    DOB: 26-Jun-1955, 68 y.o.   MRN: [id number]   Chief Complaint: medical management of chronic issues     HPI:  Theresa Reyes is a 68 y.o. who identifies as a female who was assigned female at birth.   Social history: Lives with: husband and son Work history: retired   Water engineer in today for follow up of the following chronic medical issues:  1. Essential hypertension No c/o chest pain, sob or headache. Does not check blood pressure at home. BP Readings from Last 3 Encounters:  06/28/23 129/78  01/05/23 129/80  07/02/22 135/73     2. Diverticulitis large intestine w/o perforation or abscess w/o bleeding No recent flare ups. Tries to avoid foods that cause flare up.  3. Gastroesophageal reflux disease with esophagitis without hemorrhage Is on protonix  and is doing well.  4. Anxiety Has cut xanax back to only a couple times a month     12/27/2023   11:01 AM 06/28/2023   12:05 PM 01/05/2023   12:40 PM 07/02/2022   11:33 AM  GAD 7 : Generalized Anxiety Score  Nervous, Anxious, on Edge 2 2 2  0  Control/stop worrying 1 1 2  0  Worry too much - different things 1 1 2  0  Trouble relaxing 1 1 2  0  Restless 0 1 1 0  Easily annoyed or irritable 0 2 2 0  Afraid - awful might happen 1 0 2 0  Total GAD 7 Score 6 8 13  0  Anxiety Difficulty Somewhat difficult Not difficult at all Somewhat difficult Not difficult at all         5. Recurrent major depressive disorder, in partial remission (HCC) We added wellbutrin  to lasix at last visit.      12/27/2023   11:00 AM 06/28/2023   12:05 PM 01/05/2023   12:39 PM 07/22/2022   10:53 AM 07/02/2022   11:32 AM  Depression screen PHQ 2/9  Decreased Interest 1 1 2  0 0  Down, Depressed, Hopeless 1 1 1  0 0  PHQ - 2 Score 2 2 3  0 0  Altered sleeping 1 1 0  0  Tired, decreased energy 2 1 2   0  Change in appetite 0 1 2  0  Feeling bad or failure about yourself  1 0 0  0  Trouble  concentrating 2 2 2   0  Moving slowly or fidgety/restless 0 1 0  0  Suicidal thoughts 0 0 0  0  PHQ-9 Score 8 8 9   0  Difficult doing work/chores Somewhat difficult Not difficult at all Somewhat difficult  Not difficult at all      6. Hypokalemia No c/o muscle cramping Lab Results  Component Value Date   K 4.4 06/28/2023    7. History of Non-ST elevation MI (NSTEMI) (HCC) See hospital note below  8. BMI 29.0-29.9,adult No recent weight changes Wt Readings from Last 3 Encounters:  12/27/23 183 lb (83 kg)  06/28/23 176 lb (79.8 kg)  01/05/23 174 lb (78.9 kg)   BMI Readings from Last 3 Encounters:  12/27/23 30.45 kg/m  06/28/23 29.29 kg/m  01/05/23 28.96 kg/m          New complaints: None today  Allergies  Allergen Reactions   Lisinopril      Vertigo   Outpatient Encounter Medications as of 12/27/2023  Medication Sig   amLODipine  (NORVASC ) 5 MG tablet Take 1 tablet by  mouth once daily   atorvastatin  (LIPITOR) 10 MG tablet Take 1 tablet (10 mg total) by mouth at bedtime.   benzonatate  (TESSALON ) 100 MG capsule Take 1-2 capsules (100-200 mg total) by mouth 3 (three) times daily as needed.   buPROPion  (WELLBUTRIN  XL) 150 MG 24 hr tablet Take 1 tablet by mouth once daily   escitalopram  (LEXAPRO ) 20 MG tablet Take 1 tablet by mouth once daily   famotidine  (PEPCID ) 20 MG tablet Take 1 tablet by mouth twice daily   LORazepam  (ATIVAN ) 0.5 MG tablet Take 1 tablet (0.5 mg total) by mouth daily as needed for anxiety.   pantoprazole  (PROTONIX ) 40 MG tablet Take 1 tablet by mouth once daily   No facility-administered encounter medications on file as of 12/27/2023.    Past Surgical History:  Procedure Laterality Date   ABDOMINAL HYSTERECTOMY     CERVICAL CONIZATION W/BX N/A 12/03/2021   Procedure: CONIZATION CERVIX WITH BIOPSY; ENDOCERVICAL BIOPSY;  Surgeon: Viktoria Comer SAUNDERS, MD;  Location: [facility];  Service: Gynecology;  Laterality: N/A;   CESAREAN SECTION      CHOLECYSTECTOMY  2017   COLONOSCOPY WITH ESOPHAGOGASTRODUODENOSCOPY (EGD)  08/2021   DILATATION & CURETTAGE/HYSTEROSCOPY WITH MYOSURE N/A 01/14/2022   Procedure: DILATATION & CURETTAGE;  Surgeon: Viktoria Comer SAUNDERS, MD;  Location: [facility];  Service: Gynecology;  Laterality: N/A;   ECTOPIC PREGNANCY SURGERY     x2, both tubes removed   EYE SURGERY     OPEN PARTIAL HEPATECTOMY  Right 2017   right lobe benign tumor   ROBOTIC ASSISTED BILATERAL SALPINGO OOPHERECTOMY Bilateral 01/14/2022   Procedure: XI ROBOTIC ASSISTED BILATERAL SALPINGO OOPHORECTOMY;  Surgeon: Viktoria Comer SAUNDERS, MD;  Location: [facility];  Service: Gynecology;  Laterality: Bilateral;   ROBOTIC ASSISTED TOTAL HYSTERECTOMY N/A 01/14/2022   Procedure: XI ROBOTIC ASSISTED TOTAL HYSTERECTOMY, STAGING, PERITONEAL BIOPSIES, OMENTECTOMY, LYSIS OF ADHESIONS, PELVIC WASHINGS;  Surgeon: Viktoria Comer SAUNDERS, MD;  Location: [facility];  Service: Gynecology;  Laterality: N/A;    Family History  Problem Relation Age of Onset   Hypertension Mother    Colon cancer Father 96       Deceased age 67 from colon ca   Cancer Father       Controlled substance contract: n/a     Review of Systems  Constitutional:  Negative for diaphoresis.  Eyes:  Negative for pain.  Respiratory:  Negative for shortness of breath.   Cardiovascular:  Negative for chest pain, palpitations and leg swelling.  Gastrointestinal:  Negative for abdominal pain.  Endocrine: Negative for polydipsia.  Skin:  Negative for rash.  Neurological:  Negative for dizziness, weakness and headaches.  Hematological:  Does not bruise/bleed easily.  All other systems reviewed and are negative.      Objective:   Physical Exam Vitals and nursing note reviewed.  Constitutional:      General: She is not in acute distress.    Appearance: Normal appearance. She is well-developed.  HENT:     Head: Normocephalic.     Right Ear: Tympanic membrane normal.     Left Ear: Tympanic  membrane normal.     Nose: Nose normal.     Mouth/Throat:     Mouth: Mucous membranes are moist.  Eyes:     Pupils: Pupils are equal, round, and reactive to light.  Neck:     Vascular: No carotid bruit or JVD.  Cardiovascular:     Rate and Rhythm: Normal rate and regular rhythm.     Heart sounds:  Normal heart sounds.  Pulmonary:     Effort: Pulmonary effort is normal. No respiratory distress.     Breath sounds: Normal breath sounds. No wheezing or rales.  Chest:     Chest wall: No tenderness.  Abdominal:     General: Bowel sounds are normal. There is no distension or abdominal bruit.     Palpations: Abdomen is soft. There is no hepatomegaly, splenomegaly, mass or pulsatile mass.     Tenderness: There is no abdominal tenderness.  Musculoskeletal:        General: Normal range of motion.     Cervical back: Normal range of motion and neck supple.  Lymphadenopathy:     Cervical: No cervical adenopathy.  Skin:    General: Skin is warm and dry.  Neurological:     Mental Status: She is alert and oriented to person, place, and time.     Deep Tendon Reflexes: Reflexes are normal and symmetric.  Psychiatric:        Behavior: Behavior normal.        Thought Content: Thought content normal.        Judgment: Judgment normal.    BP (!) 142/78   Pulse 64   Temp (!) 97 F (36.1 C) (Temporal)   Ht 5' 5 (1.651 m)   Wt 183 lb (83 kg)   SpO2 96%   BMI 30.45 kg/m        Assessment & Plan:   Almeta Geisel comes in today with chief complaint of medical management of chronic issues    Diagnosis and orders addressed:  1. Essential hypertension Low sodium diet - amLODipine  (NORVASC ) 5 MG tablet; Take 1 tablet (5 mg total) by mouth daily.  Dispense: 90 tablet; Refill: 1 - atorvastatin  (LIPITOR) 10 MG tablet; Take 1 tablet (10 mg total) by mouth at bedtime.  Dispense: 90 tablet; Refill: 1 - CBC with Differential/Platelet - CMP14+EGFR - Lipid panel  2. Diverticulitis large  intestine w/o perforation or abscess w/o bleeding Watch diet to prevent flare up  3. Gastroesophageal reflux disease with esophagitis without hemorrhage Avoid spicy foods Do not eat 2 hours prior to bedtime  - pantoprazole  (PROTONIX ) 40 MG tablet; Take 1 tablet (40 mg total) by mouth daily.  Dispense: 90 tablet; Refill: 1  4. Anxiety Stress management Stop atarax  Added ativan  - LORazepam  (ATIVAN ) 0.5 MG tablet; Take 1 tablet (0.5 mg total) by mouth daily as needed for anxiety.  Dispense: 30 tablet; Refill: 5  5. Recurrent major depressive disorder, in partial remission (HCC) Added wellbutrin  to lexapro  - buPROPion  (WELLBUTRIN  XL) 150 MG 24 hr tablet; Take 1 tablet (150 mg total) by mouth daily.  Dispense: 90 tablet; Refill: 1 - escitalopram  (LEXAPRO ) 20 MG tablet; Take 1 tablet (20 mg total) by mouth daily.  Dispense: 90 tablet; Refill: 1  6. Hypokalemia Labs pending  7. Non-ST elevation MI (NSTEMI) (HCC) - Ambulatory referral to Cardiology  8. BMI 28.0-28.9,adult Discussed diet and exercise for person with BMI >25 Will recheck weight in 3-6 months    Labs pending Health Maintenance reviewed Diet and exercise encouraged  Follow up plan: 6 months   Mary-Margaret Gladis, FNP

## 2023-12-27 NOTE — Patient Instructions (Signed)

## 2023-12-28 ENCOUNTER — Ambulatory Visit: Payer: Self-pay | Admitting: Nurse Practitioner

## 2023-12-28 ENCOUNTER — Other Ambulatory Visit: Payer: Self-pay

## 2023-12-28 DIAGNOSIS — E875 Hyperkalemia: Secondary | ICD-10-CM

## 2023-12-28 LAB — CMP14+EGFR
ALT: 12 IU/L (ref 0–32)
AST: 20 IU/L (ref 0–40)
Albumin: 4.5 g/dL (ref 3.9–4.9)
Alkaline Phosphatase: 61 IU/L (ref 44–121)
BUN/Creatinine Ratio: 23 (ref 12–28)
BUN: 21 mg/dL (ref 8–27)
Bilirubin Total: 0.4 mg/dL (ref 0.0–1.2)
CO2: 23 mmol/L (ref 20–29)
Calcium: 9.6 mg/dL (ref 8.7–10.3)
Chloride: 103 mmol/L (ref 96–106)
Creatinine, Ser: 0.92 mg/dL (ref 0.57–1.00)
Globulin, Total: 2.7 g/dL (ref 1.5–4.5)
Glucose: 88 mg/dL (ref 70–99)
Potassium: 5.4 mmol/L — AB (ref 3.5–5.2)
Sodium: 138 mmol/L (ref 134–144)
Total Protein: 7.2 g/dL (ref 6.0–8.5)
eGFR: 68 mL/min/1.73 (ref >59)

## 2023-12-28 LAB — CBC WITH DIFFERENTIAL/PLATELET
Basophils Absolute: 0 x10E3/uL (ref 0.0–0.2)
Basos: 1 %
EOS (ABSOLUTE): 0.2 x10E3/uL (ref 0.0–0.4)
Eos: 3 %
Hematocrit: 43.5 % (ref 34.0–46.6)
Hemoglobin: 14 g/dL (ref 11.1–15.9)
Immature Grans (Abs): 0 x10E3/uL (ref 0.0–0.1)
Immature Granulocytes: 0 %
Lymphocytes Absolute: 1.8 x10E3/uL (ref 0.7–3.1)
Lymphs: 25 %
MCH: 29.7 pg (ref 26.6–33.0)
MCHC: 32.2 g/dL (ref 31.5–35.7)
MCV: 92 fL (ref 79–97)
Monocytes Absolute: 0.5 x10E3/uL (ref 0.1–0.9)
Monocytes: 7 %
Neutrophils Absolute: 4.8 x10E3/uL (ref 1.4–7.0)
Neutrophils: 64 %
Platelets: 301 x10E3/uL (ref 150–450)
RBC: 4.71 x10E6/uL (ref 3.77–5.28)
RDW: 13.1 % (ref 11.7–15.4)
WBC: 7.3 x10E3/uL (ref 3.4–10.8)

## 2023-12-28 LAB — LIPID PANEL
Cholesterol, Total: 219 mg/dL — AB (ref 100–199)
HDL: 76 mg/dL (ref >39)
LDL CALC COMMENT:: 2.9 ratio (ref 0.0–4.4)
LDL Chol Calc (NIH): 114 mg/dL — AB (ref 0–99)
Triglycerides: 169 mg/dL — AB (ref 0–149)
VLDL Cholesterol Cal: 29 mg/dL (ref 5–40)

## 2023-12-29 LAB — TOXASSURE SELECT 13 (MW), URINE

## 2024-02-08 ENCOUNTER — Ambulatory Visit (INDEPENDENT_AMBULATORY_CARE_PROVIDER_SITE_OTHER)

## 2024-02-08 VITALS — BP 142/78 | HR 64 | Ht 65.0 in | Wt 183.0 lb

## 2024-02-08 DIAGNOSIS — Z122 Encounter for screening for malignant neoplasm of respiratory organs: Secondary | ICD-10-CM

## 2024-02-08 DIAGNOSIS — Z0000 Encounter for general adult medical examination without abnormal findings: Secondary | ICD-10-CM | POA: Diagnosis not present

## 2024-02-08 DIAGNOSIS — Z13820 Encounter for screening for osteoporosis: Secondary | ICD-10-CM

## 2024-02-08 NOTE — Progress Notes (Signed)
 Subjective:   Theresa Reyes is a 68 y.o. who presents for a Medicare Wellness preventive visit.  As a reminder, Annual Wellness Visits don't include a physical exam, and some assessments may be limited, especially if this visit is performed virtually. We may recommend an in-person follow-up visit with your provider if needed.  Visit Complete: Virtual I connected with  Theresa Reyes on 02/08/24 by a audio enabled telemedicine application and verified that I am speaking with the correct person using two identifiers.  Patient Location: Home  Provider Location: Home Office  I discussed the limitations of evaluation and management by telemedicine. The patient expressed understanding and agreed to proceed.  Vital Signs: Because this visit was a virtual/telehealth visit, some criteria may be missing or patient reported. Any vitals not documented were not able to be obtained and vitals that have been documented are patient reported.  VideoDeclined- This patient declined Librarian, academic. Therefore the visit was completed with audio only.  Persons Participating in Visit: Patient.  AWV Questionnaire: Yes: Patient Medicare AWV questionnaire was completed by the patient on 02/07/24; I have confirmed that all information answered by patient is correct and no changes since this date.        Objective:    Today's Vitals   02/08/24 1042  BP: (!) 142/78  Pulse: 64  Weight: 183 lb (83 kg)  Height: 5' 5 (1.651 m)   Body mass index is 30.45 kg/m.     02/08/2024   10:46 AM 07/22/2022   10:55 AM 01/02/2022   11:31 AM 11/28/2021   11:17 AM 11/20/2021    8:55 AM 07/15/2021   11:12 AM 03/02/2018    8:15 PM  Advanced Directives  Does Patient Have a Medical Advance Directive? No No No No No No   Would patient like information on creating a medical advance directive?  Yes (MAU/Ambulatory/Procedural Areas - Information given) No - Patient declined No - Patient declined  Yes (MAU/Ambulatory/Procedural Areas - Information given) No - Patient declined      Information is confidential and restricted. Go to Review Flowsheets to unlock data.    Current Medications (verified) Outpatient Encounter Medications as of 02/08/2024  Medication Sig   amLODipine  (NORVASC ) 5 MG tablet Take 1 tablet (5 mg total) by mouth daily.   atorvastatin  (LIPITOR) 10 MG tablet Take 1 tablet (10 mg total) by mouth at bedtime.   buPROPion  (WELLBUTRIN  XL) 150 MG 24 hr tablet Take 1 tablet (150 mg total) by mouth daily.   escitalopram  (LEXAPRO ) 20 MG tablet Take 1 tablet (20 mg total) by mouth daily.   famotidine  (PEPCID ) 20 MG tablet Take 1 tablet (20 mg total) by mouth 2 (two) times daily.   LORazepam  (ATIVAN ) 0.5 MG tablet Take 1 tablet (0.5 mg total) by mouth daily as needed for anxiety.   pantoprazole  (PROTONIX ) 40 MG tablet Take 1 tablet (40 mg total) by mouth daily.   No facility-administered encounter medications on file as of 02/08/2024.    Allergies (verified) Lisinopril    History: Past Medical History:  Diagnosis Date   Abdominal hernia    Anxiety    Arthritis    Cancer (HCC) 10/2021   cervical   Depression    Diverticulosis    GERD (gastroesophageal reflux disease)    Heart murmur    History of hiatal hernia    History of kidney stones    age 32   Hypertension    Liver mass    S/P resection  and partial hepatioma 2017 at [facility]   Substance abuse (HCC)    Ulcer of gastric fundus    Past Surgical History:  Procedure Laterality Date   ABDOMINAL HYSTERECTOMY     CERVICAL CONIZATION W/BX N/A 12/03/2021   Procedure: CONIZATION CERVIX WITH BIOPSY; ENDOCERVICAL BIOPSY;  Surgeon: Viktoria Comer SAUNDERS, MD;  Location: [facility];  Service: Gynecology;  Laterality: N/A;   CESAREAN SECTION     CHOLECYSTECTOMY  2017   COLONOSCOPY WITH ESOPHAGOGASTRODUODENOSCOPY (EGD)  08/2021   DILATATION & CURETTAGE/HYSTEROSCOPY WITH MYOSURE N/A 01/14/2022   Procedure: DILATATION &  CURETTAGE;  Surgeon: Viktoria Comer SAUNDERS, MD;  Location: [facility];  Service: Gynecology;  Laterality: N/A;   ECTOPIC PREGNANCY SURGERY     x2, both tubes removed   EYE SURGERY     OPEN PARTIAL HEPATECTOMY  Right 2017   right lobe benign tumor   ROBOTIC ASSISTED BILATERAL SALPINGO OOPHERECTOMY Bilateral 01/14/2022   Procedure: XI ROBOTIC ASSISTED BILATERAL SALPINGO OOPHORECTOMY;  Surgeon: Viktoria Comer SAUNDERS, MD;  Location: [facility];  Service: Gynecology;  Laterality: Bilateral;   ROBOTIC ASSISTED TOTAL HYSTERECTOMY N/A 01/14/2022   Procedure: XI ROBOTIC ASSISTED TOTAL HYSTERECTOMY, STAGING, PERITONEAL BIOPSIES, OMENTECTOMY, LYSIS OF ADHESIONS, PELVIC WASHINGS;  Surgeon: Viktoria Comer SAUNDERS, MD;  Location: [facility];  Service: Gynecology;  Laterality: N/A;   Family History  Problem Relation Age of Onset   Hypertension Mother    Colon cancer Father 87       Deceased age 84 from colon ca   Cancer Father    Social History   Socioeconomic History   Marital status: Married    Spouse name: Sandra   Number of children: 1   Years of education: 14   Highest education level: Associate degree: occupational, Scientist, product/process development, or vocational program  Occupational History   Not on file  Tobacco Use   Smoking status: Former    Current packs/day: 0.00    Average packs/day: 1 pack/day for 50.0 years (50.0 ttl pk-yrs)    Types: Cigarettes    Quit date: 10/31/2023    Years since quitting: 0.2   Smokeless tobacco: Never   Tobacco comments:    Trying hard to quit  Vaping Use   Vaping status: Never Used  Substance and Sexual Activity   Alcohol use: Yes    Alcohol/week: 3.0 standard drinks of alcohol    Types: 3 Shots of liquor per week    Comment: rare   Drug use: Yes    Frequency: 7.0 times per week    Types: Marijuana   Sexual activity: Not Currently    Birth control/protection: None  Other Topics Concern   Not on file  Social History Narrative   Not on file   Social Drivers of Health   Financial  Resource Strain: Medium Risk (02/08/2024)   Overall Financial Resource Strain (CARDIA)    Difficulty of Paying Living Expenses: Somewhat hard  Food Insecurity: No Food Insecurity (02/08/2024)   Hunger Vital Sign    Worried About Running Out of Food in the Last Year: Never true    Ran Out of Food in the Last Year: Never true  Transportation Needs: No Transportation Needs (02/08/2024)   PRAPARE - Administrator, Civil Service (Medical): No    Lack of Transportation (Non-Medical): No  Physical Activity: Insufficiently Active (02/08/2024)   Exercise Vital Sign    Days of Exercise per Week: 2 days    Minutes of Exercise per Session: 20 min  Stress: No  Stress Concern Present (02/08/2024)   [employer] of Occupational Health - Occupational Stress Questionnaire    Feeling of Stress: Not at all  Social Connections: Moderately Isolated (02/08/2024)   Social Connection and Isolation Panel    Frequency of Communication with Friends and Family: More than three times a week    Frequency of Social Gatherings with Friends and Family: Once a week    Attends Religious Services: Never    Database administrator or Organizations: No    Attends Engineer, structural: Never    Marital Status: Married    Tobacco Counseling Counseling given: Yes Tobacco comments: Trying hard to quit    Clinical Intake:  Pre-visit preparation completed: Yes  Pain : No/denies pain     BMI - recorded: 30.45 Nutritional Status: BMI > 30  Obese Nutritional Risks: None Diabetes: No  No results found for: HGBA1C   How often do you need to have someone help you when you read instructions, pamphlets, or other written materials from your doctor or pharmacy?: 1 - Never  Interpreter Needed?: No  Information entered by :: alia t/cma   Activities of Daily Living     02/07/2024   12:03 PM  In your present state of health, do you have any difficulty performing the following activities:   Hearing? 0  Vision? 0  Difficulty concentrating or making decisions? 0  Walking or climbing stairs? 0  Dressing or bathing? 0  Doing errands, shopping? 0  Preparing Food and eating ? N  Using the Toilet? N  In the past six months, have you accidently leaked urine? N  Do you have problems with loss of bowel control? N  Managing your Medications? N  Managing your Finances? N  Housekeeping or managing your Housekeeping? N    Patient Care Team: Gladis Mustard, FNP as PCP - General (Nurse Practitioner) Shaaron Lamar HERO, MD as Consulting Physician (Gastroenterology)  I have updated your Care Teams any recent Medical Services you may have received from other providers in the past year.     Assessment:   This is a routine wellness examination for Mariette.  Hearing/Vision screen Hearing Screening - Comments:: Pt denies hearing dif Vision Screening - Comments:: Pt denies vision dif/pt goes to [facility]. [facility] in [place] ov 2025   Goals Addressed   None    Depression Screen     02/08/2024   10:47 AM 12/27/2023   11:00 AM 06/28/2023   12:05 PM 01/05/2023   12:39 PM 07/22/2022   10:53 AM 07/02/2022   11:32 AM 04/23/2022   11:31 AM  PHQ 2/9 Scores  PHQ - 2 Score 1 2 2 3  0 0 2  PHQ- 9 Score 1 8 8 9   0 5    Fall Risk     02/07/2024   12:03 PM 12/27/2023   11:00 AM 06/28/2023   12:05 PM 01/05/2023   12:39 PM 07/22/2022   10:51 AM  Fall Risk   Falls in the past year? 0 0 0 0 0  Number falls in past yr:     0  Injury with Fall?     0  Risk for fall due to :     No Fall Risks  Follow up Falls evaluation completed;Education provided    Falls prevention discussed    MEDICARE RISK AT HOME:  Medicare Risk at Home Any stairs in or around the home?: (Patient-Rptd) Yes If so, are there any without handrails?: (Patient-Rptd) No  Home free of loose throw rugs in walkways, pet beds, electrical cords, etc?: (Patient-Rptd) Yes Adequate lighting in your home to  reduce risk of falls?: (Patient-Rptd) Yes Life alert?: (Patient-Rptd) No Use of a cane, walker or w/c?: (Patient-Rptd) No Grab bars in the bathroom?: (Patient-Rptd) No Shower chair or bench in shower?: (Patient-Rptd) No Elevated toilet seat or a handicapped toilet?: (Patient-Rptd) No  TIMED UP AND GO:  Was the test performed?    Cognitive Function: 6CIT completed    07/15/2021   11:28 AM  MMSE - Mini Mental State Exam  Orientation to time 4  Orientation to Place 5  Registration 3  Attention/ Calculation 5  Recall 3  Language- name 2 objects 2  Language- repeat 1  Language- follow 3 step command 3  Language- read & follow direction 1  Write a sentence 1  Copy design 1  Total score 29        07/22/2022   10:55 AM  6CIT Screen  What Year? 0 points  What month? 0 points  What time? 0 points  Count back from 20 0 points  Months in reverse 0 points  Repeat phrase 0 points  Total Score 0 points    Immunizations Immunization History  Administered Date(s) Administered   Fluad Quad(high Dose 65+) 07/02/2022   Fluad Trivalent(High Dose 65+) 06/28/2023   Influenza Split 07/04/2015   Influenza,inj,Quad PF,6+ Mos 03/23/2018   Moderna Sars-Covid-2 Vaccination 08/08/2019, 09/05/2019, 05/17/2020   PPD Test 07/15/2017   Tdap 01/19/2024   Zoster Recombinant(Shingrix) 01/19/2024    Screening Tests Health Maintenance  Topic Date Due   Hepatitis C Screening  Never done   DEXA SCAN  Never done   Lung Cancer Screening  12/10/2023   COVID-19 Vaccine (4 - 2025-26 season) 01/24/2024   Influenza Vaccine  08/22/2024 (Originally 12/24/2023)   Pneumococcal Vaccine: 50+ Years (1 of 2 - PCV) 12/26/2024 (Originally [birth year]1975)   Mammogram  12/26/2024 (Originally 07/17/2023)   Zoster Vaccines- Shingrix (2 of 2) 03/15/2024   Medicare Annual Wellness (AWV)  02/07/2025   Colonoscopy  09/30/2026   DTaP/Tdap/Td (2 - Td or Tdap) 01/18/2034   HPV VACCINES  Aged Out   Meningococcal B Vaccine   Aged Out    Health Maintenance Items Addressed: DEXA ordered & Lung cancer screening   Additional Screening:  Vision Screening: Recommended annual ophthalmology exams for early detection of glaucoma and other disorders of the eye. Is the patient up to date with their annual eye exam?  No  Who is the provider or what is the name of the office in which the patient attends annual eye exams? Dr. Madelyn at [facility] in [place]  Dental Screening: Recommended annual dental exams for proper oral hygiene  Community Resource Referral / Chronic Care Management: CRR required this visit?  No   CCM required this visit?  No   Plan:    I have personally reviewed and noted the following in the patient's chart:   Medical and social history Use of alcohol, tobacco or illicit drugs  Current medications and supplements including opioid prescriptions. Patient is not currently taking opioid prescriptions. Functional ability and status Nutritional status Physical activity Advanced directives List of other physicians Hospitalizations, surgeries, and ER visits in previous 12 months Vitals Screenings to include cognitive, depression, and falls Referrals and appointments  In addition, I have reviewed and discussed with patient certain preventive protocols, quality metrics, and best practice recommendations. A written personalized care plan for preventive services as well  as general preventive health recommendations were provided to patient.   Ozie Ned, CMA   02/08/2024   After Visit Summary: (MyChart) Due to this being a telephonic visit, the after visit summary with patients personalized plan was offered to patient via MyChart   Notes: Nothing significant to report at this time.

## 2024-02-08 NOTE — Patient Instructions (Signed)
 Theresa Reyes,  Thank you for taking the time for your Medicare Wellness Visit. I appreciate your continued commitment to your health goals. Please review the care plan we discussed, and feel free to reach out if I can assist you further.  Medicare recommends these wellness visits once per year to help you and your care team stay ahead of potential health issues. These visits are designed to focus on prevention, allowing your provider to concentrate on managing your acute and chronic conditions during your regular appointments.  Please note that Annual Wellness Visits do not include a physical exam. Some assessments may be limited, especially if the visit was conducted virtually. If needed, we may recommend a separate in-person follow-up with your provider.  Ongoing Care Seeing your primary care provider every 3 to 6 months helps us  monitor your health and provide consistent, personalized care.   Referrals If a referral was made during today's visit and you haven't received any updates within two weeks, please contact the referred provider directly to check on the status.  Recommended Screenings:  Health Maintenance  Topic Date Due   Hepatitis C Screening  Never done   DEXA scan (bone density measurement)  Never done   Medicare Annual Wellness Visit  07/23/2023   Screening for Lung Cancer  12/10/2023   COVID-19 Vaccine (4 - 2025-26 season) 01/24/2024   Flu Shot  08/22/2024*   Pneumococcal Vaccine for age over 66 (1 of 2 - PCV) 12/26/2024*   Breast Cancer Screening  12/26/2024*   Zoster (Shingles) Vaccine (2 of 2) 03/15/2024   Colon Cancer Screening  09/30/2026   DTaP/Tdap/Td vaccine (2 - Td or Tdap) 01/18/2034   HPV Vaccine  Aged Out   Meningitis B Vaccine  Aged Out  *Topic was postponed. The date shown is not the original due date.       02/08/2024   10:46 AM  Advanced Directives  Does Patient Have a Medical Advance Directive? No   Advance Care Planning is important because  it: Ensures you receive medical care that aligns with your values, goals, and preferences. Provides guidance to your family and loved ones, reducing the emotional burden of decision-making during critical moments.  Vision: Annual vision screenings are recommended for early detection of glaucoma, cataracts, and diabetic retinopathy. These exams can also reveal signs of chronic conditions such as diabetes and high blood pressure.  Dental: Annual dental screenings help detect early signs of oral cancer, gum disease, and other conditions linked to overall health, including heart disease and diabetes.  Please see the attached documents for additional preventive care recommendations.

## 2024-02-28 ENCOUNTER — Ambulatory Visit (INDEPENDENT_AMBULATORY_CARE_PROVIDER_SITE_OTHER)

## 2024-02-28 DIAGNOSIS — Z13820 Encounter for screening for osteoporosis: Secondary | ICD-10-CM

## 2024-02-28 DIAGNOSIS — Z0000 Encounter for general adult medical examination without abnormal findings: Secondary | ICD-10-CM

## 2024-02-28 DIAGNOSIS — Z780 Asymptomatic menopausal state: Secondary | ICD-10-CM

## 2024-03-01 DIAGNOSIS — Z780 Asymptomatic menopausal state: Secondary | ICD-10-CM | POA: Diagnosis not present

## 2024-03-01 DIAGNOSIS — M85852 Other specified disorders of bone density and structure, left thigh: Secondary | ICD-10-CM | POA: Diagnosis not present

## 2024-03-01 DIAGNOSIS — M85851 Other specified disorders of bone density and structure, right thigh: Secondary | ICD-10-CM | POA: Diagnosis not present

## 2024-03-06 ENCOUNTER — Ambulatory Visit: Payer: Self-pay | Admitting: Nurse Practitioner

## 2024-05-30 ENCOUNTER — Ambulatory Visit

## 2024-05-30 ENCOUNTER — Encounter: Payer: Self-pay | Admitting: Nurse Practitioner

## 2024-05-30 ENCOUNTER — Ambulatory Visit: Payer: Self-pay | Admitting: Nurse Practitioner

## 2024-05-30 VITALS — BP 158/80 | HR 59 | Temp 97.1°F | Ht 65.0 in | Wt 185.0 lb

## 2024-05-30 DIAGNOSIS — E876 Hypokalemia: Secondary | ICD-10-CM

## 2024-05-30 DIAGNOSIS — F3341 Major depressive disorder, recurrent, in partial remission: Secondary | ICD-10-CM

## 2024-05-30 DIAGNOSIS — E7800 Pure hypercholesterolemia, unspecified: Secondary | ICD-10-CM

## 2024-05-30 DIAGNOSIS — M79671 Pain in right foot: Secondary | ICD-10-CM

## 2024-05-30 DIAGNOSIS — F419 Anxiety disorder, unspecified: Secondary | ICD-10-CM

## 2024-05-30 DIAGNOSIS — K2100 Gastro-esophageal reflux disease with esophagitis, without bleeding: Secondary | ICD-10-CM

## 2024-05-30 DIAGNOSIS — M25562 Pain in left knee: Secondary | ICD-10-CM

## 2024-05-30 DIAGNOSIS — I10 Essential (primary) hypertension: Secondary | ICD-10-CM

## 2024-05-30 DIAGNOSIS — Z9181 History of falling: Secondary | ICD-10-CM

## 2024-05-30 DIAGNOSIS — Z23 Encounter for immunization: Secondary | ICD-10-CM

## 2024-05-30 DIAGNOSIS — I252 Old myocardial infarction: Secondary | ICD-10-CM

## 2024-05-30 DIAGNOSIS — Z6828 Body mass index (BMI) 28.0-28.9, adult: Secondary | ICD-10-CM

## 2024-05-30 DIAGNOSIS — M25571 Pain in right ankle and joints of right foot: Secondary | ICD-10-CM

## 2024-05-30 DIAGNOSIS — K5732 Diverticulitis of large intestine without perforation or abscess without bleeding: Secondary | ICD-10-CM

## 2024-05-30 LAB — CBC WITH DIFFERENTIAL/PLATELET
Basophils Absolute: 0 x10E3/uL (ref 0.0–0.2)
Basos: 0 %
EOS (ABSOLUTE): 0.1 x10E3/uL (ref 0.0–0.4)
Eos: 1 %
Hematocrit: 43.2 % (ref 34.0–46.6)
Hemoglobin: 13.9 g/dL (ref 11.1–15.9)
Immature Grans (Abs): 0 x10E3/uL (ref 0.0–0.1)
Immature Granulocytes: 0 %
Lymphocytes Absolute: 1.8 x10E3/uL (ref 0.7–3.1)
Lymphs: 14 %
MCH: 29.6 pg (ref 26.6–33.0)
MCHC: 32.2 g/dL (ref 31.5–35.7)
MCV: 92 fL (ref 79–97)
Monocytes Absolute: 0.7 x10E3/uL (ref 0.1–0.9)
Monocytes: 6 %
Neutrophils Absolute: 9.8 x10E3/uL — ABNORMAL HIGH (ref 1.4–7.0)
Neutrophils: 79 %
Platelets: 419 x10E3/uL (ref 150–450)
RBC: 4.69 x10E6/uL (ref 3.77–5.28)
RDW: 13.5 % (ref 11.7–15.4)
WBC: 12.4 x10E3/uL — ABNORMAL HIGH (ref 3.4–10.8)

## 2024-05-30 LAB — CMP14+EGFR
ALT: 21 IU/L (ref 0–32)
AST: 19 IU/L (ref 0–40)
Albumin: 4.6 g/dL (ref 3.9–4.9)
Alkaline Phosphatase: 86 IU/L (ref 49–135)
BUN/Creatinine Ratio: 17 (ref 12–28)
BUN: 15 mg/dL (ref 8–27)
Bilirubin Total: 0.5 mg/dL (ref 0.0–1.2)
CO2: 22 mmol/L (ref 20–29)
Calcium: 10.1 mg/dL (ref 8.7–10.3)
Chloride: 103 mmol/L (ref 96–106)
Creatinine, Ser: 0.86 mg/dL (ref 0.57–1.00)
Globulin, Total: 2.9 g/dL (ref 1.5–4.5)
Glucose: 96 mg/dL (ref 70–99)
Potassium: 5.1 mmol/L (ref 3.5–5.2)
Sodium: 140 mmol/L (ref 134–144)
Total Protein: 7.5 g/dL (ref 6.0–8.5)
eGFR: 74 mL/min/1.73

## 2024-05-30 LAB — LIPID PANEL
Chol/HDL Ratio: 2.8 ratio (ref 0.0–4.4)
Cholesterol, Total: 214 mg/dL — ABNORMAL HIGH (ref 100–199)
HDL: 77 mg/dL
LDL Chol Calc (NIH): 117 mg/dL — ABNORMAL HIGH (ref 0–99)
Triglycerides: 117 mg/dL (ref 0–149)
VLDL Cholesterol Cal: 20 mg/dL (ref 5–40)

## 2024-05-30 MED ORDER — LORAZEPAM 0.5 MG PO TABS: 0.5000 mg | ORAL_TABLET | Freq: Every day | ORAL | 5 refills | Status: AC | PRN

## 2024-05-30 MED ORDER — ATORVASTATIN CALCIUM 10 MG PO TABS: 10.0000 mg | ORAL_TABLET | Freq: Every day | ORAL | 1 refills | Status: AC

## 2024-05-30 MED ORDER — BUPROPION HCL ER (XL) 150 MG PO TB24: 150.0000 mg | ORAL_TABLET | Freq: Every day | ORAL | 1 refills | Status: AC

## 2024-05-30 MED ORDER — ESCITALOPRAM OXALATE 20 MG PO TABS: 20.0000 mg | ORAL_TABLET | Freq: Every day | ORAL | 1 refills | Status: AC

## 2024-05-30 MED ORDER — PANTOPRAZOLE SODIUM 40 MG PO TBEC: 40.0000 mg | DELAYED_RELEASE_TABLET | Freq: Every day | ORAL | 1 refills | Status: AC

## 2024-05-30 MED ORDER — AMLODIPINE BESYLATE 5 MG PO TABS: 5.0000 mg | ORAL_TABLET | Freq: Every day | ORAL | 1 refills | Status: AC

## 2024-05-30 MED ORDER — ARIPIPRAZOLE 2 MG PO TABS: 2.0000 mg | ORAL_TABLET | Freq: Every day | ORAL | 1 refills | Status: AC

## 2024-05-30 MED ORDER — FAMOTIDINE 20 MG PO TABS: 20.0000 mg | ORAL_TABLET | Freq: Two times a day (BID) | ORAL | 1 refills | Status: AC

## 2024-05-30 NOTE — Patient Instructions (Signed)
Aripiprazole Tablets What is this medication? ARIPIPRAZOLE (ay ri PIP ray zole) treats schizophrenia, bipolar I disorder, autism spectrum disorder, and Tourette disorder. It may also be used with antidepressant medications to treat depression. It works by balancing the levels of dopamine and serotonin in the brain, hormones that help regulate mood, behaviors, and thoughts. It belongs to a group of medications called antipsychotics. Antipsychotics can be used to treat several kinds of mental health conditions. This medicine may be used for other purposes; ask your health care provider or pharmacist if you have questions. COMMON BRAND NAME(S): Abilify What should I tell my care team before I take this medication? They need to know if you have any of these conditions: Dementia Diabetes Difficulty swallowing Have trouble controlling your muscles Heart disease History of irregular heartbeat History of stroke Low blood cell levels (white cells, red cells, and platelets) Low blood pressure Parkinson disease Seizures Suicidal thoughts, plans, or attempt by you or a family member Urges to engage in impulsive behaviors in ways that are unusual for you An unusual or allergic reaction to aripiprazole, other medications, foods, dyes, or preservatives Pregnant or trying to get pregnant Breastfeeding How should I use this medication? Take this medication by mouth with a glass of water. Take it as directed on the prescription label at the same time every day. You can take it with or without food. If it upsets your stomach, take it with food. Do not take your medication more often than directed. Keep taking it unless your care team tells you to stop. A special MedGuide will be given to you by the pharmacist with each prescription and refill. Be sure to read this information carefully each time. Talk to your care team about the use of this medication in children. While it may be prescribed for children as  young as 6 years for selected conditions, precautions do apply. Overdosage: If you think you have taken too much of this medicine contact a poison control center or emergency room at once. NOTE: This medicine is only for you. Do not share this medicine with others. What if I miss a dose? If you miss a dose, take it as soon as you can. If it is almost time for your next dose, take only that dose. Do not take double or extra doses. What may interact with this medication? Do not take this medication with any of the following: Brexpiprazole Cisapride Dextromethorphan; quinidine Dronedarone Metoclopramide Pimozide Quinidine Thioridazine This medication may also interact with the following: Antihistamines for allergy, cough, and cold Carbamazepine Certain medications for anxiety or sleep Certain medications for depression, such as amitriptyline, fluoxetine, paroxetine, or sertraline Certain medications for fungal infections, such as fluconazole, itraconazole, ketoconazole, posaconazole, or voriconazole Clarithromycin General anesthetics, such as halothane, isoflurane, methoxyflurane, or propofol Medications for Parkinson disease, such as levodopa Medications for blood pressure Medications for seizures Medications that relax muscles for surgery Opioid medications for pain Other medications that cause heart rhythm changes Phenothiazines, such as chlorpromazine or prochlorperazine Rifampin This list may not describe all possible interactions. Give your health care provider a list of all the medicines, herbs, non-prescription drugs, or dietary supplements you use. Also tell them if you smoke, drink alcohol, or use illegal drugs. Some items may interact with your medicine. What should I watch for while using this medication? Visit your care team for regular checks on your progress. Tell your care team if your symptoms do not start to get better or if they get worse. Do  not suddenly stop taking  this medication. You may develop a severe reaction. Your care team will tell you how much medication to take. If your care team wants you to stop the medication, the dose may be slowly lowered over time to avoid any side effects. Patients and their families should watch out for new or worsening depression or thoughts of suicide. Also watch out for sudden changes in feelings such as feeling anxious, agitated, panicky, irritable, hostile, aggressive, impulsive, severely restless, overly excited and hyperactive, or not being able to sleep. If this happens, especially at the beginning of antidepressant treatment or after a change in dose, call your care team. This medication may affect your coordination, reaction time, or judgment. Do not drive or operate machinery until you know how this medication affects you. Sit up or stand slowly to reduce the risk of dizzy or fainting spells. Drinking alcohol with this medication can increase the risk of these side effects. This medication can cause problems with controlling your body temperature. It can lower the response of your body to cold temperatures. If possible, stay indoors during cold weather. If you must go outdoors, wear warm clothes. It can also lower the response of your body to heat. Do not overheat. Do not over-exercise. Stay out of the sun when possible. If you must be in the sun, wear cool clothing. Drink plenty of water. If you have trouble controlling your body temperature, call your care team right away. This medication may cause dry eyes and blurred vision. If you wear contact lenses, you may feel some discomfort. Lubricating eye drops may help. See your care team if the problem does not go away or is severe. This medication may increase blood sugar. Ask your care team if changes in diet or medications are needed if you have diabetes. There have been reports of increased sexual urges or other strong urges such as gambling while taking this medication.  If you experience any of these while taking this medication, you should report this to your care team as soon as possible. What side effects may I notice from receiving this medication? Side effects that you should report to your care team as soon as possible: Allergic reactions--skin rash, itching, hives, swelling of the face, lips, tongue, or throat High blood sugar (hyperglycemia)--increased thirst or amount of urine, unusual weakness or fatigue, blurry vision High fever, stiff muscles, increased sweating, fast or irregular heartbeat, and confusion, which may be signs of neuroleptic malignant syndrome Low blood pressure--dizziness, feeling faint or lightheaded, blurry vision Pain or trouble swallowing Prolonged or painful erection Seizures Stroke--sudden numbness or weakness of the face, arm, or leg, trouble speaking, confusion, trouble walking, loss of balance or coordination, dizziness, severe headache, change in vision Uncontrolled and repetitive body movements, muscle stiffness or spasms, tremors or shaking, loss of balance or coordination, restlessness, shuffling walk, which may be signs of extrapyramidal symptoms (EPS) Thoughts of suicide or self-harm, worsening mood, feelings of depression Urges to engage in impulsive behaviors such as gambling, binge eating, sexual activity, or shopping in ways that are unusual for you Side effects that usually do not require medical attention (report these to your care team if they continue or are bothersome): Constipation Drowsiness Weight gain This list may not describe all possible side effects. Call your doctor for medical advice about side effects. You may report side effects to FDA at 1-800-FDA-1088. Where should I keep my medication? Keep out of the reach of children and pets. Store at room  temperature between 15 and 30 degrees C (59 and 86 degrees F). Throw away any unused medication after the expiration date. NOTE: This sheet is a summary.  It may not cover all possible information. If you have questions about this medicine, talk to your doctor, pharmacist, or health care provider.  2024 Elsevier/Gold Standard (2021-11-29 00:00:00)

## 2024-05-30 NOTE — Progress Notes (Signed)
 " History of   Subjective:    Patient ID: Theresa Reyes, female    DOB: 1955/08/07, 69 y.o.   MRN: [id number]   Chief Complaint: medical management of chronic issues     HPI:  Theresa Reyes is a 69 y.o. who identifies as a female who was assigned female at birth.   Social history: Lives with: husband and son Work history: retired   Water Engineer in today for follow up of the following chronic medical issues:  1. Essential hypertension No c/o chest pain, sob or headache. Does not check blood pressure at home. BP Readings from Last 3 Encounters:  02/08/24 (!) 142/78  12/27/23 (!) 142/78  06/28/23 129/78     2. Diverticulitis large intestine w/o perforation or abscess w/o bleeding No recent flare ups. Tries to avoid foods that cause flare up.  3. Gastroesophageal reflux disease with esophagitis without hemorrhage Is on protonix  and is doing well.  4. Anxiety Has cut xanax back to only a couple times a month     12/27/2023   11:01 AM 06/28/2023   12:05 PM 01/05/2023   12:40 PM 07/02/2022   11:33 AM  GAD 7 : Generalized Anxiety Score  Nervous, Anxious, on Edge 2 2 2  0  Control/stop worrying 1 1 2  0  Worry too much - different things 1 1 2  0  Trouble relaxing 1 1 2  0  Restless 0 1 1 0  Easily annoyed or irritable 0 2 2 0  Afraid - awful might happen 1 0 2 0  Total GAD 7 Score 6 8 13  0  Anxiety Difficulty Somewhat difficult Not difficult at all Somewhat difficult Not difficult at all     5. Recurrent major depressive disorder, in partial remission (HCC) We added wellbutrin  to lexapro . She says she is doing terrible. Cries all the time. No desire to do anything.      02/08/2024   10:47 AM 12/27/2023   11:00 AM 06/28/2023   12:05 PM  Depression screen PHQ 2/9  Decreased Interest 0 1 1  Down, Depressed, Hopeless 1 1 1   PHQ - 2 Score 1 2 2   Altered sleeping 0 1 1  Tired, decreased energy 0 2 1  Change in appetite 0 0 1  Feeling bad or failure about yourself  0 1 0  Trouble  concentrating 0 2 2  Moving slowly or fidgety/restless 0 0 1  Suicidal thoughts 0 0 0  PHQ-9 Score 1  8  8    Difficult doing work/chores Not difficult at all Somewhat difficult Not difficult at all     Data saved with a previous flowsheet row definition        6. Hypokalemia No c/o muscle cramping Lab Results  Component Value Date   K 5.4 (H) 12/27/2023    7. History of Non-ST elevation MI (NSTEMI) (HCC) See hospital note below  8. BMI 29.0-29.9,adult No recent weight changes  Wt Readings from Last 3 Encounters:  05/30/24 185 lb (83.9 kg)  02/08/24 183 lb (83 kg)  12/27/23 183 lb (83 kg)   BMI Readings from Last 3 Encounters:  05/30/24 30.79 kg/m  02/08/24 30.45 kg/m  12/27/23 30.45 kg/m     New complaints: Patient feel December 20th  and injured her right ankle and left knee. Painful to walk on.   Allergies  Allergen Reactions   Lisinopril      Vertigo   Outpatient Encounter Medications as of 05/30/2024  Medication Sig   amLODipine  (NORVASC ) 5  MG tablet Take 1 tablet (5 mg total) by mouth daily.   atorvastatin  (LIPITOR) 10 MG tablet Take 1 tablet (10 mg total) by mouth at bedtime.   buPROPion  (WELLBUTRIN  XL) 150 MG 24 hr tablet Take 1 tablet (150 mg total) by mouth daily.   escitalopram  (LEXAPRO ) 20 MG tablet Take 1 tablet (20 mg total) by mouth daily.   famotidine  (PEPCID ) 20 MG tablet Take 1 tablet (20 mg total) by mouth 2 (two) times daily.   LORazepam  (ATIVAN ) 0.5 MG tablet Take 1 tablet (0.5 mg total) by mouth daily as needed for anxiety.   pantoprazole  (PROTONIX ) 40 MG tablet Take 1 tablet (40 mg total) by mouth daily.   No facility-administered encounter medications on file as of 05/30/2024.    Past Surgical History:  Procedure Laterality Date   ABDOMINAL HYSTERECTOMY     CERVICAL CONIZATION W/BX N/A 12/03/2021   Procedure: CONIZATION CERVIX WITH BIOPSY; ENDOCERVICAL BIOPSY;  Surgeon: Viktoria Comer SAUNDERS, MD;  Location: [facility];  Service:  Gynecology;  Laterality: N/A;   CESAREAN SECTION     CHOLECYSTECTOMY  2017   COLONOSCOPY WITH ESOPHAGOGASTRODUODENOSCOPY (EGD)  08/2021   DILATATION & CURETTAGE/HYSTEROSCOPY WITH MYOSURE N/A 01/14/2022   Procedure: DILATATION & CURETTAGE;  Surgeon: Viktoria Comer SAUNDERS, MD;  Location: [facility];  Service: Gynecology;  Laterality: N/A;   ECTOPIC PREGNANCY SURGERY     x2, both tubes removed   EYE SURGERY     OPEN PARTIAL HEPATECTOMY  Right 2017   right lobe benign tumor   ROBOTIC ASSISTED BILATERAL SALPINGO OOPHERECTOMY Bilateral 01/14/2022   Procedure: XI ROBOTIC ASSISTED BILATERAL SALPINGO OOPHORECTOMY;  Surgeon: Viktoria Comer SAUNDERS, MD;  Location: [facility];  Service: Gynecology;  Laterality: Bilateral;   ROBOTIC ASSISTED TOTAL HYSTERECTOMY N/A 01/14/2022   Procedure: XI ROBOTIC ASSISTED TOTAL HYSTERECTOMY, STAGING, PERITONEAL BIOPSIES, OMENTECTOMY, LYSIS OF ADHESIONS, PELVIC WASHINGS;  Surgeon: Viktoria Comer SAUNDERS, MD;  Location: [facility];  Service: Gynecology;  Laterality: N/A;    Family History  Problem Relation Age of Onset   Hypertension Mother    Colon cancer Father 71       Deceased age 45 from colon ca   Cancer Father       Controlled substance contract: n/a     Review of Systems  Constitutional:  Negative for diaphoresis.  Eyes:  Negative for pain.  Respiratory:  Negative for shortness of breath.   Cardiovascular:  Negative for chest pain, palpitations and leg swelling.  Gastrointestinal:  Negative for abdominal pain.  Endocrine: Negative for polydipsia.  Skin:  Negative for rash.  Neurological:  Negative for dizziness, weakness and headaches.  Hematological:  Does not bruise/bleed easily.  All other systems reviewed and are negative.      Objective:   Physical Exam Vitals and nursing note reviewed.  Constitutional:      General: She is not in acute distress.    Appearance: Normal appearance. She is well-developed.  HENT:     Head: Normocephalic.     Right Ear:  Tympanic membrane normal.     Left Ear: Tympanic membrane normal.     Nose: Nose normal.     Mouth/Throat:     Mouth: Mucous membranes are moist.  Eyes:     Pupils: Pupils are equal, round, and reactive to light.  Neck:     Vascular: No carotid bruit or JVD.  Cardiovascular:     Rate and Rhythm: Normal rate and regular rhythm.     Heart sounds: Normal heart  sounds.  Pulmonary:     Effort: Pulmonary effort is normal. No respiratory distress.     Breath sounds: Normal breath sounds. No wheezing or rales.  Chest:     Chest wall: No tenderness.  Abdominal:     General: Bowel sounds are normal. There is no distension or abdominal bruit.     Palpations: Abdomen is soft. There is no hepatomegaly, splenomegaly, mass or pulsatile mass.     Tenderness: There is no abdominal tenderness.  Musculoskeletal:        General: Normal range of motion.     Cervical back: Normal range of motion and neck supple.     Comments: FROM of left knee with pain on full extension No patella tenderness FROM of right ankle with no pain- no edema  Lymphadenopathy:     Cervical: No cervical adenopathy.  Skin:    General: Skin is warm and dry.  Neurological:     Mental Status: She is alert and oriented to person, place, and time.     Deep Tendon Reflexes: Reflexes are normal and symmetric.  Psychiatric:        Behavior: Behavior normal.        Thought Content: Thought content normal.        Judgment: Judgment normal.    BP (!) 158/80   Pulse (!) 59   Temp (!) 97.1 F (36.2 C) (Temporal)   Ht 5' 5 (1.651 m)   Wt 185 lb (83.9 kg)   SpO2 98%   BMI 30.79 kg/m    Left knee xray- osteoarthritis-Preliminary reading by Ronal Lunger, FNP  [facility]  Right ankle- normal-Preliminary reading by Ronal Lunger, FNP  [facility]      Assessment & Plan:  Theresa Reyes comes in today with chief complaint of Medical Management of Chronic Issues   Diagnosis and orders addressed:  1. Essential hypertension (Primary) Dash  diet - amLODipine  (NORVASC ) 5 MG tablet; Take 1 tablet (5 mg total) by mouth daily.  Dispense: 90 tablet; Refill: 1 - CBC with Differential/Platelet - CMP14+EGFR  2. Diverticulitis large intestine w/o perforation or abscess w/o bleeding Watch diet to prevent flare up  3. Anxiety Stress mnaement - LORazepam  (ATIVAN ) 0.5 MG tablet; Take 1 tablet (0.5 mg total) by mouth daily as needed for anxiety.  Dispense: 30 tablet; Refill: 5  4. Recurrent major depressive disorder, in partial remission Added abilify  to current meds- may take a few days to be effective - ARIPiprazole  (ABILIFY ) 2 MG tablet; Take 1 tablet (2 mg total) by mouth daily.  Dispense: 90 tablet; Refill: 1 - buPROPion  (WELLBUTRIN  XL) 150 MG 24 hr tablet; Take 1 tablet (150 mg total) by mouth daily.  Dispense: 90 tablet; Refill: 1 - escitalopram  (LEXAPRO ) 20 MG tablet; Take 1 tablet (20 mg total) by mouth daily.  Dispense: 90 tablet; Refill: 1  5. Hypokalemia Labs pending  6. History of non-ST elevation myocardial infarction (NSTEMI) Keep yearly follow up with crdiology  7. BMI 28.0-28.9,adult Discussed diet and exercise for person with BMI >25 Will recheck weight in 3-6 months  8. Acute pain of left knee Referral  to ortho - DG Knee 1-2 Views Left  9. Acute right ankle pain Elevate and ice - DG Ankle Complete Right  10. Gastroesophageal reflux disease with esophagitis without hemorrhage Avoid spicy foods Do not eat 2 hours prior to bedtime  - famotidine  (PEPCID ) 20 MG tablet; Take 1 tablet (20 mg total) by mouth 2 (two) times daily.  Dispense: 180  tablet; Refill: 1 - pantoprazole  (PROTONIX ) 40 MG tablet; Take 1 tablet (40 mg total) by mouth daily.  Dispense: 90 tablet; Refill: 1  11. Pure hypercholesterolemia low fat diet - atorvastatin  (LIPITOR) 10 MG tablet; Take 1 tablet (10 mg total) by mouth at bedtime.  Dispense: 90 tablet; Refill: 1 - Lipid panel  12. Encounter for immunization - Flu vaccine HIGH DOSE  PF(Fluzone Trivalent)   Labs pending Health Maintenance reviewed Diet and exercise encouraged  Follow up plan: 3 months  45 minutes spent with patient  Mary-Margaret Gladis, FNP  "

## 2024-05-31 ENCOUNTER — Ambulatory Visit: Payer: Self-pay | Admitting: Nurse Practitioner

## 2024-08-18 ENCOUNTER — Ambulatory Visit: Admitting: Nurse Practitioner

## 2025-02-08 ENCOUNTER — Ambulatory Visit: Payer: Self-pay
